# Patient Record
Sex: Female | Born: 1975 | Hispanic: Yes | Marital: Single | Smoking: Never smoker
Health system: Southern US, Community
[De-identification: ages and names within clinical notes are randomized; demographics above are authoritative.]

## PROBLEM LIST (undated history)

## (undated) DIAGNOSIS — F419 Anxiety disorder, unspecified: Secondary | ICD-10-CM

## (undated) DIAGNOSIS — M1990 Unspecified osteoarthritis, unspecified site: Secondary | ICD-10-CM

## (undated) DIAGNOSIS — I499 Cardiac arrhythmia, unspecified: Secondary | ICD-10-CM

## (undated) DIAGNOSIS — I4891 Unspecified atrial fibrillation: Secondary | ICD-10-CM

## (undated) DIAGNOSIS — F32A Depression, unspecified: Secondary | ICD-10-CM

## (undated) DIAGNOSIS — C801 Malignant (primary) neoplasm, unspecified: Secondary | ICD-10-CM

## (undated) HISTORY — PX: BREAST SURGERY: SHX581

## (undated) NOTE — *Deleted (*Deleted)
19 year old married female, resident of [place], [place], with PMH of A. fib/SVT, s/p ablations, not on anticoagulation, breast cancer on hormonal therapy with Aromasin and Lupron, rheumatoid arthritis on regular NSAIDs, presented to the [facility] emergency department on 02/18/2020 due to abrupt onset of syncope and confusion/altered mental status. On day of the event, she had not eaten a lot and was really hungry so she went to cook.  She reported a sense of nervousness which wass not unusual for her when she is hungry.  She endorsed that this sense of nervousness kind of grew more and the next thing she knows is waking up in the hospital.  Husband reported that he heard her singing and it is not unusual for her to sing.  However, her voice was really loud and it kept on getting louder and louder almost to the point of yelling.  Patient was heard rambling, talking that did not make any sense and then around 6 PM, he heard a loud thud and both he and the son found her on the ground passed out.  She was unconscious for about 2 minutes, they did not see any shaking or jerking concerning for seizures.  She gradually started coming back but she was very disoriented and lethargic afterwards.  He offered her some food to eat and when she continued to be lethargic, he called EMS.  She did not have any bowel or bladder incontinence during this event however, she did lose control of her bladder a couple times in the hospital when she was lethargic and tired.  Upon arrival to the ED, she reported to the [facility] physician regarding vertigo/room spinning around her, nausea and headache on the right side of her head which was throbbing, constant and rated at 8/10 in severity.  She had vomited a couple times prior to admission and had urinary incontinence.  Stroke code was initiated by EDP.  Neurology consulted.  CT of the head and CTA of the head and neck were without acute findings.  Her mental  status improved may be after 4 hours.  She was given IV Toradol and Reglan for headache, IV fluids and meclizine for vertigo.  Neurology recommended metabolic work-up and MRI of the brain.  Work-up in the ED showed stable vital signs, CMP remarkable for glucose 67 and CBC for white cell count of 3.9.  COVID-19 and flu panel PCR were negative.  Blood alcohol level was less than 10.  EKG showed sinus rhythm.  She was admitted with a diagnosis of syncope and collapse, metabolic encephalopathy, A. fib and headache, for further evaluation and management.  Following was the evaluation and management done during course of her hospitalization:  Syncope and collapse: Etiology unclear.  No evidence to suggest cardiogenic etiology.  Echocardiogram and EKG unremarkable.  Telemetry showed sinus rhythm.  Neurology followed up and felt that there were certain aspects of the semiology of this event that were more concerning for a seizure including possible prodrome of overwhelming nervousness, prolonged postictal confusion.  But she had also not eaten much that day and husband reported some improvement in cognition after he gave her some food after the episode and that she was very hungry.  MRI brain and routine EEG were negative for any structural abnormality or for any epileptogenic discharges.  Since there is suspicion for her having a seizure in the future was low, they recommended holding off on starting her on antiepileptic drugs at this time.  Given her risk factors  though including significant family history of epilepsy, episodes of staring off as a kid that had not been worked up, they felt that she would benefit from seeing a neurologist as outpatient.  They also recommended discussing with her cardiologist and oncologist about taking anticoagulation for stroke prevention from A. fib and advised no driving for 6 months. TSH and cortisol unremarkable.  Towards the later part of her hospitalization, she was treated for  possible orthostatic hypotension related brief syncope, with IV fluids and reducing dose of propranolol for her A. fib.  With better control of her frontal headaches, dizziness with multiple medication adjustments as noted below, she was able to walk better with therapies, clinically improved and eventually discharged home on 02/29/2020  Vertigo: Etiology unclear.  Suspected to be peripheral.  Imaging unremarkable.  PT evaluated.  Patient had difficulty participating in therapy evaluation due to vertigo.  Trial of meclizine initiated.  She had ongoing issues with severe vertigo, poor appetite and headache.  She had positional vertigo, when she was stationary she did not have dizziness but the moment she sat up, or turned side-to-side, she started having significant vertigo, ongoing frontal headache 7/10 in severity with significant phonophobia and photophobia (had her room dark).  Meclizine did not seem to help.  She had ongoing issues participating in therapies evaluation due to severe dizziness and headache.  Acute encephalopathy: Etiology unclear.  Metabolic and imaging work-up unremarkable.  Could not rule out postictal phenomena.  Resolved..  Paroxysmal atrial fibrillation: Telemetry showed normal sinus rhythm.  She was continued on home dose of propranolol.  Although blood pressures were soft at times, she did not have consistent orthostatic hypotension  Headache: Imaging negative.  No concerning features.  Treated with analgesics.  She required periodic doses of IV ketorolac and Reglan.  She was treated with a course of Augmentin empirically for sinusitis.  Her headache had some migrainous etiology.  She had hardly any response to a cocktail of Toradol/Reglan/Benadryl and no response to IV Decadron either. Due to refractory headache, after discussing with neurology, CT venogram of the brain obtained and ruled out dural sinus venous thrombosis.  As per neurology recommendations, she was then treated  with couple of days of IV Depacon and IV Solu-Medrol along with Topamax at night.  Neurology formally consulted again on 02/26/2020 and were concerned about new migraines and recommended Thorazine x1 day, sumatriptan subcutaneous, Compazine, continue Ambien and trazodone for insomnia, continue meclizine as needed, increased Topamax.  She had another brief episode of lightheadedness and passing out on 02/27/2020 without orthostatic hypotension but did have some orthostatic drop in heart rates.  Trazodone was discontinued.  Headache improved  2.7 cm left thyroid nodule: Recommended thyroid ultrasound and follow-up as outpatient.  TSH was normal.  Heterogenous appearance of the salivary gland: With numerous punctate parotid calcifications which may reflect an underlying inflammatory or autoimmune condition such as Sjogren's disease.  Outpatient follow-up.  Constipation: No concerning features.  Bowel regimen initiated with good effect.  History of breast cancer: S/p double mastectomy, maintained on Aromasin and Lupron.  Apparently had not been taking Lupron in the past several months.  Plan was to establish with outpatient oncology as patient had just moved into this area from [place].   Several days after patient was discharged from the hospital, her insurance has denied this hospital admission.  As per denial fax, "The service requested in the [place] is available in [place] therefore, it is denied as established in your health  coverage".  There was no option to do up peer to peer discussion with the Manufacturing engineer.  The only option available was to write a written appeal.  This is a written appeal requesting the insurance company to review patient's most recent hospitalization as detailed above and strongly recommend overturning the insurance denial and approving inpatient admission which was most appropriate for reasons outlined below:   1. Patient is currently a  resident of [place], [place] and not [place] as indicated in the denial fax. 2. Patient voluntarily presented to the local emergency department at the [facility] in [place], [place] seeking medical care for her severe medical conditions. 3. She was appropriately evaluated in a timely fashion in the ED and hospitalized for further evaluation and management of her several serious medical problems as detailed above.  She clearly met medical intensity for inpatient hospitalization. 4. Refusing to provide medical care to her obviously was not an option.  This would have clearly violated EMTALA.

---

## 2015-05-06 HISTORY — PX: BREAST SURGERY: SHX581

## 2017-05-05 HISTORY — PX: ABLATION: SHX5711

## 2019-01-21 ENCOUNTER — Telehealth (HOSPITAL_BASED_OUTPATIENT_CLINIC_OR_DEPARTMENT_OTHER): Payer: Self-pay

## 2019-01-21 NOTE — Telephone Encounter (Signed)
New pt is interested in scheduling a consult with BREAST MED ONC for her Dx of breast cancer. I informed her to have her Dr's office fax over medical records/ referral to [phone]. Please keep an eye out for documents and schedule when ready. Thank you.

## 2019-01-28 NOTE — Telephone Encounter (Signed)
No records have been received as of 01/28/19.  Also, per pt's registration, pt has a Medicare HMO ([employer] With billing address in [place]).  I have made several attempts to reach someone in [employer] office to verify if we accept pt's plan.  Verified on 01/28/19 that [facility] is not contracted with this Medicare HMO plan.    Call placed to pt to discuss the above information, but no answer.  Left vm requesting call back from pt to discuss further.  Ph number given to pt.

## 2019-04-18 ENCOUNTER — Encounter (HOSPITAL_BASED_OUTPATIENT_CLINIC_OR_DEPARTMENT_OTHER): Payer: Self-pay | Admitting: Surgical Oncology

## 2019-04-18 ENCOUNTER — Telehealth (HOSPITAL_BASED_OUTPATIENT_CLINIC_OR_DEPARTMENT_OTHER): Payer: Self-pay

## 2019-04-18 NOTE — Telephone Encounter (Signed)
Referral received for breast carcinoma, patient has had mastectomy.     Referral scanned into media.     Patient being directed to med onc

## 2019-04-19 NOTE — Telephone Encounter (Signed)
Patient has been referred by  PCP- [facility]  to breast medical oncology.    DX: Hx of Breast Cancer    Ref Date: 04/18/19    Authorization: *NO AUTH INCLUDED*      *2ND REFERRAL/NEW PATIENT ENCOUNTER:    Previous encounter started on 01/21/19.  Previous issue with scheduling was due to patient's insurance.  Patient stil has [employer] HMO (Medicare HMO with billing address in [place]).  Per [employer] Dept, we are not contracted with this insurance.    Pt also enrolled with [employer], but Medicare HMO plan is Primary.    Call placed to pt to discuss referral and current insurance, but no answer from pt.  Left vm request call back to discuss referral details.  Ph number provided.

## 2020-02-18 ENCOUNTER — Inpatient Hospital Stay (HOSPITAL_COMMUNITY)
Admission: EM | Admit: 2020-02-18 | Discharge: 2020-02-29 | DRG: 312 | Disposition: A | Discharge status: Home / Self Care | Payer: Medicare (Managed Care) | Attending: Internal Medicine | Admitting: Internal Medicine

## 2020-02-18 ENCOUNTER — Emergency Department (HOSPITAL_COMMUNITY): Payer: Medicare (Managed Care)

## 2020-02-18 ENCOUNTER — Encounter (HOSPITAL_COMMUNITY): Payer: Self-pay

## 2020-02-18 ENCOUNTER — Other Ambulatory Visit: Payer: Self-pay

## 2020-02-18 DIAGNOSIS — E041 Nontoxic single thyroid nodule: Secondary | ICD-10-CM | POA: Diagnosis present

## 2020-02-18 DIAGNOSIS — G9340 Encephalopathy, unspecified: Secondary | ICD-10-CM | POA: Diagnosis not present

## 2020-02-18 DIAGNOSIS — M1990 Unspecified osteoarthritis, unspecified site: Secondary | ICD-10-CM | POA: Diagnosis present

## 2020-02-18 DIAGNOSIS — R569 Unspecified convulsions: Secondary | ICD-10-CM | POA: Diagnosis present

## 2020-02-18 DIAGNOSIS — E860 Dehydration: Secondary | ICD-10-CM | POA: Diagnosis present

## 2020-02-18 DIAGNOSIS — R911 Solitary pulmonary nodule: Secondary | ICD-10-CM | POA: Diagnosis present

## 2020-02-18 DIAGNOSIS — M069 Rheumatoid arthritis, unspecified: Secondary | ICD-10-CM | POA: Diagnosis present

## 2020-02-18 DIAGNOSIS — C50919 Malignant neoplasm of unspecified site of unspecified female breast: Secondary | ICD-10-CM | POA: Diagnosis present

## 2020-02-18 DIAGNOSIS — K5900 Constipation, unspecified: Secondary | ICD-10-CM | POA: Diagnosis present

## 2020-02-18 DIAGNOSIS — R42 Dizziness and giddiness: Secondary | ICD-10-CM | POA: Diagnosis not present

## 2020-02-18 DIAGNOSIS — Z79811 Long term (current) use of aromatase inhibitors: Secondary | ICD-10-CM | POA: Diagnosis not present

## 2020-02-18 DIAGNOSIS — I4891 Unspecified atrial fibrillation: Secondary | ICD-10-CM

## 2020-02-18 DIAGNOSIS — R410 Disorientation, unspecified: Secondary | ICD-10-CM | POA: Diagnosis not present

## 2020-02-18 DIAGNOSIS — Z9181 History of falling: Secondary | ICD-10-CM

## 2020-02-18 DIAGNOSIS — F419 Anxiety disorder, unspecified: Secondary | ICD-10-CM | POA: Diagnosis not present

## 2020-02-18 DIAGNOSIS — Z20822 Contact with and (suspected) exposure to covid-19: Secondary | ICD-10-CM | POA: Diagnosis present

## 2020-02-18 DIAGNOSIS — R519 Headache, unspecified: Secondary | ICD-10-CM

## 2020-02-18 DIAGNOSIS — G4489 Other headache syndrome: Secondary | ICD-10-CM | POA: Diagnosis not present

## 2020-02-18 DIAGNOSIS — R55 Syncope and collapse: Secondary | ICD-10-CM | POA: Diagnosis not present

## 2020-02-18 DIAGNOSIS — G4700 Insomnia, unspecified: Secondary | ICD-10-CM | POA: Diagnosis present

## 2020-02-18 DIAGNOSIS — R4182 Altered mental status, unspecified: Secondary | ICD-10-CM

## 2020-02-18 DIAGNOSIS — Z9013 Acquired absence of bilateral breasts and nipples: Secondary | ICD-10-CM | POA: Diagnosis not present

## 2020-02-18 DIAGNOSIS — I951 Orthostatic hypotension: Principal | ICD-10-CM | POA: Diagnosis present

## 2020-02-18 DIAGNOSIS — G9341 Metabolic encephalopathy: Secondary | ICD-10-CM | POA: Diagnosis present

## 2020-02-18 DIAGNOSIS — I480 Paroxysmal atrial fibrillation: Secondary | ICD-10-CM | POA: Diagnosis present

## 2020-02-18 DIAGNOSIS — R32 Unspecified urinary incontinence: Secondary | ICD-10-CM | POA: Diagnosis present

## 2020-02-18 DIAGNOSIS — Z820 Family history of epilepsy and other diseases of the nervous system: Secondary | ICD-10-CM | POA: Diagnosis not present

## 2020-02-18 DIAGNOSIS — Z79899 Other long term (current) drug therapy: Secondary | ICD-10-CM | POA: Diagnosis not present

## 2020-02-18 HISTORY — DX: Malignant (primary) neoplasm, unspecified: C80.1

## 2020-02-18 HISTORY — DX: Unspecified osteoarthritis, unspecified site: M19.90

## 2020-02-18 HISTORY — DX: Unspecified atrial fibrillation: I48.91

## 2020-02-18 LAB — CBC
HCT: 41.4 % (ref 36.0–46.0)
Hemoglobin: 13.5 g/dL (ref 12.0–15.0)
MCH: 26.8 pg (ref 26.0–34.0)
MCHC: 32.6 g/dL (ref 30.0–36.0)
MCV: 82.3 fL (ref 80.0–100.0)
Platelets: 212 10*3/uL (ref 150–400)
RBC: 5.03 MIL/uL (ref 3.87–5.11)
RDW: 13.1 % (ref 11.5–15.5)
WBC: 3.9 10*3/uL — ABNORMAL LOW (ref 4.0–10.5)
nRBC: 0 % (ref 0.0–0.2)

## 2020-02-18 LAB — RESPIRATORY PANEL BY RT PCR (FLU A&B, COVID)
Influenza A by PCR: NEGATIVE
Influenza B by PCR: NEGATIVE
SARS Coronavirus 2 by RT PCR: NEGATIVE

## 2020-02-18 LAB — COMPREHENSIVE METABOLIC PANEL
ALT: 17 U/L (ref 0–44)
AST: 26 U/L (ref 15–41)
Albumin: 4.2 g/dL (ref 3.5–5.0)
Alkaline Phosphatase: 54 U/L (ref 38–126)
Anion gap: 6 (ref 5–15)
BUN: 12 mg/dL (ref 6–20)
CO2: 26 mmol/L (ref 22–32)
Calcium: 9.1 mg/dL (ref 8.9–10.3)
Chloride: 103 mmol/L (ref 98–111)
Creatinine, Ser: 1 mg/dL (ref 0.44–1.00)
GFR, Estimated: >60 mL/min (ref >60)
Glucose, Bld: 67 mg/dL — ABNORMAL LOW (ref 70–99)
Potassium: 3.5 mmol/L (ref 3.5–5.1)
Sodium: 135 mmol/L (ref 135–145)
Total Bilirubin: 0.6 mg/dL (ref 0.3–1.2)
Total Protein: 9.2 g/dL — ABNORMAL HIGH (ref 6.5–8.1)

## 2020-02-18 LAB — DIFFERENTIAL
Abs Immature Granulocytes: 0.01 10*3/uL (ref 0.00–0.07)
Basophils Absolute: 0 10*3/uL (ref 0.0–0.1)
Basophils Relative: 1 %
Eosinophils Absolute: 0 10*3/uL (ref 0.0–0.5)
Eosinophils Relative: 1 %
Immature Granulocytes: 0 %
Lymphocytes Relative: 39 %
Lymphs Abs: 1.5 10*3/uL (ref 0.7–4.0)
Monocytes Absolute: 0.3 10*3/uL (ref 0.1–1.0)
Monocytes Relative: 8 %
Neutro Abs: 2 10*3/uL (ref 1.7–7.7)
Neutrophils Relative %: 51 %

## 2020-02-18 LAB — ETHANOL: Alcohol, Ethyl (B): <10 mg/dL (ref <10)

## 2020-02-18 LAB — CBG MONITORING, ED: Glucose-Capillary: 93 mg/dL (ref 70–99)

## 2020-02-18 LAB — PROTIME-INR
INR: 1 (ref 0.8–1.2)
Prothrombin Time: 13 seconds (ref 11.4–15.2)

## 2020-02-18 LAB — APTT: aPTT: 26 seconds (ref 24–36)

## 2020-02-18 MED ORDER — METOCLOPRAMIDE HCL 5 MG/ML IJ SOLN
10.0000 mg | Freq: Once | INTRAMUSCULAR | Status: AC
  Administered 2020-02-18: 10 mg via INTRAVENOUS
  Filled 2020-02-18: qty 2

## 2020-02-18 MED ORDER — PROPRANOLOL HCL 40 MG PO TABS
40.0000 mg | ORAL_TABLET | Freq: Every day | ORAL | Status: DC
  Administered 2020-02-20 – 2020-02-24 (×5): 40 mg via ORAL
  Filled 2020-02-18 (×6): qty 1

## 2020-02-18 MED ORDER — KETOROLAC TROMETHAMINE 15 MG/ML IJ SOLN
15.0000 mg | Freq: Once | INTRAMUSCULAR | Status: AC
  Administered 2020-02-18: 15 mg via INTRAVENOUS
  Filled 2020-02-18: qty 1

## 2020-02-18 MED ORDER — ONDANSETRON HCL 4 MG/2ML IJ SOLN
4.0000 mg | Freq: Three times a day (TID) | INTRAMUSCULAR | Status: DC | PRN
  Administered 2020-02-19 – 2020-02-23 (×4): 4 mg via INTRAVENOUS
  Filled 2020-02-18 (×4): qty 2

## 2020-02-18 MED ORDER — BUSPIRONE HCL 15 MG PO TABS
7.5000 mg | ORAL_TABLET | Freq: Every day | ORAL | Status: DC
  Administered 2020-02-19 – 2020-02-29 (×11): 7.5 mg via ORAL
  Filled 2020-02-18 (×2): qty 1
  Filled 2020-02-18: qty 2
  Filled 2020-02-18 (×8): qty 1

## 2020-02-18 MED ORDER — ACETAMINOPHEN 650 MG RE SUPP: 650.0000 mg | RECTAL | Status: DC | PRN

## 2020-02-18 MED ORDER — EXEMESTANE 25 MG PO TABS
25.0000 mg | ORAL_TABLET | Freq: Every day | ORAL | Status: DC
  Administered 2020-02-19 – 2020-02-29 (×11): 25 mg via ORAL
  Filled 2020-02-18 (×14): qty 1

## 2020-02-18 MED ORDER — HEPARIN SODIUM (PORCINE) 5000 UNIT/ML IJ SOLN
5000.0000 [IU] | Freq: Three times a day (TID) | INTRAMUSCULAR | Status: DC
  Administered 2020-02-19 – 2020-02-29 (×30): 5000 [IU] via SUBCUTANEOUS
  Filled 2020-02-18 (×30): qty 1

## 2020-02-18 MED ORDER — SODIUM CHLORIDE 0.9 % IV BOLUS
500.0000 mL | Freq: Once | INTRAVENOUS | Status: AC
  Administered 2020-02-18: 500 mL via INTRAVENOUS

## 2020-02-18 MED ORDER — SODIUM CHLORIDE 0.9 % IV SOLN: INTRAVENOUS | Status: DC

## 2020-02-18 MED ORDER — ACETAMINOPHEN 160 MG/5ML PO SOLN: 650.0000 mg | ORAL | Status: DC | PRN

## 2020-02-18 MED ORDER — TRAZODONE HCL 100 MG PO TABS
100.0000 mg | ORAL_TABLET | Freq: Every day | ORAL | Status: DC
  Administered 2020-02-19 – 2020-02-26 (×6): 100 mg via ORAL
  Filled 2020-02-18 (×6): qty 1

## 2020-02-18 MED ORDER — ACETAMINOPHEN 325 MG PO TABS
650.0000 mg | ORAL_TABLET | ORAL | Status: DC | PRN
  Administered 2020-02-20 – 2020-02-28 (×6): 650 mg via ORAL
  Filled 2020-02-18 (×7): qty 2

## 2020-02-18 MED ORDER — IOHEXOL 350 MG/ML SOLN
75.0000 mL | Freq: Once | INTRAVENOUS | Status: AC | PRN
  Administered 2020-02-18: 75 mL via INTRAVENOUS

## 2020-02-18 MED ORDER — MORPHINE SULFATE (PF) 2 MG/ML IV SOLN
2.0000 mg | INTRAVENOUS | Status: DC | PRN
  Administered 2020-02-19 – 2020-02-20 (×3): 2 mg via INTRAVENOUS
  Filled 2020-02-18 (×3): qty 1

## 2020-02-18 MED ORDER — MECLIZINE HCL 12.5 MG PO TABS
25.0000 mg | ORAL_TABLET | Freq: Once | ORAL | Status: AC
  Administered 2020-02-18: 25 mg via ORAL
  Filled 2020-02-18: qty 2

## 2020-02-18 MED ORDER — STROKE: EARLY STAGES OF RECOVERY BOOK
Freq: Once | Status: AC
  Filled 2020-02-18: qty 1

## 2020-02-18 NOTE — ED Notes (Signed)
This RN rounded on patient, found patient to be resting in bed with no signs of distress. Pt appears alert, disoriented to place and situation. Bed is locked in the lowest position, side rails x2, call bell within reach and family at bedside. This RN educated that patient had x2 episode of loss of bladder prior to arrival to room for triage and x1 episode now. Pt rolled and changed into clean sheets, chuck pad, draw sheet at this time.

## 2020-02-18 NOTE — ED Provider Notes (Signed)
[facility] EMERGENCY DEPARTMENT Provider Note   CSN: [id number] Arrival date & time: 02/18/20  1819     History Chief Complaint  Patient presents with  . Loss of Consciousness    April Acevedo is a 44 y.o. female with history of A. fib, cancer, present emerge department abrupt onset vertigo and syncope.  The patient is disoriented and dazed upon arrival.  Her husband provides the history at the bedside.  He reports that she woke up in her usual state of health today and was doing well.  Around 6 PM while she was cooking for dinner, he heard her singing, and her singing "became really shrill," and he heard a thud. They ran downstairs and found her passed out on the floor.  His husband states their son found her first and she was "unconscious for a few minutes."  He said the following that she seemed very dazed and confused.  She woke up with vertigo.  Upon arrival the patient tells me she feels like the room is spinning around her and she feels nauseous.  She reports a headache on the right side of her head that is throbbing, constant, and different than her prior headaches.  It is 8/10 pain.  Her husband tells me that she does have a family history of brain aneurysm.  He says that she is currently on treatment for breast cancer.  He also reports that she has been on propanolol for A. fib.  He says she has a hx of "A Fib and SVT and has had heart ablations."  She was formerly on aspirin but taken off it in the past.  She is not on [facility] currently.  They moved recently from [place] and do not have doctors in the area yet.    HPI     Past Medical History:  Diagnosis Date  . Arthritis   . Atrial fibrillation ([place])   . Cancer [facility])     Patient Active Problem List   Diagnosis Date Noted  . Syncope 02/18/2020    Past Surgical History:  Procedure Laterality Date  . BREAST SURGERY       OB History   No obstetric history on file.     History reviewed. No pertinent  family history.  Social History   Tobacco Use  . Smoking status: Never Smoker  . Smokeless tobacco: Never Used  Vaping Use  . Vaping Use: Never used  Substance Use Topics  . Alcohol use: Yes  . Drug use: Never    Home Medications Prior to Admission medications   Medication Sig Start Date End Date Taking? Authorizing Provider  busPIRone (BUSPAR) 7.5 MG tablet Take 7.5 mg by mouth daily.   Yes [provider]  exemestane (AROMASIN) 25 MG tablet Take 25 mg by mouth daily after breakfast.   Yes [provider]  ibuprofen (ADVIL) 800 MG tablet Take 800 mg by mouth every 8 (eight) hours as needed.   Yes [provider]  leuprolide (LUPRON) 7.5 MG injection Inject 7.5 mg into the muscle every 28 (twenty-eight) days.    Yes [provider]  LORazepam (ATIVAN) 2 MG tablet Take 2 mg by mouth daily.   Yes [provider]  propranolol (INDERAL) 40 MG tablet Take 40 mg by mouth daily.    Yes [provider]  traZODone (DESYREL) 100 MG tablet Take 100 mg by mouth at bedtime.   Yes [provider]    Allergies    Patient has no  known allergies.  Review of Systems   Review of Systems  Constitutional: Negative for chills and fever.  Eyes: Positive for visual disturbance. Negative for pain.  Respiratory: Negative for cough and shortness of breath.   Cardiovascular: Negative for chest pain and palpitations.  Gastrointestinal: Positive for nausea. Negative for abdominal pain and vomiting.  Genitourinary: Negative for dysuria and hematuria.  Musculoskeletal: Negative for arthralgias and back pain.  Skin: Negative for pallor and rash.  Neurological: Positive for dizziness, syncope, light-headedness and headaches.  Psychiatric/Behavioral: Negative for agitation and confusion.  All other systems reviewed and are negative.   Physical Exam Updated Vital Signs BP 111/72   Pulse 60   Temp 98.2 F (36.8 C) (Oral)   Resp 15   Ht 5'  5" (1.651 m)   Wt 54.4 kg   SpO2 100%   BMI 19.97 kg/m   Physical Exam Vitals and nursing note reviewed.  Constitutional:      Appearance: She is well-developed.     Comments: Appears fatigued, dry-heaving, vertiginous with movement  HENT:     Head: Normocephalic and atraumatic.  Eyes:     Conjunctiva/sclera: Conjunctivae normal.  Cardiovascular:     Rate and Rhythm: Normal rate and regular rhythm.     Pulses: Normal pulses.  Pulmonary:     Effort: Pulmonary effort is normal. No respiratory distress.     Breath sounds: Normal breath sounds.  Abdominal:     Palpations: Abdomen is soft.     Tenderness: There is no abdominal tenderness.  Musculoskeletal:     Cervical back: Neck supple.  Skin:    General: Skin is warm and dry.  Neurological:     Mental Status: She is alert.     GCS: GCS eye subscore is 4. GCS verbal subscore is 5. GCS motor subscore is 6.     Cranial Nerves: No dysarthria or facial asymmetry.     Sensory: Sensation is intact.     Motor: Motor function is intact.     Coordination: Coordination abnormal. Finger-Nose-Finger Test abnormal.     Comments: Horizontal nystagmus     ED Results / Procedures / Treatments   Labs (all labs ordered are listed, but only abnormal results are displayed) Labs Reviewed  CBC - Abnormal; Notable for the following components:      Result Value   WBC 3.9 (*)    All other components within normal limits  COMPREHENSIVE METABOLIC PANEL - Abnormal; Notable for the following components:   Glucose, Bld 67 (*)    Total Protein 9.2 (*)    All other components within normal limits  RESPIRATORY PANEL BY RT PCR (FLU A&B, COVID)  ETHANOL  PROTIME-INR  APTT  DIFFERENTIAL  RAPID URINE DRUG SCREEN, HOSP PERFORMED  URINALYSIS, ROUTINE W REFLEX MICROSCOPIC  HIV ANTIBODY (ROUTINE TESTING W REFLEX)  HEMOGLOBIN A1C  LIPID PANEL  CBC  COMPREHENSIVE METABOLIC PANEL  CBG MONITORING, ED  I-STAT CHEM 8, ED  POC URINE PREG, ED     EKG EKG Interpretation  Date/Time:  Saturday February 18 2020 18:28:36 EDT Ventricular Rate:  85 PR Interval:    QRS Duration: 102 QT Interval:  377 QTC Calculation: 449 R Axis:   78 Text Interpretation: Sinus rhythm RSR' in V1 or V2, right VCD or RVH No STEMI Confirmed by Octaviano Glow [phone]) on 02/18/2020 7:06:17 PM   Radiology CT Angio Head W or Wo Contrast  Addendum Date: 02/18/2020   ADDENDUM REPORT: 02/18/2020 20:52 ADDENDUM: 3 mm left  upper lobe lung nodule. No follow-up needed if patient is low-risk. Non-contrast chest CT can be considered in 12 months if patient is high-risk. This recommendation follows the consensus statement: Guidelines for Management of Incidental Pulmonary Nodules Detected on CT Images: From the Fleischner Society 2017; Radiology 2017; 284:228-243. Electronically Signed   By: Logan Bores M.D.   On: 02/18/2020 20:52   Result Date: 02/18/2020 CLINICAL DATA:  Altered mental status. Nausea and vomiting. Acute onset headache and vertigo. Family history of subarachnoid hemorrhage. EXAM: CT ANGIOGRAPHY HEAD AND NECK TECHNIQUE: Multidetector CT imaging of the head and neck was performed using the standard protocol during bolus administration of intravenous contrast. Multiplanar CT image reconstructions and MIPs were obtained to evaluate the vascular anatomy. Carotid stenosis measurements (when applicable) are obtained utilizing NASCET criteria, using the distal internal carotid diameter as the denominator. CONTRAST:  45mL OMNIPAQUE IOHEXOL 350 MG/ML SOLN COMPARISON:  None. FINDINGS: CTA NECK FINDINGS Aortic arch: Standard 3 vessel aortic arch with widely patent arch vessel origins. Right carotid system: Patent and smooth without evidence of stenosis or dissection. Left carotid system: Patent and smooth without evidence of stenosis or dissection. Vertebral arteries: Patent and smooth without evidence of stenosis or dissection. Moderately dominant left vertebral  artery. Skeleton: No acute osseous abnormality or suspicious osseous lesion. Other neck: Asymmetric heterogeneity and enlargement of the left thyroid lobe with a 2.7 cm exophytic nodule extending off the left lower pole. Heterogeneous appearance of the parotid and submandibular glands bilaterally with numerous punctate parotid calcifications bilaterally. Increased number of prominent but subcentimeter lymph nodes in levels I and II bilaterally measuring up to 8 mm in short axis. Upper chest: Mild biapical pleuroparenchymal lung scarring. 3 mm left upper lobe lung nodule. Bilateral breast implants. Review of the MIP images confirms the above findings CTA HEAD FINDINGS Anterior circulation: The internal carotid arteries are widely patent from skull base to carotid termini. ACAs and MCAs are patent without evidence of a proximal branch occlusion or significant proximal stenosis. No aneurysm is identified. Posterior circulation: The intracranial vertebral arteries are widely patent to the basilar. Patent PICA, AICA, and SCA origins are seen bilaterally. There are small to moderate-sized right and diminutive or absent left posterior communicating arteries. PCAs are patent without evidence of a significant proximal stenosis. No aneurysm is identified. Venous sinuses: Patent. Anatomic variants: None. Review of the MIP images confirms the above findings IMPRESSION: 1. No large vessel occlusion, significant stenosis, or aneurysm in the head and neck. 2. 2.7 cm left thyroid nodule. Recommend thyroid US (ref: J Am Coll Radiol. 2015 Feb;12(2): 143-50). 3. Heterogeneous appearance of the salivary glands with numerous punctate parotid calcifications which may reflect an underlying inflammatory or autoimmune conditions such as Sjogren disease. 4. Borderline upper cervical lymphadenopathy, nonspecific though may be reactive or related to a systemic inflammatory condition. Electronically Signed: By: Logan Bores M.D. On: 02/18/2020  20:02   CT Angio Neck W and/or Wo Contrast  Addendum Date: 02/18/2020   ADDENDUM REPORT: 02/18/2020 20:52 ADDENDUM: 3 mm left upper lobe lung nodule. No follow-up needed if patient is low-risk. Non-contrast chest CT can be considered in 12 months if patient is high-risk. This recommendation follows the consensus statement: Guidelines for Management of Incidental Pulmonary Nodules Detected on CT Images: From the Fleischner Society 2017; Radiology 2017; 284:228-243. Electronically Signed   By: Logan Bores M.D.   On: 02/18/2020 20:52   Result Date: 02/18/2020 CLINICAL DATA:  Altered mental status. Nausea and vomiting. Acute onset headache  and vertigo. Family history of subarachnoid hemorrhage. EXAM: CT ANGIOGRAPHY HEAD AND NECK TECHNIQUE: Multidetector CT imaging of the head and neck was performed using the standard protocol during bolus administration of intravenous contrast. Multiplanar CT image reconstructions and MIPs were obtained to evaluate the vascular anatomy. Carotid stenosis measurements (when applicable) are obtained utilizing NASCET criteria, using the distal internal carotid diameter as the denominator. CONTRAST:  55mL OMNIPAQUE IOHEXOL 350 MG/ML SOLN COMPARISON:  None. FINDINGS: CTA NECK FINDINGS Aortic arch: Standard 3 vessel aortic arch with widely patent arch vessel origins. Right carotid system: Patent and smooth without evidence of stenosis or dissection. Left carotid system: Patent and smooth without evidence of stenosis or dissection. Vertebral arteries: Patent and smooth without evidence of stenosis or dissection. Moderately dominant left vertebral artery. Skeleton: No acute osseous abnormality or suspicious osseous lesion. Other neck: Asymmetric heterogeneity and enlargement of the left thyroid lobe with a 2.7 cm exophytic nodule extending off the left lower pole. Heterogeneous appearance of the parotid and submandibular glands bilaterally with numerous punctate parotid calcifications  bilaterally. Increased number of prominent but subcentimeter lymph nodes in levels I and II bilaterally measuring up to 8 mm in short axis. Upper chest: Mild biapical pleuroparenchymal lung scarring. 3 mm left upper lobe lung nodule. Bilateral breast implants. Review of the MIP images confirms the above findings CTA HEAD FINDINGS Anterior circulation: The internal carotid arteries are widely patent from skull base to carotid termini. ACAs and MCAs are patent without evidence of a proximal branch occlusion or significant proximal stenosis. No aneurysm is identified. Posterior circulation: The intracranial vertebral arteries are widely patent to the basilar. Patent PICA, AICA, and SCA origins are seen bilaterally. There are small to moderate-sized right and diminutive or absent left posterior communicating arteries. PCAs are patent without evidence of a significant proximal stenosis. No aneurysm is identified. Venous sinuses: Patent. Anatomic variants: None. Review of the MIP images confirms the above findings IMPRESSION: 1. No large vessel occlusion, significant stenosis, or aneurysm in the head and neck. 2. 2.7 cm left thyroid nodule. Recommend thyroid US (ref: J Am Coll Radiol. 2015 Feb;12(2): 143-50). 3. Heterogeneous appearance of the salivary glands with numerous punctate parotid calcifications which may reflect an underlying inflammatory or autoimmune conditions such as Sjogren disease. 4. Borderline upper cervical lymphadenopathy, nonspecific though may be reactive or related to a systemic inflammatory condition. Electronically Signed: By: Logan Bores M.D. On: 02/18/2020 20:02   CT HEAD CODE STROKE WO CONTRAST  Result Date: 02/18/2020 CLINICAL DATA:  Code stroke.  Passed out.  Vomiting today. EXAM: CT HEAD WITHOUT CONTRAST TECHNIQUE: Contiguous axial images were obtained from the base of the skull through the vertex without intravenous contrast. COMPARISON:  None. FINDINGS: Brain: No evidence of acute  infarction, hemorrhage, hydrocephalus, extra-axial collection or mass lesion/mass effect. Mild and incidental mineralization at the globus pallidus. Vascular: No hyperdense vessel or unexpected calcification. Skull: Normal. Negative for fracture or focal lesion. Sinuses/Orbits: Unspecific gaze to the right. Other: Attempted call report but physician is currently on the other line. Bilateral parotid calcifications. ASPECTS [facility] Stroke Program Early CT Score) Not scored with this history IMPRESSION: Normal appearance of the brain. Electronically Signed   By: Monte Fantasia M.D.   On: 02/18/2020 19:49    Procedures .Critical Care Performed by: Wyvonnia Dusky, MD Authorized by: Wyvonnia Dusky, MD   Critical care provider statement:    Critical care time (minutes):  40   Critical care was necessary to treat or prevent  imminent or life-threatening deterioration of the following conditions:  CNS failure or compromise   Critical care was time spent personally by me on the following activities:  Discussions with consultants, evaluation of patient's response to treatment, examination of patient, ordering and performing treatments and interventions, ordering and review of laboratory studies, ordering and review of radiographic studies, pulse oximetry, re-evaluation of patient's condition, obtaining history from patient or surrogate and review of old charts Comments:     Stroke alert - consideration of tPA - discussion with consultant   (including critical care time)  Medications Ordered in ED Medications  exemestane (AROMASIN) tablet 25 mg (has no administration in time range)  propranolol (INDERAL) tablet 40 mg (has no administration in time range)  busPIRone (BUSPAR) tablet 7.5 mg (has no administration in time range)  traZODone (DESYREL) tablet 100 mg (0 mg Oral Hold 02/19/20 0047)   stroke: mapping our early stages of recovery book (has no administration in time range)  0.9 %  sodium  chloride infusion ( Intravenous New Bag/Given 02/19/20 0047)  acetaminophen (TYLENOL) tablet 650 mg (has no administration in time range)    Or  acetaminophen (TYLENOL) 160 MG/5ML solution 650 mg (has no administration in time range)    Or  acetaminophen (TYLENOL) suppository 650 mg (has no administration in time range)  heparin injection 5,000 Units (0 Units Subcutaneous Hold 02/18/20 2344)  ondansetron (ZOFRAN) injection 4 mg (has no administration in time range)  morphine 2 MG/ML injection 2 mg (has no administration in time range)  iohexol (OMNIPAQUE) 350 MG/ML injection 75 mL (75 mLs Intravenous Contrast Given 02/18/20 1940)  ketorolac (TORADOL) 15 MG/ML injection 15 mg (15 mg Intravenous Given 02/18/20 2303)  metoCLOPramide (REGLAN) injection 10 mg (10 mg Intravenous Given 02/18/20 2303)  sodium chloride 0.9 % bolus 500 mL (0 mLs Intravenous Stopped 02/18/20 2342)  meclizine (ANTIVERT) tablet 25 mg (25 mg Oral Given 02/18/20 2305)    ED Course  I have reviewed the triage vital signs and the nursing notes.  Pertinent labs & imaging results that were available during my care of the patient were reviewed by me and considered in my medical decision making (see chart for details).  44 yo femalee w/ reported hx of "A Fib and SVT and ablations" on propanolol, but not AC, hx of breast cancer on hormonal therapy, presenting to the ED with acute onset of vertigo after an episode of syncope vs near-syncope.  She has a family hx of SAH and also presents with a right sided headache.    DDx includes arrhythmia vs stroke vs ICH vs anemia vs ACS vs BPPV vs other.  Based on timing of events and cerebellar ataxia, a stroke alert was called on my initial assessment.  Patient went for [facility] and CTA.  No evident lesion was found.  In discussion with neurologist, the decision was made not to give tPA at this time.  There was a recommendation for f/u MRI scan to evaluate for posterior stroke.  Upon return  from CT, patient had some improvement of overall mentation and symptoms.  Husband reported she was back to her baseline mental state.  She continued complaining to me of a headache, nausea, and vertigo with movement.  IV toradol and reglan was ordered for HA, IV fluids and meclizine for vertigo.    At this time, I believe this presentation may be peripheral vertigo, or a posterior infarct, or else an arrhythmia (A Fib with RVR), or transient hypoglycemia.  She would  benefit from an echo and MRI. This seems less likely a seizure with no tonic-clonic activity or history.  I personally reviewed her labs.  Accucheck normal on arrival.  CBC unremarkable.  Covid/flu negative.  No evidence of anemia.  CMP largely unremarkable - glucose 67, but I doubt this was symptomatic hypoglycemia, as she was more profoundly confused on arrival with normal POC here and by EMS.  ECG on arrival shows NSR, no evidence of arrhythmia per my interpretation  Supplemental history provided by her husband at bedside   Clinical Course as of Feb 19 103  Sat Feb 18, 2020  1919 Code stroke activated   [MT]  2250 Pt feeling better, still vertiginous, plan to admit to hospitalist for MRI and echocardiogram.   [MT]  2325 Admitted to hospitalist   [MT]    Clinical Course User Index [MT] Brihany Butch, Carola Rhine, MD    Final Clinical Impression(s) / ED Diagnoses Final diagnoses:  Syncope, unspecified syncope type  Altered mental status, unspecified altered mental status type    Rx / DC Orders ED Discharge Orders    None       Wyvonnia Dusky, MD 02/19/20 0105

## 2020-02-18 NOTE — ED Notes (Addendum)
Neurology on Telehealth at this time for Code Stroke evaluation. This RN informed by family at bedside that patient's LSN was roughly 53.  Dr. Langston Masker at bedside at this time.  Orders obtained by Neurology on Telehealth.  Pt being transported to CT at this time by this RN.   7:49 PM Pt arrived back to room from CT. Neurology on telehealth at this time. On arrival back to room, patient began to develop chest pain, pointed to mid-sternal and is grimacing, but unable to provide a 0-10 scale. New EKG obtained at this time. Vital signs remain within normal limits.

## 2020-02-18 NOTE — Consult Note (Addendum)
Reason for Consult:ams   CC: AMS   HPI: April Acevedo is an 44 y.o. female with hx of A-fib not on any anticoagulation but has had s/p ablation x2 . Last known well around 6 pm presents with somnolence, confusion and disorientation.   Past Medical History:  Diagnosis Date  . Arthritis   . Atrial fibrillation ([place])   . Cancer [facility])     Past Surgical History:  Procedure Laterality Date  . BREAST SURGERY      History reviewed. No pertinent family history.  Social History:  reports that she has never smoked. She has never used smokeless tobacco. She reports current alcohol use. She reports that she does not use drugs.  No Known Allergies    Physical Examination: Blood pressure 122/79, pulse 95, temperature 98.2 F (36.8 C), temperature source Oral, resp. rate 15, height 5\' 5"  (1.651 m), weight 54.4 kg, SpO2 100 %.  Pt is lethargic with generalized weakness, I don't see any clear focality on examination  She is able to tell me her name but not date or time Antigravity all extremities but slowly falling down.   Laboratory Studies:   Basic Metabolic Panel: Recent Labs  Lab 02/18/20 1910  NA 135  K 3.5  CL 103  CO2 26  GLUCOSE 67*  BUN 12  CREATININE 1.00  CALCIUM 9.1    Liver Function Tests: Recent Labs  Lab 02/18/20 1910  AST 26  ALT 17  ALKPHOS 54  BILITOT 0.6  PROT 9.2*  ALBUMIN 4.2   No results for input(s): LIPASE, AMYLASE in the last 168 hours. No results for input(s): AMMONIA in the last 168 hours.  CBC: Recent Labs  Lab 02/18/20 1910  WBC 3.9*  NEUTROABS 2.0  HGB 13.5  HCT 41.4  MCV 82.3  PLT 212    Cardiac Enzymes: No results for input(s): CKTOTAL, CKMB, CKMBINDEX, TROPONINI in the last 168 hours.  BNP: Invalid input(s): POCBNP  CBG: Recent Labs  Lab 02/18/20 1844  GLUCAP 93    Microbiology: No results found for this or any previous visit.  Coagulation Studies: Recent Labs    02/18/20 1910  LABPROT 13.0  INR 1.0     Urinalysis: No results for input(s): COLORURINE, LABSPEC, PHURINE, GLUCOSEU, HGBUR, BILIRUBINUR, KETONESUR, PROTEINUR, UROBILINOGEN, NITRITE, LEUKOCYTESUR in the last 168 hours.  Invalid input(s): APPERANCEUR  Lipid Panel:  No results found for: CHOL, TRIG, HDL, CHOLHDL, VLDL, LDLCALC  HgbA1C: No results found for: HGBA1C  Urine Drug Screen:  No results found for: LABOPIA, COCAINSCRNUR, LABBENZ, AMPHETMU, THCU, LABBARB  Alcohol Level:  Recent Labs  Lab 02/18/20 1910  ETH <10    Other results:   Imaging: No results found.   Assessment/Plan:  44 y/o F with LKW 6 PM progressive confusion/disorienation and worsening mentation. Husband denies any abnormalities today or her taking any extra medications - CTH I don't see any clear acute abnormalities - pt is lerthargic but has no clear focality will hold off TPA - CTA h/n done waiting for results - if negative CTA then admission and metabolic work up along with MRI in AM   Addendum CTA no LVO seen.   02/18/2020, 7:46 PM

## 2020-02-18 NOTE — ED Triage Notes (Signed)
Pt to er room number 1, husband with pt, per husband pt passed out earlier today, states that she isn't acting right, states that she has vomited twice and upon getting to the er voided on herself.  Pt states that she remembers having people feed her and then her husband telling her she needs to go to the er.  Pt states that she feels nauseated, pt oriented to place and person, doesn't know day of the week, re oriented pt.

## 2020-02-18 NOTE — ED Notes (Signed)
This RN at bedside during triage and assessment. Pt changed into a hospital gown and clean sheets, family at bedside. Bed is locked in the lowest position, side rails x2, call bell within reach. Continuous cardiac monitor in place and vital signs cycling q15 minutes. Dr. Langston Masker made aware of patient's arrival and condition at this time. Educated on hourly rounding and call light use, but husband verbalized understanding at this time. Will continue to monitor.

## 2020-02-18 NOTE — ED Triage Notes (Signed)
Husband states that pt was also rambling, talking that didn't make sense and singing really loud, states that she was talking about things that didn't make sense.  Stated that the holy spirit was making her sing loudly.

## 2020-02-19 ENCOUNTER — Inpatient Hospital Stay (HOSPITAL_COMMUNITY): Payer: Medicare (Managed Care)

## 2020-02-19 DIAGNOSIS — G9340 Encephalopathy, unspecified: Secondary | ICD-10-CM

## 2020-02-19 DIAGNOSIS — R55 Syncope and collapse: Secondary | ICD-10-CM | POA: Diagnosis not present

## 2020-02-19 DIAGNOSIS — R4182 Altered mental status, unspecified: Secondary | ICD-10-CM | POA: Insufficient documentation

## 2020-02-19 LAB — COMPREHENSIVE METABOLIC PANEL
ALT: 14 U/L (ref 0–44)
AST: 21 U/L (ref 15–41)
Albumin: 3.2 g/dL — ABNORMAL LOW (ref 3.5–5.0)
Alkaline Phosphatase: 45 U/L (ref 38–126)
Anion gap: 4 — ABNORMAL LOW (ref 5–15)
BUN: 12 mg/dL (ref 6–20)
CO2: 24 mmol/L (ref 22–32)
Calcium: 8.1 mg/dL — ABNORMAL LOW (ref 8.9–10.3)
Chloride: 110 mmol/L (ref 98–111)
Creatinine, Ser: 0.94 mg/dL (ref 0.44–1.00)
GFR, Estimated: >60 mL/min (ref >60)
Glucose, Bld: 121 mg/dL — ABNORMAL HIGH (ref 70–99)
Potassium: 3.7 mmol/L (ref 3.5–5.1)
Sodium: 138 mmol/L (ref 135–145)
Total Bilirubin: 0.3 mg/dL (ref 0.3–1.2)
Total Protein: 7.4 g/dL (ref 6.5–8.1)

## 2020-02-19 LAB — CBC
HCT: 34.7 % — ABNORMAL LOW (ref 36.0–46.0)
Hemoglobin: 11.5 g/dL — ABNORMAL LOW (ref 12.0–15.0)
MCH: 27.3 pg (ref 26.0–34.0)
MCHC: 33.1 g/dL (ref 30.0–36.0)
MCV: 82.4 fL (ref 80.0–100.0)
Platelets: 179 10*3/uL (ref 150–400)
RBC: 4.21 MIL/uL (ref 3.87–5.11)
RDW: 13.1 % (ref 11.5–15.5)
WBC: 3.1 10*3/uL — ABNORMAL LOW (ref 4.0–10.5)
nRBC: 0 % (ref 0.0–0.2)

## 2020-02-19 LAB — HIV ANTIBODY (ROUTINE TESTING W REFLEX): HIV Screen 4th Generation wRfx: NONREACTIVE

## 2020-02-19 LAB — ECHOCARDIOGRAM COMPLETE
Area-P 1/2: 2.74 cm2
Height: 65 in
S' Lateral: 2.37 cm
Weight: 1920 oz

## 2020-02-19 LAB — LIPID PANEL
Cholesterol: 129 mg/dL (ref 0–200)
HDL: 38 mg/dL — ABNORMAL LOW (ref >40)
LDL Cholesterol: 71 mg/dL (ref 0–99)
Total CHOL/HDL Ratio: 3.4 RATIO
Triglycerides: 101 mg/dL (ref <150)
VLDL: 20 mg/dL (ref 0–40)

## 2020-02-19 MED ORDER — GADOBUTROL 1 MMOL/ML IV SOLN
5.5000 mL | Freq: Once | INTRAVENOUS | Status: AC | PRN
  Administered 2020-02-19: 5.5 mL via INTRAVENOUS

## 2020-02-19 NOTE — Progress Notes (Signed)
ASSUMPTION OF CARE NOTE   02/19/2020 12:59 PM  April Acevedo was seen and examined.  The H&P by the admitting provider, orders, imaging was reviewed.  Please see new orders.  Pt awaiting transfer to [facility] for MRI, EEG, neuro.   Vitals:   02/19/20 1100 02/19/20 1200  BP: (!) 89/58 95/62  Pulse: 62 (!) 59  Resp: 19 14  Temp:    SpO2: 99% 98%    Results for orders placed or performed during the hospital encounter of 02/18/20  Respiratory Panel by RT PCR (Flu A&B, Covid) - Nasopharyngeal Swab   Specimen: Nasopharyngeal Swab  Result Value Ref Range   SARS Coronavirus 2 by RT PCR NEGATIVE NEGATIVE   Influenza A by PCR NEGATIVE NEGATIVE   Influenza B by PCR NEGATIVE NEGATIVE  Ethanol  Result Value Ref Range   Alcohol, Ethyl (B) <10 <10 mg/dL  Protime-INR  Result Value Ref Range   Prothrombin Time 13.0 11.4 - 15.2 seconds   INR 1.0 0.8 - 1.2  APTT  Result Value Ref Range   aPTT 26 24 - 36 seconds  CBC  Result Value Ref Range   WBC 3.9 (L) 4.0 - 10.5 K/uL   RBC 5.03 3.87 - 5.11 MIL/uL   Hemoglobin 13.5 12.0 - 15.0 g/dL   HCT 41.4 36 - 46 %   MCV 82.3 80.0 - 100.0 fL   MCH 26.8 26.0 - 34.0 pg   MCHC 32.6 30.0 - 36.0 g/dL   RDW 13.1 11.5 - 15.5 %   Platelets 212 150 - 400 K/uL   nRBC 0.0 0.0 - 0.2 %  Differential  Result Value Ref Range   Neutrophils Relative % 51 %   Neutro Abs 2.0 1.7 - 7.7 K/uL   Lymphocytes Relative 39 %   Lymphs Abs 1.5 0.7 - 4.0 K/uL   Monocytes Relative 8 %   Monocytes Absolute 0.3 0.1 - 1.0 K/uL   Eosinophils Relative 1 %   Eosinophils Absolute 0.0 0.0 - 0.5 K/uL   Basophils Relative 1 %   Basophils Absolute 0.0 0.0 - 0.1 K/uL   Immature Granulocytes 0 %   Abs Immature Granulocytes 0.01 0.00 - 0.07 K/uL  Comprehensive metabolic panel  Result Value Ref Range   Sodium 135 135 - 145 mmol/L   Potassium 3.5 3.5 - 5.1 mmol/L   Chloride 103 98 - 111 mmol/L   CO2 26 22 - 32 mmol/L   Glucose, Bld 67 (L) 70 - 99 mg/dL   BUN 12 6 - 20 mg/dL    Creatinine, Ser 1.00 0.44 - 1.00 mg/dL   Calcium 9.1 8.9 - 10.3 mg/dL   Total Protein 9.2 (H) 6.5 - 8.1 g/dL   Albumin 4.2 3.5 - 5.0 g/dL   AST 26 15 - 41 U/L   ALT 17 0 - 44 U/L   Alkaline Phosphatase 54 38 - 126 U/L   Total Bilirubin 0.6 0.3 - 1.2 mg/dL   GFR, Estimated >60 >60 mL/min   Anion gap 6 5 - 15  HIV Antibody (routine testing w rflx)  Result Value Ref Range   HIV Screen 4th Generation wRfx Non Reactive Non Reactive  Lipid panel  Result Value Ref Range   Cholesterol 129 0 - 200 mg/dL   Triglycerides 101 <150 mg/dL   HDL 38 (L) >40 mg/dL   Total CHOL/HDL Ratio 3.4 RATIO   VLDL 20 0 - 40 mg/dL   LDL Cholesterol 71 0 - 99 mg/dL  CBC  Result  Value Ref Range   WBC 3.1 (L) 4.0 - 10.5 K/uL   RBC 4.21 3.87 - 5.11 MIL/uL   Hemoglobin 11.5 (L) 12.0 - 15.0 g/dL   HCT 34.7 (L) 36 - 46 %   MCV 82.4 80.0 - 100.0 fL   MCH 27.3 26.0 - 34.0 pg   MCHC 33.1 30.0 - 36.0 g/dL   RDW 13.1 11.5 - 15.5 %   Platelets 179 150 - 400 K/uL   nRBC 0.0 0.0 - 0.2 %  Comprehensive metabolic panel  Result Value Ref Range   Sodium 138 135 - 145 mmol/L   Potassium 3.7 3.5 - 5.1 mmol/L   Chloride 110 98 - 111 mmol/L   CO2 24 22 - 32 mmol/L   Glucose, Bld 121 (H) 70 - 99 mg/dL   BUN 12 6 - 20 mg/dL   Creatinine, Ser 0.94 0.44 - 1.00 mg/dL   Calcium 8.1 (L) 8.9 - 10.3 mg/dL   Total Protein 7.4 6.5 - 8.1 g/dL   Albumin 3.2 (L) 3.5 - 5.0 g/dL   AST 21 15 - 41 U/L   ALT 14 0 - 44 U/L   Alkaline Phosphatase 45 38 - 126 U/L   Total Bilirubin 0.3 0.3 - 1.2 mg/dL   GFR, Estimated >60 >60 mL/min   Anion gap 4 (L) 5 - 15  CBG monitoring, ED  Result Value Ref Range   Glucose-Capillary 93 70 - 99 mg/dL  ECHOCARDIOGRAM COMPLETE  Result Value Ref Range   Weight 1,920 oz   Height 65 in   BP 91/69 mmHg   Murvin Natal, MD [facility]   02/18/2020  6:23 PM How to contact the [facility] Attending or Consulting provider [place] or covering provider during after hours 7P -7A, for this patient?   1. Check the care team in [facility] and look for a) attending/consulting [facility] provider listed and b) the [facility] team listed 2. Log into [email or web address] and use [place]'s universal password to access. If you do not have the password, please contact the hospital operator. 3. Locate the [facility] provider you are looking for under [facility] and page to a number that you can be directly reached. 4. If you still have difficulty reaching the provider, please page the [facility] (Director on Call) for the Hospitalists listed on amion for assistance.

## 2020-02-19 NOTE — H&P (Signed)
TRH H&P    Patient Demographics:    April Acevedo, is a 44 y.o. female  MRN: [id number]  DOB - 03-02-1976  Admit Date - 02/18/2020  Referring MD/NP/PA: Langston Masker  Outpatient Primary MD for the patient is Patient, No Pcp Per  Patient coming from: Home  Chief complaint- syncope   HPI:    April Acevedo  is a 44 y.o. female, with history of atrial fibrillation and breast cancer -currently still taking Aromasin and Lupron, presents to the ED with a chief complaint of syncope.  Patient does not remember the actual event.  She remembers cooking in the kitchen, and then being asked who the president is by one of the doctors or nurses.  Patient reports that while she was cooking she had no symptoms whatsoever including no dizziness, no chest pain, no shortness of breath, no palpitations.  Husband at bedside was in another room but reports that he heard her singing in the kitchen while she was cooking.  She started to sing louder, and then he heard a thud when she hit the floor.  He reports that she was fully out for possibly 2 minutes.  He reports no convulsion activity.  When patient came to she was disoriented and lethargic.  Husband reports that she had multiple episodes of urinary incontinence.  She remained in this state for 4 hours before she became alert and oriented again.  Patient reports that she has never had a seizure.  She does have family history with sister and daughter both having seizure disorders.  Patient does have atrial fibrillation for which she takes a beta-blocker.  She is not on anticoagulation she says this is because it interacts with her breast cancer medications.  Patient reports that up until this event she was in her normal state of health.  In the ED Temperature 98.2, heart rate 68, respiratory rate 16, blood pressure 97/63, satting at 97 percent on room air. Neuro consult reported no CT  head abnormalities.  Patient lethargic with no clear focal deficit.  Recommend CTA head and neck, MRI in the morning.  Recommend metabolic encephalopathy work-up. White blood cell count 3.9, hemoglobin 13.5 CHEM panel is unremarkable Negative Covid and flu Alcohol level less than 10 CTA head and neck and CT head without acute abnormalities EKG shows a heart rate of 85, sinus rhythm, QTC 449 Patient had headache and was given headache cocktail in the ER    Review of systems:    In addition to the HPI above,  No Fever-chills, Headache today no changes with Vision or hearing, positive for loss of consciousness No problems swallowing food or Liquids, No Chest pain, Cough or Shortness of Breath, No Abdominal pain, No Nausea or Vomiting, bowel movements are regular, No Blood in stool or Urine, No dysuria, No new skin rashes or bruises, No new joints pains-aches,  No new weakness, tingling, numbness in any extremity, No recent weight gain or loss, No polyuria, polydypsia or polyphagia, No significant Mental Stressors.  All other systems reviewed and are negative.  Past History of the following :    Past Medical History:  Diagnosis Date  . Arthritis   . Atrial fibrillation ([place])   . Cancer [facility])       Past Surgical History:  Procedure Laterality Date  . BREAST SURGERY        Social History:      Social History   Tobacco Use  . Smoking status: Never Smoker  . Smokeless tobacco: Never Used  Substance Use Topics  . Alcohol use: Yes       Family History :    History reviewed. No pertinent family history. Sister and daughter both have seizure disorders   Home Medications:   Prior to Admission medications   Medication Sig Start Date End Date Taking? Authorizing Provider  busPIRone (BUSPAR) 7.5 MG tablet Take 7.5 mg by mouth daily.   Yes [provider]  exemestane (AROMASIN) 25 MG tablet Take 25 mg by mouth daily after breakfast.   Yes [provider]  ibuprofen (ADVIL) 800 MG tablet Take 800 mg by mouth every 8 (eight) hours as needed.   Yes [provider]  leuprolide (LUPRON) 7.5 MG injection Inject 7.5 mg into the muscle every 28 (twenty-eight) days.    Yes [provider]  LORazepam (ATIVAN) 2 MG tablet Take 2 mg by mouth daily.   Yes [provider]  propranolol (INDERAL) 40 MG tablet Take 40 mg by mouth daily.    Yes [provider]  traZODone (DESYREL) 100 MG tablet Take 100 mg by mouth at bedtime.   Yes [provider]     Allergies:    No Known Allergies   Physical Exam:   Vitals  Blood pressure 97/63, pulse 68, temperature 98.2 F (36.8 C), temperature source Oral, resp. rate 16, height 5\' 5"  (1.651 m), weight 54.4 kg, SpO2 97 %.  1.  General: Lying supine in bed in no acute distress  2. Psychiatric: Mood and behavior normal for situation alert and oriented x3  3. Neurologic: Cranial nerves II through XII intact, finger-to-nose intact, moves all 4 extremities voluntarily, equal sensation in the upper and lower extremities bilaterally  4. HEENMT:  Head is atraumatic, normocephalic, pupils reactive to light, mucous membranes moist, trachea is midline, neck is supple  5. Respiratory : Lungs clear to auscultation bilaterally  6. Cardiovascular : Heart rate is normal, rhythm is regular, no murmurs rubs or gallops  7. Gastrointestinal:  Abdomen is soft, nondistended, nontender to palpation  8. Skin:  No acute lesions on limited skin exam  9.Musculoskeletal:  No acute deformities or peripheral edema    Data Review:    CBC Recent Labs  Lab 02/18/20 1910  WBC 3.9*  HGB 13.5  HCT 41.4  PLT 212  MCV 82.3  MCH 26.8  MCHC 32.6  RDW 13.1  LYMPHSABS 1.5  MONOABS 0.3  EOSABS 0.0  BASOSABS 0.0   ------------------------------------------------------------------------------------------------------------------  Results for orders placed or  performed during the hospital encounter of 02/18/20 (from the past 48 hour(s))  CBG monitoring, ED     Status: None   Collection Time: 02/18/20  6:44 PM  Result Value Ref Range   Glucose-Capillary 93 70 - 99 mg/dL    Comment: Glucose reference range applies only to samples taken after fasting for at least 8 hours.  Ethanol     Status: None   Collection Time: 02/18/20  7:10 PM  Result Value Ref Range   Alcohol, Ethyl (B) <10 <10 mg/dL  Comment: (NOTE) Lowest detectable limit for serum alcohol is 10 mg/dL.  For medical purposes only. Performed at [facility], [address], [place], [place] [address]   Protime-INR     Status: None   Collection Time: 02/18/20  7:10 PM  Result Value Ref Range   Prothrombin Time 13.0 11.4 - 15.2 seconds   INR 1.0 0.8 - 1.2    Comment: (NOTE) INR goal varies based on device and disease states. Performed at [facility], [address] [place], [place] [address]   APTT     Status: None   Collection Time: 02/18/20  7:10 PM  Result Value Ref Range   aPTT 26 24 - 36 seconds    Comment: Performed at [facility], [address], [place], [place] [address]  CBC     Status: Abnormal   Collection Time: 02/18/20  7:10 PM  Result Value Ref Range   WBC 3.9 (L) 4.0 - 10.5 K/uL   RBC 5.03 3.87 - 5.11 MIL/uL   Hemoglobin 13.5 12.0 - 15.0 g/dL   HCT 41.4 36 - 46 %   MCV 82.3 80.0 - 100.0 fL   MCH 26.8 26.0 - 34.0 pg   MCHC 32.6 30.0 - 36.0 g/dL   RDW 13.1 11.5 - 15.5 %   Platelets 212 150 - 400 K/uL   nRBC 0.0 0.0 - 0.2 %    Comment: Performed at [facility], [address], [place], [place] [address]  Differential     Status: None   Collection Time: 02/18/20  7:10 PM  Result Value Ref Range   Neutrophils Relative % 51 %   Neutro Abs 2.0 1.7 - 7.7 K/uL   Lymphocytes Relative 39 %   Lymphs Abs 1.5 0.7 - 4.0 K/uL   Monocytes Relative 8 %   Monocytes Absolute 0.3 0.1 - 1.0 K/uL   Eosinophils Relative 1 %   Eosinophils Absolute 0.0 0.0 - 0.5 K/uL    Basophils Relative 1 %   Basophils Absolute 0.0 0.0 - 0.1 K/uL   Immature Granulocytes 0 %   Abs Immature Granulocytes 0.01 0.00 - 0.07 K/uL    Comment: Performed at [facility], [address], [place], [place] [address]  Comprehensive metabolic panel     Status: Abnormal   Collection Time: 02/18/20  7:10 PM  Result Value Ref Range   Sodium 135 135 - 145 mmol/L   Potassium 3.5 3.5 - 5.1 mmol/L   Chloride 103 98 - 111 mmol/L   CO2 26 22 - 32 mmol/L   Glucose, Bld 67 (L) 70 - 99 mg/dL    Comment: Glucose reference range applies only to samples taken after fasting for at least 8 hours.   BUN 12 6 - 20 mg/dL   Creatinine, Ser 1.00 0.44 - 1.00 mg/dL   Calcium 9.1 8.9 - 10.3 mg/dL   Total Protein 9.2 (H) 6.5 - 8.1 g/dL   Albumin 4.2 3.5 - 5.0 g/dL   AST 26 15 - 41 U/L   ALT 17 0 - 44 U/L   Alkaline Phosphatase 54 38 - 126 U/L   Total Bilirubin 0.6 0.3 - 1.2 mg/dL   GFR, Estimated >60 >60 mL/min   Anion gap 6 5 - 15    Comment: Performed at [facility], [address], [place], [place] [address]  Respiratory Panel by RT PCR (Flu A&B, Covid) - Nasopharyngeal Swab     Status: None   Collection Time: 02/18/20  7:33 PM  Specimen: Nasopharyngeal Swab  Result Value Ref Range   SARS Coronavirus 2 by RT PCR NEGATIVE NEGATIVE    Comment: (NOTE) SARS-CoV-2 target nucleic acids are NOT DETECTED.  The SARS-CoV-2 RNA is generally detectable in upper respiratoy specimens during the acute phase of infection. The lowest concentration of SARS-CoV-2 viral copies this assay can detect is 131 copies/mL. A negative result does not preclude SARS-Cov-2 infection and should not be used as the sole basis for treatment or other patient management decisions. A negative result may occur with  improper specimen collection/handling, submission of specimen other than nasopharyngeal swab, presence of viral mutation(s) within the areas targeted by this assay, and inadequate number of viral copies (<131  copies/mL). A negative result must be combined with clinical observations, patient history, and epidemiological information. The expected result is Negative.  Fact Sheet for Patients:  PinkCheek.be  Fact Sheet for Healthcare Providers:  GravelBags.it  This test is no t yet approved or cleared by the [place] FDA and  has been authorized for detection and/or diagnosis of SARS-CoV-2 by FDA under an Emergency Use Authorization (EUA). This EUA will remain  in effect (meaning this test can be used) for the duration of the COVID-19 declaration under Section 564(b)(1) of the Act, 21 U.S.C. section 360bbb-3(b)(1), unless the authorization is terminated or revoked sooner.     Influenza A by PCR NEGATIVE NEGATIVE   Influenza B by PCR NEGATIVE NEGATIVE    Comment: (NOTE) The Xpert Xpress SARS-CoV-2/FLU/RSV assay is intended as an aid in  the diagnosis of influenza from Nasopharyngeal swab specimens and  should not be used as a sole basis for treatment. Nasal washings and  aspirates are unacceptable for Xpert Xpress SARS-CoV-2/FLU/RSV  testing.  Fact Sheet for Patients: PinkCheek.be  Fact Sheet for Healthcare Providers: GravelBags.it  This test is not yet approved or cleared by the [place] FDA and  has been authorized for detection and/or diagnosis of SARS-CoV-2 by  FDA under an Emergency Use Authorization (EUA). This EUA will remain  in effect (meaning this test can be used) for the duration of the  Covid-19 declaration under Section 564(b)(1) of the Act, 21  U.S.C. section 360bbb-3(b)(1), unless the authorization is  terminated or revoked. Performed at [facility], [address] [place], [place] [address]     Chemistries  Recent Labs  Lab 02/18/20 1910  NA 135  K 3.5  CL 103  CO2 26  GLUCOSE 67*  BUN 12  CREATININE 1.00  CALCIUM 9.1  AST 26    ALT 17  ALKPHOS 54  BILITOT 0.6   ------------------------------------------------------------------------------------------------------------------  ------------------------------------------------------------------------------------------------------------------ GFR: Estimated Creatinine Clearance: 61 mL/min (by C-G formula based on SCr of 1 mg/dL). Liver Function Tests: Recent Labs  Lab 02/18/20 1910  AST 26  ALT 17  ALKPHOS 54  BILITOT 0.6  PROT 9.2*  ALBUMIN 4.2   No results for input(s): LIPASE, AMYLASE in the last 168 hours. No results for input(s): AMMONIA in the last 168 hours. Coagulation Profile: Recent Labs  Lab 02/18/20 1910  INR 1.0   Cardiac Enzymes: No results for input(s): CKTOTAL, CKMB, CKMBINDEX, TROPONINI in the last 168 hours. BNP (last 3 results) No results for input(s): PROBNP in the last 8760 hours. HbA1C: No results for input(s): HGBA1C in the last 72 hours. CBG: Recent Labs  Lab 02/18/20 1844  GLUCAP 93   Lipid Profile: No results for input(s): CHOL, HDL, LDLCALC, TRIG, CHOLHDL, LDLDIRECT in the last 72 hours. Thyroid Function Tests: No  results for input(s): TSH, T4TOTAL, FREET4, T3FREE, THYROIDAB in the last 72 hours. Anemia Panel: No results for input(s): VITAMINB12, FOLATE, FERRITIN, TIBC, IRON, RETICCTPCT in the last 72 hours.  --------------------------------------------------------------------------------------------------------------- Urine analysis: No results found for: COLORURINE, APPEARANCEUR, LABSPEC, PHURINE, GLUCOSEU, HGBUR, BILIRUBINUR, KETONESUR, PROTEINUR, UROBILINOGEN, NITRITE, LEUKOCYTESUR    Imaging Results:    CT Angio Head W or Wo Contrast  Addendum Date: 02/18/2020   ADDENDUM REPORT: 02/18/2020 20:52 ADDENDUM: 3 mm left upper lobe lung nodule. No follow-up needed if patient is low-risk. Non-contrast chest CT can be considered in 12 months if patient is high-risk. This recommendation follows the consensus  statement: Guidelines for Management of Incidental Pulmonary Nodules Detected on CT Images: From the Fleischner Society 2017; Radiology 2017; 284:228-243. Electronically Signed   By: Logan Bores M.D.   On: 02/18/2020 20:52   Result Date: 02/18/2020 CLINICAL DATA:  Altered mental status. Nausea and vomiting. Acute onset headache and vertigo. Family history of subarachnoid hemorrhage. EXAM: CT ANGIOGRAPHY HEAD AND NECK TECHNIQUE: Multidetector CT imaging of the head and neck was performed using the standard protocol during bolus administration of intravenous contrast. Multiplanar CT image reconstructions and MIPs were obtained to evaluate the vascular anatomy. Carotid stenosis measurements (when applicable) are obtained utilizing NASCET criteria, using the distal internal carotid diameter as the denominator. CONTRAST:  72mL OMNIPAQUE IOHEXOL 350 MG/ML SOLN COMPARISON:  None. FINDINGS: CTA NECK FINDINGS Aortic arch: Standard 3 vessel aortic arch with widely patent arch vessel origins. Right carotid system: Patent and smooth without evidence of stenosis or dissection. Left carotid system: Patent and smooth without evidence of stenosis or dissection. Vertebral arteries: Patent and smooth without evidence of stenosis or dissection. Moderately dominant left vertebral artery. Skeleton: No acute osseous abnormality or suspicious osseous lesion. Other neck: Asymmetric heterogeneity and enlargement of the left thyroid lobe with a 2.7 cm exophytic nodule extending off the left lower pole. Heterogeneous appearance of the parotid and submandibular glands bilaterally with numerous punctate parotid calcifications bilaterally. Increased number of prominent but subcentimeter lymph nodes in levels I and II bilaterally measuring up to 8 mm in short axis. Upper chest: Mild biapical pleuroparenchymal lung scarring. 3 mm left upper lobe lung nodule. Bilateral breast implants. Review of the MIP images confirms the above findings CTA  HEAD FINDINGS Anterior circulation: The internal carotid arteries are widely patent from skull base to carotid termini. ACAs and MCAs are patent without evidence of a proximal branch occlusion or significant proximal stenosis. No aneurysm is identified. Posterior circulation: The intracranial vertebral arteries are widely patent to the basilar. Patent PICA, AICA, and SCA origins are seen bilaterally. There are small to moderate-sized right and diminutive or absent left posterior communicating arteries. PCAs are patent without evidence of a significant proximal stenosis. No aneurysm is identified. Venous sinuses: Patent. Anatomic variants: None. Review of the MIP images confirms the above findings IMPRESSION: 1. No large vessel occlusion, significant stenosis, or aneurysm in the head and neck. 2. 2.7 cm left thyroid nodule. Recommend thyroid US (ref: J Am Coll Radiol. 2015 Feb;12(2): 143-50). 3. Heterogeneous appearance of the salivary glands with numerous punctate parotid calcifications which may reflect an underlying inflammatory or autoimmune conditions such as Sjogren disease. 4. Borderline upper cervical lymphadenopathy, nonspecific though may be reactive or related to a systemic inflammatory condition. Electronically Signed: By: Logan Bores M.D. On: 02/18/2020 20:02   CT Angio Neck W and/or Wo Contrast  Addendum Date: 02/18/2020   ADDENDUM REPORT: 02/18/2020 20:52 ADDENDUM: 3 mm left upper  lobe lung nodule. No follow-up needed if patient is low-risk. Non-contrast chest CT can be considered in 12 months if patient is high-risk. This recommendation follows the consensus statement: Guidelines for Management of Incidental Pulmonary Nodules Detected on CT Images: From the Fleischner Society 2017; Radiology 2017; 284:228-243. Electronically Signed   By: Logan Bores M.D.   On: 02/18/2020 20:52   Result Date: 02/18/2020 CLINICAL DATA:  Altered mental status. Nausea and vomiting. Acute onset headache and  vertigo. Family history of subarachnoid hemorrhage. EXAM: CT ANGIOGRAPHY HEAD AND NECK TECHNIQUE: Multidetector CT imaging of the head and neck was performed using the standard protocol during bolus administration of intravenous contrast. Multiplanar CT image reconstructions and MIPs were obtained to evaluate the vascular anatomy. Carotid stenosis measurements (when applicable) are obtained utilizing NASCET criteria, using the distal internal carotid diameter as the denominator. CONTRAST:  1mL OMNIPAQUE IOHEXOL 350 MG/ML SOLN COMPARISON:  None. FINDINGS: CTA NECK FINDINGS Aortic arch: Standard 3 vessel aortic arch with widely patent arch vessel origins. Right carotid system: Patent and smooth without evidence of stenosis or dissection. Left carotid system: Patent and smooth without evidence of stenosis or dissection. Vertebral arteries: Patent and smooth without evidence of stenosis or dissection. Moderately dominant left vertebral artery. Skeleton: No acute osseous abnormality or suspicious osseous lesion. Other neck: Asymmetric heterogeneity and enlargement of the left thyroid lobe with a 2.7 cm exophytic nodule extending off the left lower pole. Heterogeneous appearance of the parotid and submandibular glands bilaterally with numerous punctate parotid calcifications bilaterally. Increased number of prominent but subcentimeter lymph nodes in levels I and II bilaterally measuring up to 8 mm in short axis. Upper chest: Mild biapical pleuroparenchymal lung scarring. 3 mm left upper lobe lung nodule. Bilateral breast implants. Review of the MIP images confirms the above findings CTA HEAD FINDINGS Anterior circulation: The internal carotid arteries are widely patent from skull base to carotid termini. ACAs and MCAs are patent without evidence of a proximal branch occlusion or significant proximal stenosis. No aneurysm is identified. Posterior circulation: The intracranial vertebral arteries are widely patent to the  basilar. Patent PICA, AICA, and SCA origins are seen bilaterally. There are small to moderate-sized right and diminutive or absent left posterior communicating arteries. PCAs are patent without evidence of a significant proximal stenosis. No aneurysm is identified. Venous sinuses: Patent. Anatomic variants: None. Review of the MIP images confirms the above findings IMPRESSION: 1. No large vessel occlusion, significant stenosis, or aneurysm in the head and neck. 2. 2.7 cm left thyroid nodule. Recommend thyroid US (ref: J Am Coll Radiol. 2015 Feb;12(2): 143-50). 3. Heterogeneous appearance of the salivary glands with numerous punctate parotid calcifications which may reflect an underlying inflammatory or autoimmune conditions such as Sjogren disease. 4. Borderline upper cervical lymphadenopathy, nonspecific though may be reactive or related to a systemic inflammatory condition. Electronically Signed: By: Logan Bores M.D. On: 02/18/2020 20:02   CT HEAD CODE STROKE WO CONTRAST  Result Date: 02/18/2020 CLINICAL DATA:  Code stroke.  Passed out.  Vomiting today. EXAM: CT HEAD WITHOUT CONTRAST TECHNIQUE: Contiguous axial images were obtained from the base of the skull through the vertex without intravenous contrast. COMPARISON:  None. FINDINGS: Brain: No evidence of acute infarction, hemorrhage, hydrocephalus, extra-axial collection or mass lesion/mass effect. Mild and incidental mineralization at the globus pallidus. Vascular: No hyperdense vessel or unexpected calcification. Skull: Normal. Negative for fracture or focal lesion. Sinuses/Orbits: Unspecific gaze to the right. Other: Attempted call report but physician is currently on the other  line. Bilateral parotid calcifications. ASPECTS [facility] Stroke Program Early CT Score) Not scored with this history IMPRESSION: Normal appearance of the brain. Electronically Signed   By: Monte Fantasia M.D.   On: 02/18/2020 19:49    My personal review of EKG: Rhythm NSR,  Rate 85 /min, QTc 449 ,no Acute ST changes   Assessment & Plan:    Active Problems:   Syncope   1. Syncope and collapse 1. No presyncopal symptoms 2. Patient was lethargic and with vertigo upon presentation so code stroke was called 3. CT head and CTA head and neck showed no acute abnormalities 4. Tele neuro advised MRI, and work-up for metabolic encephalopathy 5. Echo in the a.m. 6. Patient does have A. fib and is not on anticoagulation secondary to medication interaction with breast cancer treatment 7. Monitor on telemetry 2. Metabolic encephalopathy 1. Patient was lethargic at the time of tele neuro exam 2. Patient remained that way for 4 hours, but is now alert and oriented 3. Sister and daughter both have seizure disorders 4. Post ictal state is high on the differential 5. We will transfer to [facility] for EEG 6. Holding off loading with Keppra as there has not been witnessed seizure activity 3. Atrial fibrillation 1. Continue home medication 4. Headache 1. Headache resolved with headache cocktail    DVT Prophylaxis-   heparin - SCDs   AM Labs Ordered, also please review Full Orders  Family Communication: Discussed plan with husband and patient.  Code Status:  FULL  Admission status: Inpatient :The appropriate admission status for this patient is INPATIENT. Inpatient status is judged to be reasonable and necessary in order to provide the required intensity of service to ensure the patient's safety. The patient's presenting symptoms, physical exam findings, and initial radiographic and laboratory data in the context of their chronic comorbidities is felt to place them at high risk for further clinical deterioration. Furthermore, it is not anticipated that the patient will be medically stable for discharge from the hospital within 2 midnights of admission. The following factors support the admission status of inpatient.     The patient's presenting symptoms include  syncope and collapse The worrisome physical exam findings include lethargy at presentation. The chronic co-morbidities include Afib, hx of breast cancer       * I certify that at the point of admission it is my clinical judgment that the patient will require inpatient hospital care spanning beyond 2 midnights from the point of admission due to high intensity of service, high risk for further deterioration and high frequency of surveillance required.*  Time spent in minutes : [place]

## 2020-02-19 NOTE — Progress Notes (Signed)
1917 call time 1930 exam started 1933 exam finished 1933 images sent to soc 1934 exam completed in epic 1934 [facility] radiology called

## 2020-02-19 NOTE — Progress Notes (Signed)
*  PRELIMINARY RESULTS* Echocardiogram 2D Echocardiogram has been performed.  Leavy Cella 02/19/2020, 8:59 AM

## 2020-02-20 ENCOUNTER — Encounter (HOSPITAL_COMMUNITY): Payer: Self-pay | Admitting: Family Medicine

## 2020-02-20 ENCOUNTER — Inpatient Hospital Stay (HOSPITAL_COMMUNITY): Payer: Medicare (Managed Care)

## 2020-02-20 DIAGNOSIS — E041 Nontoxic single thyroid nodule: Secondary | ICD-10-CM

## 2020-02-20 DIAGNOSIS — R55 Syncope and collapse: Secondary | ICD-10-CM | POA: Diagnosis not present

## 2020-02-20 DIAGNOSIS — G9340 Encephalopathy, unspecified: Secondary | ICD-10-CM

## 2020-02-20 DIAGNOSIS — K5900 Constipation, unspecified: Secondary | ICD-10-CM

## 2020-02-20 DIAGNOSIS — R42 Dizziness and giddiness: Secondary | ICD-10-CM

## 2020-02-20 DIAGNOSIS — R519 Headache, unspecified: Secondary | ICD-10-CM

## 2020-02-20 DIAGNOSIS — I4891 Unspecified atrial fibrillation: Secondary | ICD-10-CM

## 2020-02-20 HISTORY — DX: Nontoxic single thyroid nodule: E04.1

## 2020-02-20 LAB — TSH: TSH: 1.062 u[IU]/mL (ref 0.350–4.500)

## 2020-02-20 LAB — HEMOGLOBIN A1C
Hgb A1c MFr Bld: 5.5 % (ref 4.8–5.6)
Mean Plasma Glucose: 111 mg/dL

## 2020-02-20 LAB — CORTISOL: Cortisol, Plasma: 9.7 ug/dL

## 2020-02-20 MED ORDER — HYDROCODONE-ACETAMINOPHEN 5-325 MG PO TABS
1.0000 | ORAL_TABLET | Freq: Four times a day (QID) | ORAL | Status: DC | PRN
  Administered 2020-02-20 (×2): 2 via ORAL
  Administered 2020-02-21: 1 via ORAL
  Filled 2020-02-20: qty 1
  Filled 2020-02-20 (×2): qty 2

## 2020-02-20 MED ORDER — POLYETHYLENE GLYCOL 3350 17 G PO PACK
17.0000 g | PACK | Freq: Two times a day (BID) | ORAL | Status: DC
  Administered 2020-02-20 – 2020-02-28 (×10): 17 g via ORAL
  Filled 2020-02-20 (×15): qty 1

## 2020-02-20 MED ORDER — KETOROLAC TROMETHAMINE 15 MG/ML IJ SOLN
15.0000 mg | Freq: Once | INTRAMUSCULAR | Status: AC
  Administered 2020-02-20: 15 mg via INTRAVENOUS
  Filled 2020-02-20: qty 1

## 2020-02-20 MED ORDER — SENNA 8.6 MG PO TABS
1.0000 | ORAL_TABLET | Freq: Every day | ORAL | Status: DC
  Administered 2020-02-20 – 2020-02-28 (×9): 8.6 mg via ORAL
  Filled 2020-02-20 (×9): qty 1

## 2020-02-20 NOTE — Consult Note (Signed)
NEUROLOGY CONSULTATION NOTE   Date of service: February 20, 2020 Patient Name: April Acevedo MRN:  [id number] DOB:  Jun 08, 1975 Reason for consult: "rule out seizures"  History of Present Illness  April Acevedo is a 44 y.o. female with PMH significant for afibb not on anticoagulation 2/2 interaction with her breast cancer meds who presents with  An episode of Syncope.   History provided by the patient and her husband who was at the bedside.  Patient reports that she had not eaten a lot and was really hungry so she went to cook.  She reports a sense of nervousness which is not unusual for her when she is hungry.  She endorses that this sense of nervousness kind of grew more and the next thing she knows is waking up in the hospital.  Husband reports that he heard her singing and it is not unusual for her to sing.  However, her voice was really loud and it kept on getting louder and louder almost to the point of yelling.  Next he heard a loud thud and both he and the son found her on the ground passed out.  She was unconscious for about 2 minutes they did not see any shaking or jerking concerning for seizures.  She gradually started coming back but she was very disoriented and lethargic afterwards.  He offered her some food to eat and when she continued to be lethargic, he called EMS.  She did not have any bowel or bladder incontinence during this event however, she did lose control of her bladder a couple times in the hospital when she was lethargic and tired.  Patient reports she had events of staring off into space when she was a kid but she kind of outgrew them. She was seen at [facility] by teleneurology and work-up with CT head, CTA head and neck and MRI brain was negative.  She does have family history of seizures in her sister and her daughter.  She had a routine EEG here with no seizures or epileptiform discharges.   ROS   Constitutional Denies weight loss, fever and chills.    HEENT Denies changes in vision and hearing.   Respiratory Denies SOB and cough.   CV Denies palpitations and CP   GI Denies abdominal pain, nausea, vomiting and diarrhea.   GU Denies dysuria and urinary frequency.   MSK Denies myalgia and joint pain.   Skin Denies rash and pruritus.   Neurological Denies headache and syncope.   Psychiatric Denies recent changes in mood. Denies anxiety and depression.    Past History   Past Medical History:  Diagnosis Date  . Arthritis   . Atrial fibrillation ([place])   . Cancer [facility])    Past Surgical History:  Procedure Laterality Date  . BREAST SURGERY     History reviewed. No pertinent family history. Social History   Socioeconomic History  . Marital status: Married    Spouse name: Not on file  . Number of children: Not on file  . Years of education: Not on file  . Highest education level: Not on file  Occupational History  . Not on file  Tobacco Use  . Smoking status: Never Smoker  . Smokeless tobacco: Never Used  Vaping Use  . Vaping Use: Never used  Substance and Sexual Activity  . Alcohol use: Yes  . Drug use: Never  . Sexual activity: Not on file  Other Topics Concern  . Not on file  Social History Narrative  .  Not on file   Social Determinants of Health   Financial Resource Strain:   . Difficulty of Paying Living Expenses: Not on file  Food Insecurity:   . Worried About Charity fundraiser in the Last Year: Not on file  . Ran Out of Food in the Last Year: Not on file  Transportation Needs:   . Lack of Transportation (Medical): Not on file  . Lack of Transportation (Non-Medical): Not on file  Physical Activity:   . Days of Exercise per Week: Not on file  . Minutes of Exercise per Session: Not on file  Stress:   . Feeling of Stress : Not on file  Social Connections:   . Frequency of Communication with Friends and Family: Not on file  . Frequency of Social Gatherings with Friends and Family: Not on file  . Attends  Religious Services: Not on file  . Active Member of Clubs or Organizations: Not on file  . Attends Archivist Meetings: Not on file  . Marital Status: Not on file   No Known Allergies  Medications   Medications Prior to Admission  Medication Sig Dispense Refill Last Dose  . busPIRone (BUSPAR) 7.5 MG tablet Take 7.5 mg by mouth daily.   02/18/2020 at Unknown time  . exemestane (AROMASIN) 25 MG tablet Take 25 mg by mouth daily after breakfast.   02/18/2020 at Unknown time  . ibuprofen (ADVIL) 800 MG tablet Take 800 mg by mouth every 8 (eight) hours as needed.   02/18/2020 at Unknown time  . leuprolide (LUPRON) 7.5 MG injection Inject 7.5 mg into the muscle every 28 (twenty-eight) days.    12/06/2019  . LORazepam (ATIVAN) 2 MG tablet Take 2 mg by mouth daily.   02/17/2020 at Unknown time  . propranolol (INDERAL) 40 MG tablet Take 40 mg by mouth daily.    02/18/2020 at 0900  . traZODone (DESYREL) 100 MG tablet Take 100 mg by mouth at bedtime.   02/17/2020 at Unknown time     Vitals   Vitals:   02/19/20 1948 02/20/20 0015 02/20/20 0409 02/20/20 0757  BP: 106/68 96/64 (!) 88/58 104/65  Pulse: 62 (!) 58 (!) 58 (!) 58  Resp: 16 16 18 16   Temp: 98.4 F (36.9 C) 97.7 F (36.5 C) (!) 97.4 F (36.3 C) 98.4 F (36.9 C)  TempSrc: Oral Oral Oral Oral  SpO2: 100% 98% 99% 94%  Weight:      Height:         Body mass index is 19.97 kg/m.  Physical Exam   General: Laying comfortably in bed; in no acute distress.  HENT: Normal oropharynx and mucosa. Normal external appearance of ears and nose.  Neck: Supple, no pain or tenderness  CV: No JVD. No peripheral edema.  Pulmonary: Symmetric Chest rise. Normal respiratory effort.  Abdomen: Soft to touch, non-tender.  Ext: No cyanosis, edema, or deformity  Skin: No rash. Normal palpation of skin.   Musculoskeletal: Normal digits and nails by inspection. No clubbing.   Neurologic Examination  Mental status/Cognition: Alert, oriented  to self, place, month and year, flat affect, depressed mood. Speech/language: Fluent, comprehension intact, object naming intact, repetition intact. Cranial nerves:   CN II Pupils equal and reactive to light, no VF deficits   CN III,IV,VI EOM intact, no gaze preference or deviation, no nystagmus   CN V normal sensation in V1, V2, and V3 segments bilaterally    CN VII no asymmetry, no nasolabial fold flattening  CN VIII normal hearing to speech    CN IX & X normal palatal elevation, no uvular deviation    CN XI 5/5 head turn and 5/5 shoulder shrug bilaterally    CN XII midline tongue protrusion    Motor:  Muscle bulk: normal, tone normal, pronator drift none tremor none Mvmt Root Nerve  Muscle Right Left Comments  SA C5/6 Ax Deltoid 4+ 4+   EF C5/6 Mc Biceps 4+ 4+   EE C6/7/8 Rad Triceps 4+ 4+   WF C6/7 Med FCR 4+ 4+   WE C7/8 PIN ECU 4+ 4+   F Ab C8/T1 U ADM/FDI 4+ 4+   HF L1/2/3 Fem Illopsoas 4+ 4+   KE L2/3/4 Fem Quad 4+ 4+   DF L4/5 D Peron Tib Ant 4+ 4+   PF S1/2 Tibial Grc/Sol 4+ 4+    Reflexes:  Right Left Comments  Pectoralis      Biceps (C5/6) 1 1   Brachioradialis (C5/6) 1 1    Triceps (C6/7) 1 1    Patellar (L3/4) 1 1    Achilles (S1) 1 1    Hoffman      Plantar     Jaw jerk    Sensation:  Light touch Intact throughout   Pin prick    Temperature    Vibration   Proprioception    Coordination/Complex Motor:  - Finger to Nose intact BL - Rapid alternating movement are normal  Labs   CBC:  Recent Labs  Lab 02/18/20 1910 02/19/20 0446  WBC 3.9* 3.1*  NEUTROABS 2.0  --   HGB 13.5 11.5*  HCT 41.4 34.7*  MCV 82.3 82.4  PLT 212 497    Basic Metabolic Panel:  Lab Results  Component Value Date   NA 138 02/19/2020   K 3.7 02/19/2020   CO2 24 02/19/2020   GLUCOSE 121 (H) 02/19/2020   BUN 12 02/19/2020   CREATININE 0.94 02/19/2020   CALCIUM 8.1 (L) 02/19/2020   GFRNONAA >60 02/19/2020   Lipid Panel:  Lab Results  Component Value Date    LDLCALC 71 02/19/2020   HgbA1c: No results found for: HGBA1C Urine Drug Screen: No results found for: LABOPIA, COCAINSCRNUR, LABBENZ, AMPHETMU, THCU, LABBARB  Alcohol Level     Component Value Date/Time   ETH <10 02/18/2020 1910    MRI Brain with and without contrast: IMPRESSION: 1. No metastatic disease or acute intracranial abnormality. Minimal to mild for age nonspecific cerebral white matter signal changes. 2. Mild left mastoid effusion, likely postinflammatory and significance doubtful. 3. Granular appearance of the bilateral salivary glands suggesting Sjogren's syndrome or similar chronic/recurrent inflammatory Process.  REEG: This study is within normal limits. No seizures or epileptiform discharges were seen throughout the recording.  Impression   April Acevedo is a 44 y.o. female with PMH significant for afibb not on anticoagulation 2/2 interaction with her breast cancer meds who presents with an episode of passing out for 2 mins with prolonged somnolence and confusion afterwards. I do feel that there are certain aspects of the semiology of this event that are more concerning for a seizure including ?prodrome of overwhelming nervousness, prolonged post ictal confusion. But she also had not eaten much that day and husband reports some improvement in cognition after he gave her some food after the episode and that she was very hungry. Her workup with MRI Brain and a routine EEG are negative for any structural abnormality or for any epileptogenic discharges. My suspicion for her having  a seizure in the future is low and I would hold off on starting her on an AED at this time. Given her risk factors thou including significant family hx of epilepsy, episodes of starring off as a kid that have never been worked up per patient, I do think that she will benefit from seeing a neurologist outpatient. Would have low threshold to start her on Keppra 500mg  BID if she has another identical  or very similar episode.  Recommendations  - Hold off on AEDs at this time. - Follow up with neurology outpatient - I discussed with her that she should talk to her cardiologist and her oncologist about taking an anticoagulation for stroke prevention for her Afibb. She and husband will discuss this with them. - No driving for 6 months. She should be free of any seizure like episodes before she can resume driving. - Full seizure precautions listed below. - Neurology inpatient team will signoff. Please feel free to contact us with any questions or concerns. ______________________________________________________________________   Thank you for the opportunity to take part in the care of this patient. If you have any further questions, please contact the neurology consultation attending.  Signed,  Donnetta Simpers [facility] Pager Number [phone]  Seizure precautions: Per [facility] statutes, patients with seizures are not allowed to drive until they have been seizure-free for six months and cleared by a physician    Use caution when using heavy equipment or power tools. Avoid working on ladders or at heights. Take showers instead of baths. Ensure the water temperature is not too high on the home water heater. Do not go swimming alone. Do not lock yourself in a room alone (i.e. bathroom). When caring for infants or small children, sit down when holding, feeding, or changing them to minimize risk of injury to the child in the event you have a seizure. Maintain good sleep hygiene. Avoid alcohol.    If patient has another seizure, call 911 and bring them back to the ED if: A.  The seizure lasts longer than 5 minutes.      B.  The patient doesn't wake shortly after the seizure or has new problems such as difficulty seeing, speaking or moving following the seizure C.  The patient was injured during the seizure D.  The patient has a temperature over 102 F (39C) E.  The  patient vomited during the seizure and now is having trouble breathing    During the Seizure   - First, ensure adequate ventilation and place patients on the floor on their left side  Loosen clothing around the neck and ensure the airway is patent. If the patient is clenching the teeth, do not force the mouth open with any object as this can cause severe damage - Remove all items from the surrounding that can be hazardous. The patient may be oblivious to what's happening and may not even know what he or she is doing. If the patient is confused and wandering, either gently guide him/her away and block access to outside areas - Reassure the individual and be comforting - Call 911. In most cases, the seizure ends before EMS arrives. However, there are cases when seizures may last over 3 to 5 minutes. Or the individual may have developed breathing difficulties or severe injuries. If a pregnant patient or a person with diabetes develops a seizure, it is prudent to call an ambulance. - Finally, if the patient does not regain full consciousness, then call EMS.  Most patients will remain confused for about 45 to 90 minutes after a seizure, so you must use judgment in calling for help. - Avoid restraints but make sure the patient is in a bed with padded side rails - Place the individual in a lateral position with the neck slightly flexed; this will help the saliva drain from the mouth and prevent the tongue from falling backward - Remove all nearby furniture and other hazards from the area - Provide verbal assurance as the individual is regaining consciousness - Provide the patient with privacy if possible - Call for help and start treatment as ordered by the caregiver    After the Seizure (Postictal Stage)   After a seizure, most patients experience confusion, fatigue, muscle pain and/or a headache. Thus, one should permit the individual to sleep. For the next few days, reassurance is essential. Being  calm and helping reorient the person is also of importance.   Most seizures are painless and end spontaneously. Seizures are not harmful to others but can lead to complications such as stress on the lungs, brain and the heart. Individuals with prior lung problems may develop labored breathing and respiratory distress.

## 2020-02-20 NOTE — Evaluation (Signed)
Occupational Therapy Evaluation Patient Details Name: April Acevedo MRN: [id number] DOB: 01-06-1976 Today's Date: 02/20/2020    History of Present Illness  April Acevedo  is a 44 y.o. female, with history of atrial fibrillation and breast cancer -currently still taking Aromasin and Lupron, presents to the ED with a chief complaint of syncope.   Clinical Impression   PT admitted with pending workup. Pt currently with functional limitiations due to the deficits listed below (see OT problem list). Pt currently with dizziness described as "I am spinning". Pt reports ear infection without medical care started 1/44 and certain of this date due to it started the day before her son's wedding. Reports drainage that was clear for 2 days and then week of blood green/ yellow pus drainage after from L ear. Pt with pain across forehead and unable to tolerate any pressure on forehead. Reports not pain with tabbing under eyes. Pt reports symmetrical hearing with bed level testing. Pt will benefit from skilled OT to increase their independence and safety with adls and balance to allow discharge outpatient pending progress.     Follow Up Recommendations  Outpatient OT (pending progress)    Equipment Recommendations  None recommended by OT    Recommendations for Other Services PT consult (vestibular)     Precautions / Restrictions Precautions Precautions: Fall      Mobility Bed Mobility Overal bed mobility: Needs Assistance Bed Mobility: Supine to Sit;Sit to Supine     Supine to sit: Mod assist Sit to supine: Mod assist   General bed mobility comments: requires (A) to bring bil LE off EOB and to elevate trunk from surface. pt pulling up with R UE. Pt requires (A) to sustain for extended period of time. When therapist reduced (A) pt fatigued and request returning to supine  Transfers                 General transfer comment: not attempted this session due to patient unable to  sustain static sitting    Balance Overall balance assessment: Needs assistance Sitting-balance support: Bilateral upper extremity supported;Feet supported Sitting balance-Leahy Scale: Poor                                     ADL either performed or assessed with clinical judgement   ADL Overall ADL's : Needs assistance/impaired Eating/Feeding: Moderate assistance Eating/Feeding Details (indicate cue type and reason): spouse taking cracker and breaking into small pieces and helping patient place in mouth this session at 60 degrees sitting                 Lower Body Dressing: Maximal assistance Lower Body Dressing Details (indicate cue type and reason): simulated supine    Toilet Transfer Details (indicate cue type and reason): unabel to tolerate at this time           General ADL Comments: educated on gaze stabilization to help increase HOB     Vision Baseline Vision/History: No visual deficits       Perception     Praxis      Pertinent Vitals/Pain Pain Assessment: 0-10 Pain Score: 10-Worst pain ever Pain Location: neck Pain Descriptors / Indicators: Headache (neck is worst pain) Pain Intervention(s): Premedicated before session;Repositioned     Hand Dominance Right   Extremity/Trunk Assessment Upper Extremity Assessment Upper Extremity Assessment: Generalized weakness   Lower Extremity Assessment Lower Extremity Assessment: Generalized weakness   Cervical /  Trunk Assessment Cervical / Trunk Assessment: Other exceptions (reports severe neck pain)   Communication Communication Communication: No difficulties   Cognition Arousal/Alertness: Awake/alert Behavior During Therapy: WFL for tasks assessed/performed                                       General Comments       Exercises     Shoulder Instructions      Home Living Family/patient expects to be discharged to:: Private residence Living Arrangements:  Spouse/significant other;Children Available Help at Discharge: Family Type of Home: House Home Access: Stairs to enter Technical brewer of Steps: 2 Entrance Stairs-Rails: None Home Layout: Two level;Able to live on main level with bedroom/bathroom     Bathroom Shower/Tub: Occupational psychologist: Standard     Home Equipment: None   Additional Comments: son daugther and grandson ( 22 months grandson)  Lives With: Spouse;Son    Prior Functioning/Environment Level of Independence: Independent        Comments: disabled does not work        OT Problem List: Decreased strength;Decreased activity tolerance;Impaired balance (sitting and/or standing);Decreased cognition;Decreased knowledge of precautions;Pain      OT Treatment/Interventions: Self-care/ADL training;Therapeutic exercise;Energy conservation;DME and/or AE instruction;Manual therapy;Therapeutic activities;Cognitive remediation/compensation;Patient/family education;Balance training    OT Goals(Current goals can be found in the care plan section) Acute Rehab OT Goals Patient Stated Goal: to stop feeling dizzy OT Goal Formulation: With patient/family Time For Goal Achievement: 03/05/20 Potential to Achieve Goals: Good  OT Frequency: Min 2X/week   Barriers to D/C:            Co-evaluation              AM-PAC OT "6 Clicks" Daily Activity     Outcome Measure Help from another person eating meals?: A Lot Help from another person taking care of personal grooming?: A Lot Help from another person toileting, which includes using toliet, bedpan, or urinal?: A Lot Help from another person bathing (including washing, rinsing, drying)?: A Lot Help from another person to put on and taking off regular upper body clothing?: A Lot Help from another person to put on and taking off regular lower body clothing?: A Lot 6 Click Score: 12   End of Session Nurse Communication: Mobility  status;Precautions  Activity Tolerance: Patient limited by pain Patient left: in bed;with call bell/phone within reach;with bed alarm set;with family/visitor present  OT Visit Diagnosis: Unsteadiness on feet (R26.81);Pain Pain - part of body:  (forehead area above eyes)                Time: 1694-5038 OT Time Calculation (min): 28 min Charges:  OT General Charges $OT Visit: 1 Visit OT Evaluation $OT Eval Moderate Complexity: 1 Mod OT Treatments $Self Care/Home Management : 8-22 mins   Brynn, OTR/L  Acute Rehabilitation Services Pager: [phone] Office: [phone] .   Jeri Modena 02/20/2020, 4:28 PM

## 2020-02-20 NOTE — Progress Notes (Signed)
PT Cancellation Note  Patient Details Name: April Acevedo MRN: [id number] DOB: April 16, 1976   Cancelled Treatment:    Reason Eval/Treat Not Completed: Medical issues which prohibited therapy. Pt just sat up with RN and became dizzy and began vomiting.Resting now. Will hold PT for now and check back as time allows.   Leighton Roach, PT  Acute Rehab Services  Pager [phone] Office [place] 02/20/2020, 12:50 PM

## 2020-02-20 NOTE — Progress Notes (Signed)
EEG complete - results pending 

## 2020-02-20 NOTE — Evaluation (Signed)
Speech Language Pathology Evaluation Patient Details Name: April Acevedo MRN: [id number] DOB: [birth year] Today's Date: 02/20/2020 Time: 5465-0354 SLP Time Calculation (min) (ACUTE ONLY): 16 min  Problem List:  Patient Active Problem List   Diagnosis Date Noted  . Altered mental status   . Syncope 02/18/2020   Past Medical History:  Past Medical History:  Diagnosis Date  . Arthritis   . Atrial fibrillation ([place])   . Cancer [facility])    Past Surgical History:  Past Surgical History:  Procedure Laterality Date  . BREAST SURGERY     HPI:  44 y.o. female with hx of afib and breast cancer (currently still taking Aromasin and Lupron), presented to ED after syncopal episode and collapse at home. CT head and MRI negative for acute infarct.  W/u underway, EEG pending; continues with metabolic encephalopathy.Pt and family moved here recently from [place], have not established PCP yet.   Assessment / Plan / Recommendation Clinical Impression  Pt participated in cognitive/communication assessment as ordered.  She is alert, oriented to place/person, inconsistently to elements of time and situation.  Her husband is at the bedside.  Pt presents with limited spontaneous verbal output, answering questions in single words or short phrases.  Auditory comprehension and expressive language are intact.  Speech is clear despite paucity of output.  Immediate recall WFL, short-term recall impaired,  poor performance on category generation tasks.  Pt acknowledges feeling confused, stating that she feels like she was in a car accident.  She understands that w/u, including EEG, is underway.  SLP will follow briefly given cognitive changes and to determine any other needs.     SLP Assessment  SLP Recommendation/Assessment: Patient needs continued Speech Lanaguage Pathology Services    Follow Up Recommendations  Other (comment) (tba)    Frequency and Duration min 1 x/week  1 week      SLP  Evaluation Cognition  Overall Cognitive Status: Impaired/Different from baseline Arousal/Alertness: Awake/alert Orientation Level: Oriented to person;Oriented to place;Disoriented to time Attention: Sustained Sustained Attention: Appears intact Memory: Impaired Memory Impairment: Storage deficit;Retrieval deficit Awareness: Impaired Awareness Impairment: Emergent impairment Behaviors:  (flat affect, limited spontaneity)       Comprehension  Auditory Comprehension Overall Auditory Comprehension: Appears within functional limits for tasks assessed Yes/No Questions: Within Functional Limits Commands: Within Functional Limits Reading Comprehension Reading Status: Not tested    Expression Expression Primary Mode of Expression: Verbal Verbal Expression Overall Verbal Expression: Appears within functional limits for tasks assessed Written Expression Written Expression: Not tested   Oral / Motor  Oral Motor/Sensory Function Overall Oral Motor/Sensory Function: Within functional limits Motor Speech Overall Motor Speech: Appears within functional limits for tasks assessed                       Juan Quam Laurice 02/20/2020, 10:29 AM  Estill Bamberg L. Tivis Ringer, [place] Office number [phone] Pager [phone]

## 2020-02-20 NOTE — Procedures (Signed)
Patient Name: Twana Wileman  MRN: [id number]  Epilepsy Attending: Lora Havens  Referring Physician/Provider: Dr Dannielle Huh Date: 02/20/2020 Duration: 24.42 minutes  Patient history: 76 old female who presented with an episode of syncope.  EEG to evaluate for seizures.  Level of alertness: Awake, asleep  AEDs during EEG study: None  Technical aspects: This EEG study was done with scalp electrodes positioned according to the 10-20 International system of electrode placement. Electrical activity was acquired at a sampling rate of 500Hz  and reviewed with a high frequency filter of 70Hz  and a low frequency filter of 1Hz . EEG data were recorded continuously and digitally stored.   Description: The posterior dominant rhythm consists of 9 Hz activity of moderate voltage (25-35 uV) seen predominantly in posterior head regions, symmetric and reactive to eye opening and eye closing. Sleep was characterized by vertex waves, sleep spindles (12 to 14 Hz), maximal frontocentral region.  Physiologic photic driving was seen during photic stimulation.  Hyperventilation was not performed.     IMPRESSION: This study is within normal limits. No seizures or epileptiform discharges were seen throughout the recording.   Danzell Birky Barbra Sarks

## 2020-02-20 NOTE — Hospital Course (Signed)
44 year old woman PMH atrial fibrillation not on anticoagulation, breast cancer taking Aromasin and Lupron presented after loss of consciousness at home.  Was standing cooking, singing and lost consciousness.  No observed seizure activity.  Multiple episodes of urinary incontinence afterwards. Admitted for further evaluation of syncope, loss of consciousness, and neurologic evaluation

## 2020-02-20 NOTE — Progress Notes (Signed)
PROGRESS NOTE  April Acevedo HER:[id number] DOB: [birth year] DOA: 02/18/2020 PCP: Patient, No Pcp Per  Brief History   44 year old woman PMH atrial fibrillation not on anticoagulation, breast cancer taking Aromasin and Lupron presented after loss of consciousness at home.  Was standing cooking, singing and lost consciousness.  No observed seizure activity.  Multiple episodes of urinary incontinence afterwards. Admitted for further evaluation of syncope, loss of consciousness, and neurologic evaluation   A & P  Syncope --Etiology unclear.  Family history of seizure disorder and daughter and sister.  No personal history of head injury or previous seizures.  MRI brain and CT head and neck unremarkable.  No evidence to suggest cardiogenic.  Echocardiogram, EKG unremarkable.  Telemetry sinus rhythm. --Was confused afterwards.  Husband described some seizure-like activity afterwards with some gentle shaking of the arms which is not convincing for seizure. --EEG normal --Continue telemetry, follow-up neurology evaluation.  TSH and cortisol unremarkable.  Vertigo --Etiology unclear.  Imaging unremarkable. --Supportive care.  Neurology evaluation.  Acute encephalopathy --Alert and oriented but mentation appears to be mildly slowed.  Chemistry panel and CBC unremarkable.  TSH within normal limits.  Cannot rule out post ictal phenomenon although no evidence of seizure at this time.  Atrial fibrillation --Telemetry shows sinus rhythm  Headache, frontal --Imaging negative.  No concerning features.  Treat with analgesia.  2.7 cm left thyroid nodule. Recommend thyroid US as outpatient  Heterogeneous appearance of the salivary glands with numerous punctate parotid calcifications which may reflect an underlying inflammatory or autoimmune conditions such as Sjogren disease. --Follow-up as an outpatient  Constipation x1 week --no concerning features --bowel regimen  Disposition Plan:    Discussion: Remains with headache and vertigo.  Unable to set up.  Evaluation thus far unremarkable.  Further recommendations per neurology.  Status is: Inpatient  Remains inpatient appropriate because:Inpatient level of care appropriate due to severity of illness   Dispo: The patient is from: Home              Anticipated d/c is to: Home              Anticipated d/c date is: 2 days              Patient currently is not medically stable to d/c.  DVT prophylaxis: heparin injection 5,000 Units Start: 02/18/20 2345 SCD's Start: 02/18/20 2344   Code Status: Full Code Family Communication: husband at bedside  Murray Hodgkins, MD  [facility] Direct contact: see [email or web address] (further directions at bottom of note if needed) 7PM-7AM contact night coverage as at bottom of note 02/20/2020, 2:47 PM  LOS: 2 days   Significant Hospital Events   .    Consults:  . Neurology    Procedures:  .   Significant Diagnostic Tests:  Marland Kitchen    Micro Data:  .    Antimicrobials:  .   Interval History/Subjective  CC: f/u syncope  Reports frontal headache.  Vertigo with attempts at movement.  Breathing okay.  No focal motor deficits.  Husband arrives and confirms history and chart.  Objective   Vitals:  Vitals:   02/20/20 1154 02/20/20 1200  BP: (!) 129/93 100/69  Pulse:    Resp:    Temp:    SpO2:      Exam:  Physical Exam Constitutional:      General: She is not in acute distress.    Appearance: She is ill-appearing. She is not toxic-appearing.  Cardiovascular:     Rate and Rhythm:  Normal rate and regular rhythm.     Heart sounds: Normal heart sounds. No murmur heard.  No friction rub. No gallop.      Comments: Telemetry SR Pulmonary:     Effort: Pulmonary effort is normal. No respiratory distress.     Breath sounds: Normal breath sounds. No wheezing, rhonchi or rales.  Abdominal:     General: There is no distension.     Palpations: Abdomen is soft.  Neurological:      General: No focal deficit present.     Mental Status: She is alert and oriented to person, place, and time.     Cranial Nerves: No cranial nerve deficit.     Motor: No weakness.     Coordination: Coordination normal.  Psychiatric:        Mood and Affect: Mood normal.      I have personally reviewed the following:   Today's Data  . MRI brain no acute abnormality . Cortisol WNL . TSH WNL  Scheduled Meds: . busPIRone  7.5 mg Oral Daily  . exemestane  25 mg Oral QPC breakfast  . heparin  5,000 Units Subcutaneous Q8H  . polyethylene glycol  17 g Oral BID  . propranolol  40 mg Oral Daily  . senna  1 tablet Oral QHS  . traZODone  100 mg Oral QHS   Continuous Infusions: . sodium chloride 50 mL/hr at 02/19/20 1359    Principal Problem:   Syncope Active Problems:   Vertigo   Acute encephalopathy   Unspecified atrial fibrillation (HCC)   Headache   Thyroid nodule greater than or equal to 1.5 cm in diameter incidentally noted on imaging study   Constipation   LOS: 2 days   How to contact the [facility] Attending or Consulting provider [place] or covering provider during after hours [place], for this patient?  1. Check the care team in [facility] and look for a) attending/consulting TR[facility] provider listed and b) the [facility] team listed 2. Log into [email or web address] and use [place]'s universal password to access. If you do not have the password, please contact the hospital operator. 3. Locate the [facility] provider you are looking for under [facility] and page to a number that you can be directly reached. 4. If you still have difficulty reaching the provider, please page the [facility] (Director on Call) for the Hospitalists listed on amion for assistance.

## 2020-02-21 ENCOUNTER — Inpatient Hospital Stay (HOSPITAL_COMMUNITY): Payer: Medicare (Managed Care)

## 2020-02-21 DIAGNOSIS — R42 Dizziness and giddiness: Secondary | ICD-10-CM | POA: Diagnosis not present

## 2020-02-21 DIAGNOSIS — K5900 Constipation, unspecified: Secondary | ICD-10-CM | POA: Diagnosis not present

## 2020-02-21 DIAGNOSIS — I4891 Unspecified atrial fibrillation: Secondary | ICD-10-CM | POA: Diagnosis not present

## 2020-02-21 LAB — PREGNANCY, URINE: Preg Test, Ur: NEGATIVE

## 2020-02-21 MED ORDER — BISACODYL 10 MG RE SUPP
10.0000 mg | Freq: Every day | RECTAL | Status: DC | PRN
  Administered 2020-02-21: 10 mg via RECTAL
  Filled 2020-02-21: qty 1

## 2020-02-21 MED ORDER — AMOXICILLIN-POT CLAVULANATE 875-125 MG PO TABS
1.0000 | ORAL_TABLET | Freq: Two times a day (BID) | ORAL | Status: AC
  Administered 2020-02-21 – 2020-02-25 (×10): 1 via ORAL
  Filled 2020-02-21 (×10): qty 1

## 2020-02-21 MED ORDER — KETOROLAC TROMETHAMINE 15 MG/ML IJ SOLN
15.0000 mg | Freq: Four times a day (QID) | INTRAMUSCULAR | Status: DC | PRN
  Administered 2020-02-21 – 2020-02-22 (×3): 15 mg via INTRAVENOUS
  Filled 2020-02-21 (×3): qty 1

## 2020-02-21 MED ORDER — METOCLOPRAMIDE HCL 5 MG/ML IJ SOLN
10.0000 mg | Freq: Once | INTRAMUSCULAR | Status: AC
  Administered 2020-02-21: 10 mg via INTRAVENOUS
  Filled 2020-02-21: qty 2

## 2020-02-21 MED ORDER — MECLIZINE HCL 12.5 MG PO TABS
25.0000 mg | ORAL_TABLET | Freq: Three times a day (TID) | ORAL | Status: AC
  Administered 2020-02-21 – 2020-02-22 (×3): 25 mg via ORAL
  Filled 2020-02-21 (×3): qty 2

## 2020-02-21 NOTE — Progress Notes (Signed)
PT Cancellation Note  Patient Details Name: April Acevedo MRN: [id number] DOB: 14-Oct-1975   Cancelled Treatment:    Reason Eval/Treat Not Completed: Pain limiting ability to participate; patient with 10/10 headache per RN and spouse and refusing intervention with student RN and NT earlier.  RN reports call in to MD, but no return call yet.  Will attempt later as time permits.    Reginia Naas 02/21/2020, 10:48 AM Magda Kiel, PT Acute Rehabilitation Services NGFRE:[phone] [phone] 02/21/2020

## 2020-02-21 NOTE — Evaluation (Signed)
Physical Therapy Evaluation Patient Details Name: April Acevedo MRN: [id number] DOB: 1976-01-17 Today's Date: 02/21/2020   History of Present Illness   April Acevedo  is a 44 y.o. female, with history of atrial fibrillation and breast cancer -currently still taking Aromasin and Lupron, presents to the ED with a chief complaint of syncope.  Clinical Impression  Patient presents with decreased mobility due to general weakness associated with dizziness/nausea/vomiting, bedrest, neck and back pain from fall and from motion sensitivity.  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  We did make it walking around the bed and worked on loosening up her neck at the EOB.  Feel she will need follow up initial HHPT then referral to outpatient for vestibular rehab.  Will follow up while in acute setting for progression of activity tolerance and further testing.    Vestibular Assessment - 02/21/20 0001      Vestibular Assessment   General Observation Patient curled up lying on L side in bed, very slow to move and increased time and assist needed.      Symptom Behavior   Subjective history of current problem Reports symptoms began since she has been in the hosptial.  Reports not sure if she hit her head when she fell/passed out at home (spouse states he feels she did not hit her head.)  Describes symptoms of imbalance more than spinning and some light headedness with standing.  States she did have ear drainage/infection on L side prior to admission maybe few days before.  Does not have migraines normally, but today had severe headache which is better due to medication including Meclizine.      Type of Dizziness  Imbalance;Lightheadedness    Frequency of Dizziness constant    Duration of Dizziness hours    Symptom Nature Constant;Variable;Motion provoked    Aggravating Factors Rolling  to right;Rolling to left;Moving eyes;Activity in general    Relieving Factors Lying supine;Closing eyes;Slow movements    Progression of Symptoms Better    History of similar episodes no      Oculomotor Exam   Oculomotor Alignment Normal    Ocular ROM very guarded with moving eyes to either side, but seems to move them relatively smoothly , but since it aggravates her symptoms could not detect nystagmus at end gaze since she closes her eyes    Spontaneous Absent    Gaze-induced  Absent    Smooth Pursuits Intact   but slow and guarded   Saccades Undershoots;Hypometric   guarded with moving her eyes quickly or over large distance   Comment Very symptomatic with eye movements, so limited testing today             Follow Up Recommendations Home health PT;Supervision/Assistance - 24 hour    Equipment Recommendations  Rolling walker with 5" wheels;3in1 (PT)    Recommendations for Other Services       Precautions / Restrictions Precautions Precautions: Fall      Mobility  Bed Mobility Overal bed mobility: Needs Assistance Bed Mobility: Sidelying to Sit;Sit to Sidelying   Sidelying to sit: Mod assist     Sit to sidelying: Min assist General bed mobility comments: already lying on L side assisted for legs off bed and to push up to lift trunk; to side on R pt kept eyes closed and reported symptoms, assist for positioning  Transfers Overall transfer level: Needs assistance Equipment used: 2 person  hand held assist Transfers: Sit to/from Stand Sit to Stand: Mod assist;+2 safety/equipment         General transfer comment: assist for lifting from EOB and for balance  Ambulation/Gait Ambulation/Gait assistance: +2 safety/equipment Gait Distance (Feet): 12 Feet Assistive device: IV Pole;1 person hand held assist Gait Pattern/deviations: Step-through pattern;Decreased stride length;Trunk flexed     General Gait Details: flexed over IV pole looking down but did make it  around the bed to sit on opposite side of the bed with assist to guide IV pole and for balance/security  Stairs            Wheelchair Mobility    Modified Rankin (Stroke Patients Only)       Balance Overall balance assessment: Needs assistance Sitting-balance support: Bilateral upper extremity supported;Feet unsupported Sitting balance-Leahy Scale: Poor Sitting balance - Comments: able to sit and lean back on arms, but fatigues so gave min to minguard A for balance Postural control: Posterior lean Standing balance support: Bilateral upper extremity supported Standing balance-Leahy Scale: Poor Standing balance comment: attempted to let go of her hands in standing and she reached to hold onto IV pole                             Pertinent Vitals/Pain Faces Pain Scale: Hurts whole lot Pain Location: neck Pain Descriptors / Indicators: Aching;Grimacing;Guarding;Tightness Pain Intervention(s): Monitored during session;Repositioned;Heat applied;Relaxation    Home Living Family/patient expects to be discharged to:: Private residence Living Arrangements: Spouse/significant other;Children Available Help at Discharge: Family Type of Home: House Home Access: Stairs to enter Entrance Stairs-Rails: None Entrance Stairs-Number of Steps: 2 Home Layout: Two level;Able to live on main level with bedroom/bathroom Home Equipment: None Additional Comments: son daugther and grandson ( 8 months grandson)    Prior Function Level of Independence: Independent         Comments: 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, newspaper   Dominant Hand: Right    Extremity/Trunk Assessment   Upper Extremity Assessment Upper Extremity Assessment: RUE deficits/detail;LUE deficits/detail RUE Deficits / Details: limited elevation to about 80 degrees at shoulder with grimacing due to pain, likely PROM WFL, but NT due to neck, head pain; FNF slow, not focusing on target so inaccurate due to  lack of focus distracted by symptoms RUE Coordination: decreased gross motor LUE Deficits / Details: limited elevation to about 80 degrees at shoulder with grimacing due to pain, likely PROM WFL, but NT due to neck, head pain, FNF slow, not focusing on target so inaccurate due to lack of focus distracted by symptoms LUE Coordination: decreased gross motor    Lower Extremity Assessment Lower Extremity Assessment: Generalized weakness    Cervical / Trunk Assessment Cervical / Trunk Assessment: Other exceptions Cervical / Trunk Exceptions: stiff neck and elevated shoulders guarding due to pain, dizziness  Communication   Communication: No difficulties  Cognition Arousal/Alertness: Awake/alert Behavior During Therapy: Flat affect Overall Cognitive Status: Impaired/Different from baseline Area of Impairment: Following commands;Problem solving;Attention                   Current Attention Level: Sustained   Following Commands: Follows one step commands consistently;Follows multi-step commands with increased time;Follows one step commands with increased time;Follows one step commands inconsistently     Problem Solving: Slow processing;Decreased initiation;Requires verbal cues;Requires tactile cues        General Comments General comments (skin integrity, edema, etc.): Seated EOB  spouse provided STM to upper traps, PT to L cervical paraspinal for release trigger points due to stiff neck, pain, limited to about 20 degrees L rotation and maybe 45 R rotation    Exercises Other Exercises Other Exercises: Cervical rotation x 5 each way AROM Other Exercises: Educated spouse and pt to try and use elevated HOB especially for meals, but also for improved upright tolerance; also educated about calling to use [facility] with staff assistance for increased OOB activity   Assessment/Plan    PT Assessment Patient needs continued PT services  PT Problem List Decreased strength;Decreased  mobility;Decreased coordination;Decreased balance;Decreased knowledge of use of DME;Pain;Decreased activity tolerance       PT Treatment Interventions DME instruction;Therapeutic activities;Patient/family education;Therapeutic exercise;Gait training;Stair training;Balance training;Functional mobility training    PT Goals (Current goals can be found in the Care Plan section)  Acute Rehab PT Goals Patient Stated Goal: to stop feeling dizzy PT Goal Formulation: With patient/family Time For Goal Achievement: 03/06/20 Potential to Achieve Goals: Good    Frequency Min 3X/week   Barriers to discharge        Co-evaluation PT/OT/SLP Co-Evaluation/Treatment: Yes Reason for Co-Treatment: For patient/therapist safety;To address functional/ADL transfers           AM-PAC PT "6 Clicks" Mobility  Outcome Measure Help needed turning from your back to your side while in a flat bed without using bedrails?: A Little Help needed moving from lying on your back to sitting on the side of a flat bed without using bedrails?: A Lot Help needed moving to and from a bed to a chair (including a wheelchair)?: A Lot Help needed standing up from a chair using your arms (e.g., wheelchair or bedside chair)?: A Lot Help needed to walk in hospital room?: A Lot Help needed climbing 3-5 steps with a railing? : Total 6 Click Score: 12    End of Session   Activity Tolerance: Patient limited by fatigue Patient left: in bed;with call bell/phone within reach;with family/visitor present;with bed alarm set   PT Visit Diagnosis: Other abnormalities of gait and mobility (R26.89);Other symptoms and signs involving the nervous system (R29.898);Pain;Dizziness and giddiness (R42) Pain - Right/Left: Left Pain - part of body: Shoulder (neck)    Time: 1421-1500 PT Time Calculation (min) (ACUTE ONLY): 39 min   Charges:   PT Evaluation $PT Eval Moderate Complexity: 1 Mod PT Treatments $Therapeutic Activity: 8-22 mins         Magda Kiel, PT Acute Rehabilitation Services KDTOI:[phone] [phone] 02/21/2020   Reginia Naas 02/21/2020, 3:53 PM

## 2020-02-21 NOTE — Progress Notes (Signed)
Occupational Therapy Treatment Patient Details Name: April Acevedo MRN: [id number] DOB: [birth year] Today's Date: 02/21/2020    History of present illness  April Acevedo  is a 43 y.o. female, with history of atrial fibrillation and breast cancer -currently still taking Aromasin and Lupron, presents to the ED with a chief complaint of syncope.   OT comments  Pt with new medications provided prior to session and able to progress from supine to movement ambulation around bed surface. Pt remains very guarded and neck pain preventing cervical rotation. Heat applied this session and massage provided to area. Pt using gaze stabilization this session.    Follow Up Recommendations  Outpatient OT    Equipment Recommendations  None recommended by OT    Recommendations for Other Services PT consult    Precautions / Restrictions Precautions Precautions: Fall       Mobility Bed Mobility Overal bed mobility: Needs Assistance Bed Mobility: Sidelying to Sit;Sit to Sidelying   Sidelying to sit: Mod assist   Sit to supine: Min assist Sit to sidelying: Min assist General bed mobility comments: (A) descend to bed surface and BIL LE  Transfers Overall transfer level: Needs assistance Equipment used: 2 person hand held assist Transfers: Sit to/from Stand Sit to Stand: Mod assist;+2 safety/equipment         General transfer comment: assist for lifting from EOB and for balance    Balance Overall balance assessment: Needs assistance Sitting-balance support: Bilateral upper extremity supported;Feet supported Sitting balance-Leahy Scale: Poor Sitting balance - Comments: (A) to sustain static sitting and decreased tension on neck to allow for neck rotation movement Postural control: Posterior lean Standing balance support: Bilateral upper extremity supported;During functional activity Standing balance-Leahy Scale: Poor Standing balance comment: reliance on external support                            ADL either performed or assessed with clinical judgement   ADL Overall ADL's : Needs assistance/impaired                                       General ADL Comments: pt progressed from supine to movement around the bed surface this session two person (A). pt with increased activity tolerance noted this session compared to previous session     Vision       Perception     Praxis      Cognition Arousal/Alertness: Awake/alert Behavior During Therapy: Flat affect Overall Cognitive Status: Impaired/Different from baseline Area of Impairment: Memory                   Current Attention Level: Sustained Memory: Decreased short-term memory Following Commands: Follows one step commands consistently;Follows multi-step commands with increased time;Follows one step commands with increased time;Follows one step commands inconsistently     Problem Solving: Slow processing;Decreased initiation;Requires verbal cues;Requires tactile cues General Comments: pt uncertain of details of times during session.         Exercises Other Exercises Other Exercises: Cervical rotation x 5 each way AROM Other Exercises: Educated spouse and pt to try and use elevated HOB especially for meals, but also for improved upright tolerance; also educated about calling to use [facility] with staff assistance for increased OOB activity   Shoulder Instructions       General Comments Seated EOB spouse provided STM to upper traps, PT to  L cervical paraspinal for release trigger points due to stiff neck, pain, limited to about 20 degrees L rotation and maybe 45 R rotation    Pertinent Vitals/ Pain       Pain Assessment: Faces Faces Pain Scale: Hurts whole lot Pain Location: neck Pain Descriptors / Indicators: Aching;Grimacing;Guarding;Tightness Pain Intervention(s): Repositioned;Heat applied  Home Living Family/patient expects to be discharged to:: Private  residence Living Arrangements: Spouse/significant other;Children Available Help at Discharge: Family Type of Home: House Home Access: Stairs to enter Technical brewer of Steps: 2 Entrance Stairs-Rails: None Home Layout: Two level;Able to live on main level with bedroom/bathroom     Bathroom Shower/Tub: Occupational psychologist: Standard     Home Equipment: None   Additional Comments: son daugther and grandson ( 20 months grandson)  Lives With: Spouse;Son    Prior Functioning/Environment Level of Independence: Independent        Comments: 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, engineering 2X/week        Progress Toward Goals  OT Goals(current goals can now be found in the care plan section)  Progress towards OT goals: Progressing toward goals  Acute Rehab OT Goals Patient Stated Goal: to stop feeling dizzy OT Goal Formulation: With patient/family Time For Goal Achievement: 03/05/20 Potential to Achieve Goals: Good ADL Goals Pt Will Perform Grooming: with supervision;sitting Pt Will Transfer to Toilet: with min assist;bedside commode;stand pivot transfer Additional ADL Goal #1: pt will complete sink level grooming task Min guard (A)  Plan Discharge plan remains appropriate    Co-evaluation    PT/OT/SLP Co-Evaluation/Treatment: Yes Reason for Co-Treatment: For patient/therapist safety   OT goals addressed during session: ADL's and self-care      AM-PAC OT "6 Clicks" Daily Activity     Outcome Measure   Help from another person eating meals?: A Little Help from another person taking care of personal grooming?: A Little Help from another person toileting, which includes using toliet, bedpan, or urinal?: A Lot Help from another person bathing (including washing, rinsing, drying)?: A Lot Help from another person to put on and taking off regular upper body clothing?: A Little Help from another person to put on and taking off regular lower body clothing?:  A Lot 6 Click Score: 15    End of Session    OT Visit Diagnosis: Unsteadiness on feet (R26.81);Pain   Activity Tolerance Patient tolerated treatment well   Patient Left in bed;with call bell/phone within reach;with bed alarm set;with family/visitor present   Nurse Communication Mobility status;Precautions        Time: 1430-1456 OT Time Calculation (min): 26 min  Charges: OT General Charges $OT Visit: 1 Visit OT Treatments $Self Care/Home Management : 8-22 mins   Brynn, OTR/L  Acute Rehabilitation Services Pager: [phone] Office: [phone] .    Jeri Modena 02/21/2020, 4:00 PM

## 2020-02-21 NOTE — Progress Notes (Signed)
PROGRESS NOTE  Masako Overall XFG:[id number] DOB: [birth year] DOA: 02/18/2020 PCP: Patient, No Pcp Per  Brief History   44 year old woman PMH atrial fibrillation not on anticoagulation, breast cancer taking Aromasin and Lupron presented after loss of consciousness at home.  Was standing cooking, singing and lost consciousness.  No observed seizure activity.  Multiple episodes of urinary incontinence afterwards. Admitted for further evaluation of syncope, loss of consciousness, and neurologic evaluation   A & P  Syncope --Etiology unclear.  Husband describes several hours of confusion afterwards.  Does have a family history of seizure disorder and daughter and sister.  No personal history of head injury in the past or seizures.  MRI of the brain and CT head and neck unremarkable.  EEG normal.  TSH and cortisol unremarkable.  Telemetry unrevealing.  Echocardiogram and EKG unremarkable. --Husband describes several hours of confusion afterwards. --Neurology evaluated, suspicion for future seizure was low and recommendation was against starting AED.  Did recommend outpatient neurology follow-up.  If has another spell, start Keppra. --Recommend she discuss anticoagulation for atrial fibrillation with her cardiologist and oncologist. --No driving for 6 months with full seizure precautions, see neurology note 10/18 to provide patient with instructions on discharge.  Vertigo --Suspected to be peripheral, PT evaluation appreciated.  Patient has difficulty participating secondary to vertigo. --Trial of meclizine  Acute encephalopathy --Resolved.  Etiology unclear.  Metabolic and imaging work-up unremarkable.  Cannot rule out post ictal phenomenon although no evidence of seizure at this time.  PMH atrial fibrillation --Remains in sinus rhythm on telemetry.  Frontal headache --Imaging negative.  No concerning features.  Ketorolac, add Reglan x1. --We will treat for sinusitis empirically with  Augmentin.  2.7 cm left thyroid nodule. Recommend thyroid US as outpatient --TSH within normal limits  Heterogeneous appearance of the salivary glands with numerous punctate parotid calcifications which may reflect an underlying inflammatory or autoimmune conditions such as Sjogren disease. --Patient denies complaints  Constipation x1 week --no concerning features, abdominal x-ray nonacute.  Successful bowel movement today after suppository. --Continue bowel regimen  Disposition Plan:  Discussion: No opportunity for discharge secondary to severe vertigo, poor appetite, ongoing headache  Status is: Inpatient  Remains inpatient appropriate because:Inpatient level of care appropriate due to severity of illness  Dispo: The patient is from: Home              Anticipated d/c is to: Home              Anticipated d/c date is: 2 days              Patient currently is not medically stable to d/c.  DVT prophylaxis: heparin injection 5,000 Units Start: 02/18/20 2345 SCD's Start: 02/18/20 2344   Code Status: Full Code Family Communication: husband at bedside again 10/19  Murray Hodgkins, MD  [facility] Direct contact: see [email or web address] (further directions at bottom of note if needed) 7PM-7AM contact night coverage as at bottom of note 02/21/2020, 5:33 PM  LOS: 3 days   Significant Hospital Events   .    Consults:  . Neurology    Procedures:  .   Significant Diagnostic Tests:  Marland Kitchen    Micro Data:  .    Antimicrobials:  .   Interval History/Subjective  CC: f/u syncope  Severe frontal headache above the eyes. Bilateral neck anterior swelling and pain. Had ear infection 2 weeks ago.  Spontaneously resolved.  Long history of multiple ear infections and ear surgeries.  No previous history of  significant headaches.  Objective   Vitals:  Vitals:   02/21/20 1146 02/21/20 1539  BP: 107/71 94/62  Pulse: (!) 54 (!) 53  Resp: 18 18  Temp: 98.4 F (36.9 C) 98.4 F (36.9 C)   SpO2: 100% 99%    Exam: Constitutional:   . Appears uncomfortable, ill, nontoxic Eyes:  . pupils and irises appear normal ENMT:  . grossly normal hearing  . Lips appear normal . external ears, nose appear normal . Bilateral tympanic membranes are scarred but otherwise appear unremarkable Neck:  . She has neck fullness bilaterally right greater than left, lymphadenopathy anteriorly, tender to touch Respiratory:  . CTA bilaterally, no w/r/r.  . Respiratory effort normal.  Cardiovascular:  . RRR, no m/r/g . No LE extremity edema   Abdomen:  . Soft Musculoskeletal:  . RUE, LUE, RLE, LLE   o strength and tone grossly normal, no atrophy, no abnormal movements Skin:  . No rashes, lesions, ulcers . palpation of skin: no induration or nodules Neurologic:  . Grossly nonfocal Psychiatric:  . Mental status o Mood, affect appropriate  I have personally reviewed the following:   Today's Data  . Urine pregnancy negative  Scheduled Meds: . amoxicillin-clavulanate  1 tablet Oral Q12H  . busPIRone  7.5 mg Oral Daily  . exemestane  25 mg Oral QPC breakfast  . heparin  5,000 Units Subcutaneous Q8H  . meclizine  25 mg Oral TID  . polyethylene glycol  17 g Oral BID  . propranolol  40 mg Oral Daily  . senna  1 tablet Oral QHS  . traZODone  100 mg Oral QHS   Continuous Infusions:   Principal Problem:   Syncope Active Problems:   Vertigo   Acute encephalopathy   Unspecified atrial fibrillation (HCC)   Headache   Thyroid nodule greater than or equal to 1.5 cm in diameter incidentally noted on imaging study   Constipation   LOS: 3 days   How to contact the [facility] Attending or Consulting provider [place] or covering provider during after hours [place], for this patient?  1. Check the care team in [facility] and look for a) attending/consulting TR[facility] provider listed and b) the [facility] team listed 2. Log into [email or web address] and use [place]'s universal password to access. If you do not have  the password, please contact the hospital operator. 3. Locate the [facility] provider you are looking for under [facility] and page to a number that you can be directly reached. 4. If you still have difficulty reaching the provider, please page the [facility] (Director on Call) for the Hospitalists listed on amion for assistance.

## 2020-02-21 NOTE — Progress Notes (Signed)
OT Cancellation Note  Patient Details Name: April Acevedo MRN: [id number] DOB: 04-14-1976   Cancelled Treatment:    Reason Eval/Treat Not Completed: Patient not medically ready (10/10 pain) Pain limiting ability to participate; patient with 10/10 headache per RN and spouse and refusing intervention with student RN and NT earlier.  RN reports call in to MD, but no return call yet.  Will attempt later as time permits.   Billey Chang, OTR/L  Acute Rehabilitation Services Pager: [phone] Office: [phone] .  02/21/2020, 12:14 PM

## 2020-02-22 DIAGNOSIS — I4891 Unspecified atrial fibrillation: Secondary | ICD-10-CM | POA: Diagnosis not present

## 2020-02-22 DIAGNOSIS — G4489 Other headache syndrome: Secondary | ICD-10-CM

## 2020-02-22 DIAGNOSIS — R42 Dizziness and giddiness: Secondary | ICD-10-CM | POA: Diagnosis not present

## 2020-02-22 LAB — GLUCOSE, CAPILLARY: Glucose-Capillary: 120 mg/dL — ABNORMAL HIGH (ref 70–99)

## 2020-02-22 MED ORDER — DIPHENHYDRAMINE HCL 50 MG/ML IJ SOLN
12.5000 mg | Freq: Once | INTRAMUSCULAR | Status: AC
  Administered 2020-02-22: 12.5 mg via INTRAVENOUS
  Filled 2020-02-22: qty 1

## 2020-02-22 MED ORDER — KETOROLAC TROMETHAMINE 30 MG/ML IJ SOLN
30.0000 mg | Freq: Once | INTRAMUSCULAR | Status: AC
  Administered 2020-02-22: 30 mg via INTRAVENOUS
  Filled 2020-02-22: qty 1

## 2020-02-22 MED ORDER — METOCLOPRAMIDE HCL 5 MG/ML IJ SOLN
10.0000 mg | Freq: Once | INTRAMUSCULAR | Status: AC
  Administered 2020-02-22: 10 mg via INTRAVENOUS
  Filled 2020-02-22: qty 2

## 2020-02-22 NOTE — Progress Notes (Addendum)
PROGRESS NOTE        PATIENT DETAILS Name: April Acevedo Age: 44 y.o. Sex: female Date of Birth: [birth year] Admit Date: 02/18/2020 Admitting Physician Rolla Plate, DO XLK:[id number], No Pcp Per  Brief Narrative: Patient is a 45 y.o. female PAF-not on anticoagulation, breast cancer-presented to the hospital following a syncopal episode-further hospital course complicated by peripheral vertigo and frontal headaches.  See below for further details.  Significant events: 10/16>> admit to [facility] for evaluation of syncope  Significant studies: 10/16>> CT head: No acute abnormalities 10/16>> CTA head and neck: No large vessel occlusion or stenosis or aneurysm in the head and neck. 10/17>> MRI brain: No metastatic disease acute intracranial abnormality 10/17>> Echo: EF 55-60% 10/19>> X-ray abdomen: No acute abnormality  Antimicrobial therapy: None  Microbiology data: None  Procedures : 10/18>> EEG: No seizure activity.  Consults: Neurology  DVT Prophylaxis : heparin injection 5,000 Units Start: 02/18/20 2345 SCD's Start: 02/18/20 2344  Subjective: Continues to have positional vertigo-when she is stationary-she does not have dizziness, but the moment she sits up, or turns side-to-side-she starts having significant vertigo.  Continues to have frontal headaches-7/10-with significant phonophobia and photophobia (room dark)  Assessment/Plan: Syncope: Etiology unclear-some concern for seizures-evaluated by neurology-advised to hold off on AEDs unless patient has recurrent symptoms.  Telemetry negative for arrhythmia-echo with preserved EF.  Per neurology-no driving for 6 months with full seizure precautions.  Vertigo: Appears to be very positional-likely BPPV-MRI brain without acute abnormalities.  No significant response to meclizine so far-continue supportive care and attempts at vestibular PT.  Headache: Has some features consistent with migrainous  etiology-trial of Toradol/Reglan/Benadryl today-if no response-we will try Depacon.  Remains on empiric Augmentin given sinusitis features seen on imaging studies.  PAF: Remains in sinus rhythm-Per prior documentation-not on anticoagulation due to interaction with her cancer medications.  History of breast cancer: Continue aromasin and outpatient follow-up with primary oncology  3 mm left upper lung nodule: Stable for outpatient follow-up  2.7 cm left thyroid nodule: Stable for outpatient follow-up-TSH within normal limits.  Abnormal appearance of salivary gland and imaging studies: No clinical features consistent with Sjogren's symptoms-doubt further work-up required while inpatient   Diet: Diet Order            Diet regular Room service appropriate? Yes; Fluid consistency: Thin  Diet effective now                  Code Status: Full code   Family Communication: None at bedside  Disposition Plan: Status is: Inpatient  Remains inpatient appropriate because:Inpatient level of care appropriate due to severity of illness   Dispo: The patient is from: Home              Anticipated d/c is to: Home              Anticipated d/c date is: 2 days              Patient currently is not medically stable to d/c.  Barriers to Discharge: Ongoing severe vertigo-headache-needing inpatient therapies-see above.  Antimicrobial agents: Anti-infectives (From admission, onward)   Start     Dose/Rate Route Frequency Ordered Stop   02/21/20 1345  amoxicillin-clavulanate (AUGMENTIN) 875-125 MG per tablet 1 tablet        1 tablet Oral Every 12 hours 02/21/20 1258  Time spent: 25- minutes-Greater than 50% of this time was spent in counseling, explanation of diagnosis, planning of further management, and coordination of care.  MEDICATIONS: Scheduled Meds: . amoxicillin-clavulanate  1 tablet Oral Q12H  . busPIRone  7.5 mg Oral Daily  . ketorolac  30 mg Intravenous Once   Followed by   . metoCLOPramide (REGLAN) injection  10 mg Intravenous Once   Followed by  . diphenhydrAMINE  12.5 mg Intravenous Once  . exemestane  25 mg Oral QPC breakfast  . heparin  5,000 Units Subcutaneous Q8H  . polyethylene glycol  17 g Oral BID  . propranolol  40 mg Oral Daily  . senna  1 tablet Oral QHS  . traZODone  100 mg Oral QHS   Continuous Infusions: PRN Meds:.acetaminophen **OR** acetaminophen (TYLENOL) oral liquid 160 mg/5 mL **OR** acetaminophen, bisacodyl, ketorolac, ondansetron (ZOFRAN) IV   PHYSICAL EXAM: Vital signs: Vitals:   02/21/20 2346 02/22/20 0403 02/22/20 0739 02/22/20 1120  BP: 99/64 96/63 91/61  105/67  Pulse: 61 (!) 59 (!) 54 (!) 56  Resp: 18 19 16 18   Temp: 98.4 F (36.9 C) 98.2 F (36.8 C) 98.4 F (36.9 C) 98.3 F (36.8 C)  TempSrc: Oral Axillary Oral Oral  SpO2: 97% 97% 98% 98%  Weight:      Height:       Filed Weights   02/18/20 1826  Weight: 54.4 kg   Body mass index is 19.97 kg/m.   Gen Exam:Alert awake-not in any distress HEENT:atraumatic, normocephalic Chest: B/L clear to auscultation anteriorly CVS:S1S2 regular Abdomen:soft non tender, non distended Extremities:no edema Neurology: Non focal Skin: no rash  I have personally reviewed following labs and imaging studies  LABORATORY DATA: CBC: Recent Labs  Lab 02/18/20 1910 02/19/20 0446  WBC 3.9* 3.1*  NEUTROABS 2.0  --   HGB 13.5 11.5*  HCT 41.4 34.7*  MCV 82.3 82.4  PLT 212 097    Basic Metabolic Panel: Recent Labs  Lab 02/18/20 1910 02/19/20 0446  NA 135 138  K 3.5 3.7  CL 103 110  CO2 26 24  GLUCOSE 67* 121*  BUN 12 12  CREATININE 1.00 0.94  CALCIUM 9.1 8.1*    GFR: Estimated Creatinine Clearance: 64.9 mL/min (by C-G formula based on SCr of 0.94 mg/dL).  Liver Function Tests: Recent Labs  Lab 02/18/20 1910 02/19/20 0446  AST 26 21  ALT 17 14  ALKPHOS 54 45  BILITOT 0.6 0.3  PROT 9.2* 7.4  ALBUMIN 4.2 3.2*   No results for input(s): LIPASE, AMYLASE  in the last 168 hours. No results for input(s): AMMONIA in the last 168 hours.  Coagulation Profile: Recent Labs  Lab 02/18/20 1910  INR 1.0    Cardiac Enzymes: No results for input(s): CKTOTAL, CKMB, CKMBINDEX, TROPONINI in the last 168 hours.  BNP (last 3 results) No results for input(s): PROBNP in the last 8760 hours.  Lipid Profile: No results for input(s): CHOL, HDL, LDLCALC, TRIG, CHOLHDL, LDLDIRECT in the last 72 hours.  Thyroid Function Tests: Recent Labs    02/20/20 1032  TSH 1.062    Anemia Panel: No results for input(s): VITAMINB12, FOLATE, FERRITIN, TIBC, IRON, RETICCTPCT in the last 72 hours.  Urine analysis: No results found for: COLORURINE, APPEARANCEUR, LABSPEC, PHURINE, GLUCOSEU, HGBUR, BILIRUBINUR, KETONESUR, PROTEINUR, UROBILINOGEN, NITRITE, LEUKOCYTESUR  Sepsis Labs: Lactic Acid, Venous No results found for: LATICACIDVEN  MICROBIOLOGY: Recent Results (from the past 240 hour(s))  Respiratory Panel by RT PCR (Flu A&B, Covid) - Nasopharyngeal Swab  Status: None   Collection Time: 02/18/20  7:33 PM   Specimen: Nasopharyngeal Swab  Result Value Ref Range Status   SARS Coronavirus 2 by RT PCR NEGATIVE NEGATIVE Final    Comment: (NOTE) SARS-CoV-2 target nucleic acids are NOT DETECTED.  The SARS-CoV-2 RNA is generally detectable in upper respiratoy specimens during the acute phase of infection. The lowest concentration of SARS-CoV-2 viral copies this assay can detect is 131 copies/mL. A negative result does not preclude SARS-Cov-2 infection and should not be used as the sole basis for treatment or other patient management decisions. A negative result may occur with  improper specimen collection/handling, submission of specimen other than nasopharyngeal swab, presence of viral mutation(s) within the areas targeted by this assay, and inadequate number of viral copies (<131 copies/mL). A negative result must be combined with clinical observations,  patient history, and epidemiological information. The expected result is Negative.  Fact Sheet for Patients:  PinkCheek.be  Fact Sheet for Healthcare Providers:  GravelBags.it  This test is no t yet approved or cleared by the [place] FDA and  has been authorized for detection and/or diagnosis of SARS-CoV-2 by FDA under an Emergency Use Authorization (EUA). This EUA will remain  in effect (meaning this test can be used) for the duration of the COVID-19 declaration under Section 564(b)(1) of the Act, 21 U.S.C. section 360bbb-3(b)(1), unless the authorization is terminated or revoked sooner.     Influenza A by PCR NEGATIVE NEGATIVE Final   Influenza B by PCR NEGATIVE NEGATIVE Final    Comment: (NOTE) The Xpert Xpress SARS-CoV-2/FLU/RSV assay is intended as an aid in  the diagnosis of influenza from Nasopharyngeal swab specimens and  should not be used as a sole basis for treatment. Nasal washings and  aspirates are unacceptable for Xpert Xpress SARS-CoV-2/FLU/RSV  testing.  Fact Sheet for Patients: PinkCheek.be  Fact Sheet for Healthcare Providers: GravelBags.it  This test is not yet approved or cleared by the [place] FDA and  has been authorized for detection and/or diagnosis of SARS-CoV-2 by  FDA under an Emergency Use Authorization (EUA). This EUA will remain  in effect (meaning this test can be used) for the duration of the  Covid-19 declaration under Section 564(b)(1) of the Act, 21  U.S.C. section 360bbb-3(b)(1), unless the authorization is  terminated or revoked. Performed at [facility], [address], [place], [place] [address]     RADIOLOGY STUDIES/RESULTS: DG Abd Portable 1V  Result Date: 02/21/2020 CLINICAL DATA:  Constipation.  Distention.  Prior stroke. EXAM: PORTABLE ABDOMEN - 1 VIEW COMPARISON:  No prior. FINDINGS: Soft tissue  structures are unremarkable. No bowel distention. Stool noted throughout the colon. No acute bony abnormality. Pelvic calcifications consistent phleboliths. IMPRESSION: No acute abnormality. No bowel distention. Stool noted throughout the colon. Electronically Signed   By: Marcello Moores  Register   On: 02/21/2020 11:57     LOS: 4 days   Oren Binet, MD  [facility]    To contact the attending provider between 7A-7P or the covering provider during after hours 7P-7A, please log into the web site [email or web address] and access using universal [place] password for that web site. If you do not have the password, please call the hospital operator.  02/22/2020, 2:15 PM

## 2020-02-22 NOTE — Progress Notes (Signed)
PT Cancellation Note  Patient Details Name: April Acevedo MRN: [id number] DOB: 05-Jun-1975   Cancelled Treatment:    Reason Eval/Treat Not Completed: Pain limiting ability to participate.  Pt reports HA pain is too severe to participate in PT at this time.  She reports her new medications the MD ordered have not come up from the pharmacy yet.  PT to check back tomorrow.  Please try to premedicate with whatever she has for HA.    Thanks,  Verdene Lennert, PT, DPT  Acute Rehabilitation [phone] pager #[phone] office       April Acevedo 02/22/2020, 5:49 PM

## 2020-02-23 ENCOUNTER — Inpatient Hospital Stay (HOSPITAL_COMMUNITY): Payer: Medicare (Managed Care)

## 2020-02-23 DIAGNOSIS — I4891 Unspecified atrial fibrillation: Secondary | ICD-10-CM | POA: Diagnosis not present

## 2020-02-23 DIAGNOSIS — G4489 Other headache syndrome: Secondary | ICD-10-CM | POA: Diagnosis not present

## 2020-02-23 DIAGNOSIS — R42 Dizziness and giddiness: Secondary | ICD-10-CM | POA: Diagnosis not present

## 2020-02-23 LAB — COMPREHENSIVE METABOLIC PANEL
ALT: 19 U/L (ref 0–44)
AST: 25 U/L (ref 15–41)
Albumin: 3 g/dL — ABNORMAL LOW (ref 3.5–5.0)
Alkaline Phosphatase: 40 U/L (ref 38–126)
Anion gap: 6 (ref 5–15)
BUN: 15 mg/dL (ref 6–20)
CO2: 23 mmol/L (ref 22–32)
Calcium: 8.7 mg/dL — ABNORMAL LOW (ref 8.9–10.3)
Chloride: 112 mmol/L — ABNORMAL HIGH (ref 98–111)
Creatinine, Ser: 0.9 mg/dL (ref 0.44–1.00)
GFR, Estimated: >60 mL/min (ref >60)
Glucose, Bld: 93 mg/dL (ref 70–99)
Potassium: 3.8 mmol/L (ref 3.5–5.1)
Sodium: 141 mmol/L (ref 135–145)
Total Bilirubin: 0.3 mg/dL (ref 0.3–1.2)
Total Protein: 6.9 g/dL (ref 6.5–8.1)

## 2020-02-23 LAB — CBC
HCT: 35.9 % — ABNORMAL LOW (ref 36.0–46.0)
Hemoglobin: 11.4 g/dL — ABNORMAL LOW (ref 12.0–15.0)
MCH: 26.6 pg (ref 26.0–34.0)
MCHC: 31.8 g/dL (ref 30.0–36.0)
MCV: 83.9 fL (ref 80.0–100.0)
Platelets: 151 10*3/uL (ref 150–400)
RBC: 4.28 MIL/uL (ref 3.87–5.11)
RDW: 13 % (ref 11.5–15.5)
WBC: 3.2 10*3/uL — ABNORMAL LOW (ref 4.0–10.5)
nRBC: 0 % (ref 0.0–0.2)

## 2020-02-23 MED ORDER — DEXAMETHASONE SODIUM PHOSPHATE 10 MG/ML IJ SOLN
10.0000 mg | Freq: Once | INTRAMUSCULAR | Status: AC
  Administered 2020-02-23: 10 mg via INTRAVENOUS
  Filled 2020-02-23: qty 1

## 2020-02-23 MED ORDER — IOHEXOL 350 MG/ML SOLN
75.0000 mL | Freq: Once | INTRAVENOUS | Status: AC | PRN
  Administered 2020-02-23: 75 mL via INTRAVENOUS

## 2020-02-23 MED ORDER — HYDROCODONE-ACETAMINOPHEN 5-325 MG PO TABS
1.0000 | ORAL_TABLET | ORAL | Status: AC | PRN
  Administered 2020-02-23 – 2020-02-24 (×2): 2 via ORAL
  Filled 2020-02-23 (×2): qty 2

## 2020-02-23 MED ORDER — MAGIC MOUTHWASH
5.0000 mL | Freq: Three times a day (TID) | ORAL | Status: DC | PRN
  Administered 2020-02-23 – 2020-02-28 (×13): 5 mL via ORAL
  Filled 2020-02-23 (×16): qty 5

## 2020-02-23 MED ORDER — MORPHINE SULFATE (PF) 4 MG/ML IV SOLN: 4.0000 mg | INTRAVENOUS | Status: DC | PRN

## 2020-02-23 NOTE — Progress Notes (Signed)
Physical Therapy Treatment Patient Details Name: April Acevedo MRN: [id number] DOB: [birth year] Today's Date: 02/23/2020    History of Present Illness  April Acevedo  is a 44 y.o. female, with history of atrial fibrillation and breast cancer -currently still taking Aromasin and Lupron, presents to the ED with a chief complaint of syncope.    PT Comments    Pt admitted with above diagnosis. PT was able to use compensatory strategies to come to EOB and to get into chair. Pt and   Husband educated in how to use target "A's" in room to help pt move to 3N1 and up to chair.  Pt dizziness 3/10 initially, up to 8/10 with transfer and 5/10 sitting in chair. Will continue to progress pt as pt tolerates.  Pt currently with functional limitations due to balance and endurance deficits. Pt will benefit from skilled PT to increase their independence and safety with mobility to allow discharge to the venue listed below.     Follow Up Recommendations  Home health PT;Supervision/Assistance - 24 hour     Equipment Recommendations  Rolling walker with 5" wheels;3in1 (PT)    Recommendations for Other Services       Precautions / Restrictions Precautions Precautions: Fall Restrictions Weight Bearing Restrictions: No    Mobility  Bed Mobility Overal bed mobility: Needs Assistance Bed Mobility: Sidelying to Sit;Rolling Rolling: Min guard Sidelying to sit: Min assist       General bed mobility comments: Pt was able to come to EOB while looking at "A" target for compensation.  Pt took incr time and a little assist with pad but was able to come to EOB.    Transfers Overall transfer level: Needs assistance Equipment used: 2 person hand held assist Transfers: Sit to/from [facility] Sit to Stand: Min assist;Min guard Stand pivot transfers: Min guard;Min assist       General transfer comment: Pt was able to stand to her feet with HHA and pivot to chair. Instructed pt to  keep her eyes on "A" target to compensate as she is dizzy if she doesnt.   Ambulation/Gait                 Stairs             Wheelchair Mobility    Modified Rankin (Stroke Patients Only)       Balance Overall balance assessment: Needs assistance Sitting-balance support: Feet supported;No upper extremity supported Sitting balance-Leahy Scale: Fair Sitting balance - Comments: able to sit EOB as long as she was looking at target "A"   Standing balance support: Bilateral upper extremity supported;During functional activity Standing balance-Leahy Scale: Poor Standing balance comment: reliance on external support                            Cognition Arousal/Alertness: Awake/alert Behavior During Therapy: Flat affect Overall Cognitive Status: Impaired/Different from baseline Area of Impairment: Memory                   Current Attention Level: Sustained Memory: Decreased short-term memory Following Commands: Follows one step commands consistently;Follows multi-step commands with increased time     Problem Solving: Decreased initiation;Requires verbal cues;Requires tactile cues        Exercises      General Comments General comments (skin integrity, edema, etc.): VSS, Attempted x 1 exercises however pt unable to perform without severe dizziness. Decided it best to just allow pt to  get into chair and use compensation to incr tolerance of sitting up.  Placed target "A's" in room for pt to use compensation to decr dizziness.  Pt dizziness initially 3/10 in supine, 8/10 with transfer and 5/10 on departure with pt sitting in chair.        Pertinent Vitals/Pain Pain Assessment: Faces Faces Pain Scale: Hurts little more Pain Location: headache Pain Descriptors / Indicators: Aching;Grimacing;Guarding;Tightness Pain Intervention(s): Limited activity within patient's tolerance;Monitored during session;Repositioned    Home Living                       Prior Function            PT Goals (current goals can now be found in the care plan section) Acute Rehab PT Goals Patient Stated Goal: to stop feeling dizzy Progress towards PT goals: Progressing toward goals    Frequency    Min 3X/week      PT Plan Current plan remains appropriate    Co-evaluation              AM-PAC PT "6 Clicks" Mobility   Outcome Measure  Help needed turning from your back to your side while in a flat bed without using bedrails?: A Little Help needed moving from lying on your back to sitting on the side of a flat bed without using bedrails?: A Lot Help needed moving to and from a bed to a chair (including a wheelchair)?: A Lot Help needed standing up from a chair using your arms (e.g., wheelchair or bedside chair)?: A Lot Help needed to walk in hospital room?: A Lot Help needed climbing 3-5 steps with a railing? : Total 6 Click Score: 12    End of Session Equipment Utilized During Treatment: Gait belt Activity Tolerance: Patient limited by fatigue Patient left: with call bell/phone within reach;with family/visitor present;in chair Nurse Communication: Mobility status PT Visit Diagnosis: Other abnormalities of gait and mobility (R26.89);Other symptoms and signs involving the nervous system (R29.898);Pain;Dizziness and giddiness (R42)     Time: 1130-1156 PT Time Calculation (min) (ACUTE ONLY): 26 min  Charges:  $Therapeutic Exercise: 8-22 mins $Therapeutic Activity: 8-22 mins                     Ilia Dimaano W,PT Acute Rehabilitation Services Pager:  [phone]  Office:  [place] 02/23/2020, 1:37 PM

## 2020-02-23 NOTE — Progress Notes (Signed)
PROGRESS NOTE        PATIENT DETAILS Name: April Acevedo Age: 43 y.o. Sex: female Date of Birth: 23-May-1975 Admit Date: 02/18/2020 Admitting Physician Rolla Plate, DO ELF:[id number], No Pcp Per  Brief Narrative: Patient is a 44 y.o. female PAF-not on anticoagulation, breast cancer-presented to the hospital following a syncopal episode-further hospital course complicated by peripheral vertigo and frontal headaches.  See below for further details.  Significant events: 10/16>> admit to [facility] for evaluation of syncope  Significant studies: 10/16>> CT head: No acute abnormalities 10/16>> CTA head and neck: No large vessel occlusion or stenosis or aneurysm in the head and neck. 10/17>> MRI brain: No metastatic disease acute intracranial abnormality 10/17>> Echo: EF 55-60% 10/19>> X-ray abdomen: No acute abnormality  Antimicrobial therapy: None  Microbiology data: None  Procedures : 10/18>> EEG: No seizure activity.  Consults: Neurology  DVT Prophylaxis : heparin injection 5,000 Units Start: 02/18/20 2345 SCD's Start: 02/18/20 2344  Subjective: Continues to have persistent headaches-mostly in the frontal area-vertigo continues.  Otherwise appears comfortable-until she starts moving around in bed when the vertigo and headache worsens.  Assessment/Plan: Syncope: Etiology unclear-some concern for seizures-evaluated by neurology-advised to hold off on AEDs unless patient has recurrent symptoms.  Telemetry negative for arrhythmia-echo with preserved EF.  Per neurology-no driving for 6 months with full seizure precautions.  Vertigo: Vertigo appears positional-MRI brain without any acute abnormalities.  No response to meclizine-given history of breast cancer-on Aromasin-persistent headaches-discussed with neurology-CT venogram brain ordered to rule out dural sinus venous thrombosis.  Headache: Continues to have frontal headaches-7/10-has no prior  history of migraine headaches-although current headache has some migrainous features (likes room to be quiet/dark-endorses photophobia)-responded minimally to cocktail of Toradol/Reglan/Benadryl for only 2 hours.  We will go and give 1 dose of Decadron 10 mg today.  As noted above-given persistent headache/vertigo-on Aromasin-history of breast cancer-CT venogram brain ordered to rule out dural sinus venous thrombosis.  PAF: Remains in sinus rhythm-Per prior documentation-not on anticoagulation due to interaction with her cancer medications.  History of breast cancer-s/p double mastectomy 4 years back in [place]: Maintained on Aromasin and Lupron-we will need to establish with outpatient oncology as patient just moved into this area.  Apparently has not taken Lupron in the past several months.  3 mm left upper lung nodule: Stable for outpatient follow-up  2.7 cm left thyroid nodule: Stable for outpatient follow-up-TSH within normal limits.  Abnormal appearance of salivary gland and imaging studies: No clinical features consistent with Sjogren's symptoms-doubt further work-up required while inpatient   Diet: Diet Order            Diet regular Room service appropriate? Yes; Fluid consistency: Thin  Diet effective now                  Code Status: Full code   Family Communication: Spouse at bedside  Disposition Plan: Status is: Inpatient  Remains inpatient appropriate because:Inpatient level of care appropriate due to severity of illness   Dispo: The patient is from: Home              Anticipated d/c is to: Home              Anticipated d/c date is: 2 days              Patient currently is not medically stable to  d/c.  Barriers to Discharge: Ongoing severe vertigo-headache-needing inpatient therapies-see above.  Antimicrobial agents: Anti-infectives (From admission, onward)   Start     Dose/Rate Route Frequency Ordered Stop   02/21/20 1345  amoxicillin-clavulanate (AUGMENTIN)  875-125 MG per tablet 1 tablet        1 tablet Oral Every 12 hours 02/21/20 1258         Time spent: 25- minutes-Greater than 50% of this time was spent in counseling, explanation of diagnosis, planning of further management, and coordination of care.  MEDICATIONS: Scheduled Meds: . amoxicillin-clavulanate  1 tablet Oral Q12H  . busPIRone  7.5 mg Oral Daily  . exemestane  25 mg Oral QPC breakfast  . heparin  5,000 Units Subcutaneous Q8H  . polyethylene glycol  17 g Oral BID  . propranolol  40 mg Oral Daily  . senna  1 tablet Oral QHS  . traZODone  100 mg Oral QHS   Continuous Infusions: PRN Meds:.acetaminophen **OR** acetaminophen (TYLENOL) oral liquid 160 mg/5 mL **OR** acetaminophen, bisacodyl, ondansetron (ZOFRAN) IV   PHYSICAL EXAM: Vital signs: Vitals:   02/23/20 0407 02/23/20 0602 02/23/20 0750 02/23/20 1128  BP: 95/63 98/72 100/60 103/66  Pulse: (!) 59 (!) 59 67 63  Resp: 18 18 18 16   Temp: 97.7 F (36.5 C) 97.7 F (36.5 C) 98.1 F (36.7 C) 98.8 F (37.1 C)  TempSrc: Oral Oral Oral Oral  SpO2: 97% 99% 98% 97%  Weight:      Height:       Filed Weights   02/18/20 1826  Weight: 54.4 kg   Body mass index is 19.97 kg/m.   Gen Exam:Alert awake-not in any distress HEENT:atraumatic, normocephalic Chest: B/L clear to auscultation anteriorly CVS:S1S2 regular Abdomen:soft non tender, non distended Extremities:no edema Neurology: Non focal Skin: no rash  I have personally reviewed following labs and imaging studies  LABORATORY DATA: CBC: Recent Labs  Lab 02/18/20 1910 02/19/20 0446 02/23/20 0217  WBC 3.9* 3.1* 3.2*  NEUTROABS 2.0  --   --   HGB 13.5 11.5* 11.4*  HCT 41.4 34.7* 35.9*  MCV 82.3 82.4 83.9  PLT 212 179 382    Basic Metabolic Panel: Recent Labs  Lab 02/18/20 1910 02/19/20 0446 02/23/20 0217  NA 135 138 141  K 3.5 3.7 3.8  CL 103 110 112*  CO2 26 24 23   GLUCOSE 67* 121* 93  BUN 12 12 15   CREATININE 1.00 0.94 0.90  CALCIUM  9.1 8.1* 8.7*    GFR: Estimated Creatinine Clearance: 67.8 mL/min (by C-G formula based on SCr of 0.9 mg/dL).  Liver Function Tests: Recent Labs  Lab 02/18/20 1910 02/19/20 0446 02/23/20 0217  AST 26 21 25   ALT 17 14 19   ALKPHOS 54 45 40  BILITOT 0.6 0.3 0.3  PROT 9.2* 7.4 6.9  ALBUMIN 4.2 3.2* 3.0*   No results for input(s): LIPASE, AMYLASE in the last 168 hours. No results for input(s): AMMONIA in the last 168 hours.  Coagulation Profile: Recent Labs  Lab 02/18/20 1910  INR 1.0    Cardiac Enzymes: No results for input(s): CKTOTAL, CKMB, CKMBINDEX, TROPONINI in the last 168 hours.  BNP (last 3 results) No results for input(s): PROBNP in the last 8760 hours.  Lipid Profile: No results for input(s): CHOL, HDL, LDLCALC, TRIG, CHOLHDL, LDLDIRECT in the last 72 hours.  Thyroid Function Tests: No results for input(s): TSH, T4TOTAL, FREET4, T3FREE, THYROIDAB in the last 72 hours.  Anemia Panel: No results for input(s): VITAMINB12, FOLATE,  FERRITIN, TIBC, IRON, RETICCTPCT in the last 72 hours.  Urine analysis: No results found for: COLORURINE, APPEARANCEUR, LABSPEC, PHURINE, GLUCOSEU, HGBUR, BILIRUBINUR, KETONESUR, PROTEINUR, UROBILINOGEN, NITRITE, LEUKOCYTESUR  Sepsis Labs: Lactic Acid, Venous No results found for: LATICACIDVEN  MICROBIOLOGY: Recent Results (from the past 240 hour(s))  Respiratory Panel by RT PCR (Flu A&B, Covid) - Nasopharyngeal Swab     Status: None   Collection Time: 02/18/20  7:33 PM   Specimen: Nasopharyngeal Swab  Result Value Ref Range Status   SARS Coronavirus 2 by RT PCR NEGATIVE NEGATIVE Final    Comment: (NOTE) SARS-CoV-2 target nucleic acids are NOT DETECTED.  The SARS-CoV-2 RNA is generally detectable in upper respiratoy specimens during the acute phase of infection. The lowest concentration of SARS-CoV-2 viral copies this assay can detect is 131 copies/mL. A negative result does not preclude SARS-Cov-2 infection and should not  be used as the sole basis for treatment or other patient management decisions. A negative result may occur with  improper specimen collection/handling, submission of specimen other than nasopharyngeal swab, presence of viral mutation(s) within the areas targeted by this assay, and inadequate number of viral copies (<131 copies/mL). A negative result must be combined with clinical observations, patient history, and epidemiological information. The expected result is Negative.  Fact Sheet for Patients:  PinkCheek.be  Fact Sheet for Healthcare Providers:  GravelBags.it  This test is no t yet approved or cleared by the [place] FDA and  has been authorized for detection and/or diagnosis of SARS-CoV-2 by FDA under an Emergency Use Authorization (EUA). This EUA will remain  in effect (meaning this test can be used) for the duration of the COVID-19 declaration under Section 564(b)(1) of the Act, 21 U.S.C. section 360bbb-3(b)(1), unless the authorization is terminated or revoked sooner.     Influenza A by PCR NEGATIVE NEGATIVE Final   Influenza B by PCR NEGATIVE NEGATIVE Final    Comment: (NOTE) The Xpert Xpress SARS-CoV-2/FLU/RSV assay is intended as an aid in  the diagnosis of influenza from Nasopharyngeal swab specimens and  should not be used as a sole basis for treatment. Nasal washings and  aspirates are unacceptable for Xpert Xpress SARS-CoV-2/FLU/RSV  testing.  Fact Sheet for Patients: PinkCheek.be  Fact Sheet for Healthcare Providers: GravelBags.it  This test is not yet approved or cleared by the [place] FDA and  has been authorized for detection and/or diagnosis of SARS-CoV-2 by  FDA under an Emergency Use Authorization (EUA). This EUA will remain  in effect (meaning this test can be used) for the duration of the  Covid-19 declaration under Section  564(b)(1) of the Act, 21  U.S.C. section 360bbb-3(b)(1), unless the authorization is  terminated or revoked. Performed at [facility], [address], [place], [place] [address]     RADIOLOGY STUDIES/RESULTS: No results found.   LOS: 5 days   Oren Binet, MD  [facility]    To contact the attending provider between 7A-7P or the covering provider during after hours 7P-7A, please log into the web site [email or web address] and access using universal [place] password for that web site. If you do not have the password, please call the hospital operator.  02/23/2020, 2:02 PM

## 2020-02-23 NOTE — Progress Notes (Signed)
PT Cancellation Note  Patient Details Name: April Acevedo MRN: [id number] DOB: [birth year]   Cancelled Treatment:    Reason Eval/Treat Not Completed: Patient at procedure or test/unavailable (Pt in radiology. Will return as able. )   [name] 02/23/2020, 9:26 AM  [name] W,PT Acute Rehabilitation Services Pager:  [phone]  Office:  [phone]

## 2020-02-23 NOTE — Care Management Important Message (Signed)
Important Message  Patient Details  Name: April Acevedo MRN: [id number] Date of Birth: 1975-06-12   Medicare Important Message Given:  Yes     Charlene Detter Montine Circle 02/23/2020, 2:51 PM

## 2020-02-23 NOTE — Progress Notes (Signed)
Pt has returned from CT.  

## 2020-02-23 NOTE — Progress Notes (Signed)
Pt went down for CT.

## 2020-02-23 NOTE — Progress Notes (Addendum)
Speech Language Pathology Treatment: Cognitive-Linquistic  Patient Details Name: Tineshia Becraft MRN: [id number] DOB: 07-14-1975 Today's Date: 02/23/2020 Time: 1400-1430 SLP Time Calculation (min) (ACUTE ONLY): 30 min  Assessment / Plan / Recommendation Clinical Impression  F/u after initial assessment. Pt's cognition is much better - she is alert and oriented.  Remarks that she stays very still to avoid triggering vertigo. Excellent historian.  Attention and short-term memory are WNL.  Language WNL. Smiling and interactive.  Her husband reports that she is much more like herself this afternoon, but when she has had a lot of meds she is drowsier.  W/u is still underway. No SLP f/u is warranted in this situation.  Pt agrees - our service will sign off.    HPI HPI: 44 y.o. female with hx of afib and breast cancer (currently still taking Aromasin and Lupron), presented to ED after syncopal episode and collapse at home. CT head and MRI negative for acute infarct.  W/u underway, EEG pending; continues with metabolic encephalopathy.  Pt and family moved here recently from [place], have not established PCP yet.        SLP Plan  All goals met       Recommendations   no SLP f/u                Follow up Recommendations: None SLP Visit Diagnosis: Cognitive communication deficit (W65.681) Plan: All goals met       GO              Mardel Grudzien L. Tivis Ringer, [place] CCC/SLP Acute Rehabilitation Services Office number [phone] Pager [phone]   Juan Quam Laurice 02/23/2020, 2:40 PM

## 2020-02-24 DIAGNOSIS — I4891 Unspecified atrial fibrillation: Secondary | ICD-10-CM | POA: Diagnosis not present

## 2020-02-24 DIAGNOSIS — R42 Dizziness and giddiness: Secondary | ICD-10-CM | POA: Diagnosis not present

## 2020-02-24 DIAGNOSIS — G4489 Other headache syndrome: Secondary | ICD-10-CM | POA: Diagnosis not present

## 2020-02-24 LAB — CBC
HCT: 39.5 % (ref 36.0–46.0)
Hemoglobin: 12.8 g/dL (ref 12.0–15.0)
MCH: 26.8 pg (ref 26.0–34.0)
MCHC: 32.4 g/dL (ref 30.0–36.0)
MCV: 82.8 fL (ref 80.0–100.0)
Platelets: 182 10*3/uL (ref 150–400)
RBC: 4.77 MIL/uL (ref 3.87–5.11)
RDW: 12.9 % (ref 11.5–15.5)
WBC: 4.9 10*3/uL (ref 4.0–10.5)
nRBC: 0 % (ref 0.0–0.2)

## 2020-02-24 LAB — BASIC METABOLIC PANEL
Anion gap: 10 (ref 5–15)
BUN: 13 mg/dL (ref 6–20)
CO2: 23 mmol/L (ref 22–32)
Calcium: 9.5 mg/dL (ref 8.9–10.3)
Chloride: 105 mmol/L (ref 98–111)
Creatinine, Ser: 0.91 mg/dL (ref 0.44–1.00)
GFR, Estimated: >60 mL/min (ref >60)
Glucose, Bld: 128 mg/dL — ABNORMAL HIGH (ref 70–99)
Potassium: 3.9 mmol/L (ref 3.5–5.1)
Sodium: 138 mmol/L (ref 135–145)

## 2020-02-24 MED ORDER — MECLIZINE HCL 12.5 MG PO TABS
25.0000 mg | ORAL_TABLET | Freq: Three times a day (TID) | ORAL | Status: DC | PRN
  Administered 2020-02-24: 25 mg via ORAL
  Filled 2020-02-24: qty 2

## 2020-02-24 MED ORDER — KETOROLAC TROMETHAMINE 30 MG/ML IJ SOLN
30.0000 mg | Freq: Three times a day (TID) | INTRAMUSCULAR | Status: AC | PRN
  Administered 2020-02-25 – 2020-02-26 (×3): 30 mg via INTRAVENOUS
  Filled 2020-02-24 (×3): qty 1

## 2020-02-24 MED ORDER — VALPROATE SODIUM 500 MG/5ML IV SOLN
500.0000 mg | Freq: Once | INTRAVENOUS | Status: AC
  Administered 2020-02-24: 500 mg via INTRAVENOUS
  Filled 2020-02-24: qty 5

## 2020-02-24 MED ORDER — ZOLPIDEM TARTRATE 5 MG PO TABS
5.0000 mg | ORAL_TABLET | Freq: Every evening | ORAL | Status: DC | PRN
  Administered 2020-02-24 – 2020-02-25 (×2): 5 mg via ORAL
  Filled 2020-02-24 (×2): qty 1

## 2020-02-24 NOTE — Progress Notes (Signed)
Physical Therapy Treatment Patient Details Name: April Acevedo MRN: [id number] DOB: May 14, 1975 Today's Date: 02/24/2020    History of Present Illness  April Acevedo  is a 44 y.o. female, with history of atrial fibrillation and breast cancer -currently still taking Aromasin and Lupron, presents to the ED with a chief complaint of syncope.Etiology of syncope is uncelar.  Some concern for seizure, HA.  Multiple head scans clear including MRI CT venogram, CT and CTA.      PT Comments    We are continuing to build her tool box of compensatory strategies and exercises to help suppress the vertigo sensation.  She was better able to implement targeting and segmental turning into her mobility OOB to the chair.  I anticipate she will be able to continue progress tomorrow.  X1 seated vertical and horizontal exercises demonstrated and handout give. We will need to see her demonstrate them correctly tomorrow.  PT will continue to follow acutely for safe mobility progression   Follow Up Recommendations  Home health PT (please request a vestibular therapist)     Equipment Recommendations  Rolling walker with 5" wheels;3in1 (PT)    Recommendations for Other Services       Precautions / Restrictions Precautions Precautions: Fall    Mobility  Bed Mobility Overal bed mobility: Needs Assistance   Rolling: Modified independent (Device/Increase time) Sidelying to sit: Modified independent (Device/Increase time)       General bed mobility comments: Relies on railing and extra time.   Transfers Overall transfer level: Needs assistance Equipment used: Rolling walker (2 wheeled) Transfers: Sit to/from [facility] Sit to Stand: Min assist Stand pivot transfers: Min guard       General transfer comment: Min assist to come to standing and stabilize, min guard assist to turn and step to the recliner chair.  Compensatory techniques of targeting and segmental turning  reinforced and pt reported feeling better with movement while practicing them.   Ambulation/Gait                 Stairs             Wheelchair Mobility    Modified Rankin (Stroke Patients Only)       Balance Overall balance assessment: Needs assistance Sitting-balance support: Feet supported;Bilateral upper extremity supported Sitting balance-Leahy Scale: Fair     Standing balance support: Bilateral upper extremity supported Standing balance-Leahy Scale: Poor                              Cognition Arousal/Alertness: Awake/alert Behavior During Therapy: WFL for tasks assessed/performed (better affect today) Overall Cognitive Status: Within Functional Limits for tasks assessed                                 General Comments: I would say appropriate given the intensity of her HAs      Exercises Other Exercises Other Exercises: x1 seated vertical and horizontal exercises as well as scanning to target exercises encouraged and demonstrated to pt and her husband.     General Comments General comments (skin integrity, edema, etc.): I spoke at length with the patient re the stressors in her life, her taking on too much and not taking care of herself, over stressing, etc.  I encouraged her to ask for a break and to start to be ok with saying no to people. She reports  she has had therapy for her anxiety in the past and did not find it permanently helpful.       Pertinent Vitals/Pain Pain Assessment: 0-10 Pain Score: 7  Pain Location: headache Pain Descriptors / Indicators: Aching;Guarding;Grimacing Pain Intervention(s): Limited activity within patient's tolerance;Monitored during session;Repositioned    Home Living                      Prior Function            PT Goals (current goals can now be found in the care plan section) Acute Rehab PT Goals Patient Stated Goal: to stop feeling dizzy Progress towards PT goals:  Progressing toward goals    Frequency    Min 4X/week      PT Plan Current plan remains appropriate    Co-evaluation              AM-PAC PT "6 Clicks" Mobility   Outcome Measure  Help needed turning from your back to your side while in a flat bed without using bedrails?: A Little Help needed moving from lying on your back to sitting on the side of a flat bed without using bedrails?: A Little Help needed moving to and from a bed to a chair (including a wheelchair)?: A Little Help needed standing up from a chair using your arms (e.g., wheelchair or bedside chair)?: A Little Help needed to walk in hospital room?: A Little Help needed climbing 3-5 steps with a railing? : A Lot 6 Click Score: 17    End of Session Equipment Utilized During Treatment: Gait belt Activity Tolerance: Patient limited by fatigue;Patient limited by pain Patient left: in chair;with call bell/phone within reach;with family/visitor present   PT Visit Diagnosis: Other abnormalities of gait and mobility (R26.89);Other symptoms and signs involving the nervous system (R29.898);Pain;Dizziness and giddiness (R42) Pain - part of body:  (head)     Time: 1715-1820 (did not charge for extra time spent speaking with her) PT Time Calculation (min) (ACUTE ONLY): 65 min  Charges:  $Therapeutic Exercise: 8-22 mins $Therapeutic Activity: 8-22 mins                     Verdene Lennert, PT, DPT  Acute Rehabilitation [phone] pager [phone]) [phone] office

## 2020-02-24 NOTE — Progress Notes (Signed)
Occupational Therapy Treatment Patient Details Name: April Acevedo MRN: [id number] DOB: 1975-12-17 Today's Date: 02/24/2020    History of present illness  April Acevedo  is a 44 y.o. female, with history of atrial fibrillation and breast cancer -currently still taking Aromasin and Lupron, presents to the ED with a chief complaint of syncope.Etiology of syncope is uncelar.  Some concern for seizure, HA.  Multiple head scans clear including MRI CT venogram, CT and CTA.     OT comments  Pt progressed from bed level to sink level grooming task with increased time. Pt able to complete bed mobility and static eob sitting without physical (A) this session showing progression from previous sessions. Pt encouraged by session and hopeful for return to home over weekend. Pt continues to have poor activity tolerance. Recommend chaplain visit acutely due to stressors discussed this session.   Follow Up Recommendations  Outpatient OT    Equipment Recommendations  Tub/shower seat    Recommendations for Other Services      Precautions / Restrictions Precautions Precautions: Fall       Mobility Bed Mobility Overal bed mobility: Modified Independent             General bed mobility comments: increased time and sitting eob prior to next transition to standing   Transfers Overall transfer level: Needs assistance Equipment used: Rolling walker (2 wheeled) Transfers: Sit to/from Stand Sit to Stand: Min guard              Balance Overall balance assessment: Needs assistance   Sitting balance-Leahy Scale: Fair       Standing balance-Leahy Scale: Fair                             ADL either performed or assessed with clinical judgement   ADL Overall ADL's : Needs assistance/impaired     Grooming: Min guard Grooming Details (indicate cue type and reason): pt was able to progress to sink level task with use of gaze stabilization                  Toilet Transfer: Min Psychiatric nurse Details (indicate cue type and reason): simulated oob transfer to sink and back         Functional mobility during ADLs: Min guard General ADL Comments: pt able to progress from bed to sink level with distraction talking to patient about grandchildren. pt talking and distracted helping her gaze stabilize reaching sink level      Vision       Perception     Praxis      Cognition Arousal/Alertness: Awake/alert Behavior During Therapy: WFL for tasks assessed/performed Overall Cognitive Status: Within Functional Limits for tasks assessed                                          Exercises Other Exercises Other Exercises: pt noted to have increased eye movements this session and tracking therapist that has not occurred in previous session.  Other Exercises: pt with good carry over of gaze stabilization.  Other Exercises: pt with increased neck rotation this session   Shoulder Instructions       General Comments spoke with patient and husband about stress in her life concerning her niece whom she has raised as her own daughter being custodial kiddnapped by the mother and not  seeing her in one year. pt speaks of the child with concern this session. pt encouraged to focus on getting better and OT showing empathy for such a stressful event in her life. pt coudl benefit from pastor visiting her acutely    Pertinent Vitals/ Pain       Pain Assessment: 0-10 Pain Score: 5  Pain Location: headache Pain Descriptors / Indicators: Discomfort Pain Intervention(s): Monitored during session;Premedicated before session;Repositioned  Home Living                                          Prior Functioning/Environment              Frequency  Min 2X/week        Progress Toward Goals  OT Goals(current goals can now be found in the care plan section)  Progress towards OT goals: Progressing toward  goals  Acute Rehab OT Goals Patient Stated Goal: to get some corn bread OT Goal Formulation: With patient/family Time For Goal Achievement: 03/05/20 Potential to Achieve Goals: Good ADL Goals Pt Will Perform Grooming: with supervision;sitting Pt Will Transfer to Toilet: with min assist;bedside commode;stand pivot transfer Additional ADL Goal #1: pt will complete sink level grooming task Min guard (A)  Plan      Co-evaluation                 AM-PAC OT "6 Clicks" Daily Activity     Outcome Measure   Help from another person eating meals?: A Little Help from another person taking care of personal grooming?: A Little Help from another person toileting, which includes using toliet, bedpan, or urinal?: A Lot Help from another person bathing (including washing, rinsing, drying)?: A Lot Help from another person to put on and taking off regular upper body clothing?: A Little Help from another person to put on and taking off regular lower body clothing?: A Lot 6 Click Score: 15    End of Session    OT Visit Diagnosis: Unsteadiness on feet (R26.81);Muscle weakness (generalized) (M62.81)   Activity Tolerance Patient tolerated treatment well   Patient Left in bed;with call bell/phone within reach;with family/visitor present   Nurse Communication Mobility status;Precautions        Time: 4010-2725 OT Time Calculation (min): 28 min  Charges: OT General Charges $OT Visit: 1 Visit OT Treatments $Self Care/Home Management : 8-22 mins   Brynn, OTR/L  Acute Rehabilitation Services Pager: [phone] Office: [phone] .    Jeri Modena 02/24/2020, 11:15 PM

## 2020-02-24 NOTE — Plan of Care (Signed)
Pt alert and oriented. Pt is on RA. Tele in place. CT done. Family at bedside for support.   Problem: Education: Goal: Knowledge of General Education information will improve Description: Including pain rating scale, medication(s)/side effects and non-pharmacologic comfort measures Outcome: Progressing   Problem: Health Behavior/Discharge Planning: Goal: Ability to manage health-related needs will improve Outcome: Progressing   Problem: Clinical Measurements: Goal: Ability to maintain clinical measurements within normal limits will improve Outcome: Progressing Goal: Will remain free from infection Outcome: Progressing Goal: Diagnostic test results will improve Outcome: Progressing Goal: Respiratory complications will improve Outcome: Progressing Goal: Cardiovascular complication will be avoided Outcome: Progressing   Problem: Activity: Goal: Risk for activity intolerance will decrease Outcome: Progressing   Problem: Nutrition: Goal: Adequate nutrition will be maintained Outcome: Progressing   Problem: Coping: Goal: Level of anxiety will decrease Outcome: Progressing   Problem: Elimination: Goal: Will not experience complications related to bowel motility Outcome: Progressing Goal: Will not experience complications related to urinary retention Outcome: Progressing   Problem: Pain Managment: Goal: General experience of comfort will improve Outcome: Progressing   Problem: Safety: Goal: Ability to remain free from injury will improve Outcome: Progressing   Problem: Skin Integrity: Goal: Risk for impaired skin integrity will decrease Outcome: Progressing

## 2020-02-24 NOTE — Progress Notes (Signed)
PROGRESS NOTE        PATIENT DETAILS Name: April Acevedo Age: 44 y.o. Sex: female Date of Birth: 1975-08-01 Admit Date: 02/18/2020 Admitting Physician Rolla Plate, DO DPO:EUMPNTI, No Pcp Per  Brief Narrative: Patient is a 44 y.o. female PAF-not on anticoagulation, breast cancer-presented to the hospital following a syncopal episode-further hospital course complicated by peripheral vertigo and frontal headaches.  See below for further details.  Significant events: 10/16>> admit to [facility] for evaluation of syncope  Significant studies: 10/16>> CT head: No acute abnormalities 10/16>> CTA head and neck: No large vessel occlusion or stenosis or aneurysm in the head and neck. 10/17>> MRI brain: No metastatic disease acute intracranial abnormality 10/17>> Echo: EF 55-60% 10/19>> X-ray abdomen: No acute abnormality 10/21>> CT venogram head: Negative for dural venous sinus thrombosis  Antimicrobial therapy: Augmentin: 10/19>>  Microbiology data: None  Procedures : 10/18>> EEG: No seizure activity.  Consults: Neurology  DVT Prophylaxis : heparin injection 5,000 Units Start: 02/18/20 2345 SCD's Start: 02/18/20 2344  Subjective: She looks better-more comfortable compared to the past few days-slightly less headache and dizziness compared to the past few days.  Assessment/Plan: Syncope: Etiology unclear-some concern for seizures-evaluated by neurology-advised to hold off on AEDs unless patient has recurrent symptoms.  Telemetry negative for arrhythmia-echo with preserved EF.  Per neurology-no driving for 6 months with full seizure precautions.  Vertigo: Vertigo appears positional-MRI brain without any acute abnormalities.  CT venogram brain with no evidence of dural venous sinus thrombosis.  Continue as needed meclizine-continued attempts at physical therapy-seems to be slowly improving-continue supportive care.  Reassess tomorrow.   Headache:  Continues to have frontal headaches-7/10-has no prior history of migraine headaches- headache has some migrainous features (likes room to be quiet/dark-endorses photophobia)-responded minimally to cocktail of Toradol/Reglan/Benadryl for only 2 hours.  No response to Decadron-we will try Depacon today.  Today does acknowledge taking 800 mg of ibuprofen daily for rheumatoid arthritis.  May need steroid taper at some point.  CT venogram head negative for dural venous sinus thrombosis-if headache persists may need neurology eval-reassess tomorrow.  Empirically on Augmentin-given mastoid effusion seen on imaging studies.  PAF: Remains in sinus rhythm-Per prior documentation-not on anticoagulation due to interaction with her cancer medications.  History of breast cancer-s/p double mastectomy 4 years back in [place]: Maintained on Aromasin and Lupron-we will need to establish with outpatient oncology as patient just moved into this area.  Apparently has not taken Lupron in the past several months.  3 mm left upper lung nodule: Stable for outpatient follow-up  2.7 cm left thyroid nodule: Stable for outpatient follow-up-TSH within normal limits.  Abnormal appearance of salivary gland and imaging studies: No clinical features consistent with Sjogren's symptoms-doubt further work-up required while inpatient   Diet: Diet Order            Diet regular Room service appropriate? Yes; Fluid consistency: Thin  Diet effective now                  Code Status: Full code   Family Communication: Spouse at bedside  Disposition Plan: Status is: Inpatient  Remains inpatient appropriate because:Inpatient level of care appropriate due to severity of illness   Dispo: The patient is from: Home              Anticipated d/c is to: Home  Anticipated d/c date is: 2 days              Patient currently is not medically stable to d/c.  Barriers to Discharge: Ongoing severe vertigo-headache-needing  inpatient therapies-see above.  Antimicrobial agents: Anti-infectives (From admission, onward)   Start     Dose/Rate Route Frequency Ordered Stop   02/21/20 1345  amoxicillin-clavulanate (AUGMENTIN) 875-125 MG per tablet 1 tablet        1 tablet Oral Every 12 hours 02/21/20 1258         Time spent: 25- minutes-Greater than 50% of this time was spent in counseling, explanation of diagnosis, planning of further management, and coordination of care.  MEDICATIONS: Scheduled Meds: . amoxicillin-clavulanate  1 tablet Oral Q12H  . busPIRone  7.5 mg Oral Daily  . exemestane  25 mg Oral QPC breakfast  . heparin  5,000 Units Subcutaneous Q8H  . polyethylene glycol  17 g Oral BID  . propranolol  40 mg Oral Daily  . senna  1 tablet Oral QHS  . traZODone  100 mg Oral QHS   Continuous Infusions: PRN Meds:.acetaminophen **OR** acetaminophen (TYLENOL) oral liquid 160 mg/5 mL **OR** acetaminophen, bisacodyl, ketorolac, magic mouthwash, meclizine, morphine injection, ondansetron (ZOFRAN) IV, zolpidem   PHYSICAL EXAM: Vital signs: Vitals:   02/23/20 2314 02/24/20 0345 02/24/20 0812 02/24/20 1140  BP: 105/66 96/66 98/62  (!) 90/50  Pulse: 66 (!) 54 (!) 53 (!) 55  Resp: 16 16 16 16   Temp: 98 F (36.7 C) 97.7 F (36.5 C) 97.6 F (36.4 C) 98 F (36.7 C)  TempSrc: Oral Oral Oral Oral  SpO2: 98% 97% 97% 98%  Weight:      Height:       Filed Weights   02/18/20 1826  Weight: 54.4 kg   Body mass index is 19.97 kg/m.   Gen Exam:Alert awake-not in any distress HEENT:atraumatic, normocephalic Chest: B/L clear to auscultation anteriorly CVS:S1S2 regular Abdomen:soft non tender, non distended Extremities:no edema Neurology: Non focal Skin: no rash  I have personally reviewed following labs and imaging studies  LABORATORY DATA: CBC: Recent Labs  Lab 02/18/20 1910 02/19/20 0446 02/23/20 0217 02/24/20 0138  WBC 3.9* 3.1* 3.2* 4.9  NEUTROABS 2.0  --   --   --   HGB 13.5 11.5*  11.4* 12.8  HCT 41.4 34.7* 35.9* 39.5  MCV 82.3 82.4 83.9 82.8  PLT 212 179 151 409    Basic Metabolic Panel: Recent Labs  Lab 02/18/20 1910 02/19/20 0446 02/23/20 0217 02/24/20 0138  NA 135 138 141 138  K 3.5 3.7 3.8 3.9  CL 103 110 112* 105  CO2 26 24 23 23   GLUCOSE 67* 121* 93 128*  BUN 12 12 15 13   CREATININE 1.00 0.94 0.90 0.91  CALCIUM 9.1 8.1* 8.7* 9.5    GFR: Estimated Creatinine Clearance: 67 mL/min (by C-G formula based on SCr of 0.91 mg/dL).  Liver Function Tests: Recent Labs  Lab 02/18/20 1910 02/19/20 0446 02/23/20 0217  AST 26 21 25   ALT 17 14 19   ALKPHOS 54 45 40  BILITOT 0.6 0.3 0.3  PROT 9.2* 7.4 6.9  ALBUMIN 4.2 3.2* 3.0*   No results for input(s): LIPASE, AMYLASE in the last 168 hours. No results for input(s): AMMONIA in the last 168 hours.  Coagulation Profile: Recent Labs  Lab 02/18/20 1910  INR 1.0    Cardiac Enzymes: No results for input(s): CKTOTAL, CKMB, CKMBINDEX, TROPONINI in the last 168 hours.  BNP (last 3 results)  No results for input(s): PROBNP in the last 8760 hours.  Lipid Profile: No results for input(s): CHOL, HDL, LDLCALC, TRIG, CHOLHDL, LDLDIRECT in the last 72 hours.  Thyroid Function Tests: No results for input(s): TSH, T4TOTAL, FREET4, T3FREE, THYROIDAB in the last 72 hours.  Anemia Panel: No results for input(s): VITAMINB12, FOLATE, FERRITIN, TIBC, IRON, RETICCTPCT in the last 72 hours.  Urine analysis: No results found for: COLORURINE, APPEARANCEUR, LABSPEC, PHURINE, GLUCOSEU, HGBUR, BILIRUBINUR, KETONESUR, PROTEINUR, UROBILINOGEN, NITRITE, LEUKOCYTESUR  Sepsis Labs: Lactic Acid, Venous No results found for: LATICACIDVEN  MICROBIOLOGY: Recent Results (from the past 240 hour(s))  Respiratory Panel by RT PCR (Flu A&B, Covid) - Nasopharyngeal Swab     Status: None   Collection Time: 02/18/20  7:33 PM   Specimen: Nasopharyngeal Swab  Result Value Ref Range Status   SARS Coronavirus 2 by RT PCR NEGATIVE  NEGATIVE Final    Comment: (NOTE) SARS-CoV-2 target nucleic acids are NOT DETECTED.  The SARS-CoV-2 RNA is generally detectable in upper respiratoy specimens during the acute phase of infection. The lowest concentration of SARS-CoV-2 viral copies this assay can detect is 131 copies/mL. A negative result does not preclude SARS-Cov-2 infection and should not be used as the sole basis for treatment or other patient management decisions. A negative result may occur with  improper specimen collection/handling, submission of specimen other than nasopharyngeal swab, presence of viral mutation(s) within the areas targeted by this assay, and inadequate number of viral copies (<131 copies/mL). A negative result must be combined with clinical observations, patient history, and epidemiological information. The expected result is Negative.  Fact Sheet for Patients:  PinkCheek.be  Fact Sheet for Healthcare Providers:  GravelBags.it  This test is no t yet approved or cleared by the [place] FDA and  has been authorized for detection and/or diagnosis of SARS-CoV-2 by FDA under an Emergency Use Authorization (EUA). This EUA will remain  in effect (meaning this test can be used) for the duration of the COVID-19 declaration under Section 564(b)(1) of the Act, 21 U.S.C. section 360bbb-3(b)(1), unless the authorization is terminated or revoked sooner.     Influenza A by PCR NEGATIVE NEGATIVE Final   Influenza B by PCR NEGATIVE NEGATIVE Final    Comment: (NOTE) The Xpert Xpress SARS-CoV-2/FLU/RSV assay is intended as an aid in  the diagnosis of influenza from Nasopharyngeal swab specimens and  should not be used as a sole basis for treatment. Nasal washings and  aspirates are unacceptable for Xpert Xpress SARS-CoV-2/FLU/RSV  testing.  Fact Sheet for Patients: PinkCheek.be  Fact Sheet for Healthcare  Providers: GravelBags.it  This test is not yet approved or cleared by the [place] FDA and  has been authorized for detection and/or diagnosis of SARS-CoV-2 by  FDA under an Emergency Use Authorization (EUA). This EUA will remain  in effect (meaning this test can be used) for the duration of the  Covid-19 declaration under Section 564(b)(1) of the Act, 21  U.S.C. section 360bbb-3(b)(1), unless the authorization is  terminated or revoked. Performed at [facility], [address] [place], [place] [address]     RADIOLOGY STUDIES/RESULTS: CT VENOGRAM HEAD  Result Date: 02/23/2020 CLINICAL DATA:  44 year old female code stroke presentation on 02/18/2020. Persistent vertigo.  Query dural venous sinus thrombosis. EXAM: CT VENOGRAM HEAD TECHNIQUE: Multi detector CTA of the head for angiographic evaluation of the venous sinuses with multiplanar reformatted images CONTRAST:  55mL OMNIPAQUE IOHEXOL 350 MG/ML SOLN COMPARISON:  Brain MRI without and with contrast 02/19/2020,  head CT CTA head and neck 02/18/2020. FINDINGS: Brain: Stable non contrast CT appearance of the brain. Faint dystrophic calcifications at the bilateral basal ganglia. Pearline Cables and white matter signal remains within normal limits. No intracranial mass, acute intracranial hemorrhage or evidence of cortically based infarct. Calvarium and skull base: No acute osseous abnormality identified. Paranasal sinuses: Tympanic cavities appear to remain clear. There is a left mastoid effusion again noted. Right mastoids are clear. Visible paranasal sinuses remain clear. Orbits: Visualized orbits and scalp soft tissues are within normal limits. In addition to the granular appearance of the parotids on MRI there are numerous punctate bilateral sialoliths noted on CT (series 4, image 1). Venous sinuses: Preserved enhancement of the superior sagittal sinus, with multiple normal arachnoid granulations suspected at the vertex  (such as on series 10, image 94 and see recent MRI series 17, image 10). Preserved enhancement of the torcula, bilateral transverse sinuses, bilateral sigmoid sinuses, bilateral IJ bulbs. Preserved enhancement of the straight sinus, vein of Galen, internal cerebral veins and basal veins of Rosenthal. The cavernous sinus appears patent and enhancing. The major intracranial arterial structures also appear to be enhancing as expected. The major cortical draining veins also appear to be enhancing. Anatomic variants: Slightly dominant right transverse and sigmoid sinus. Review of the MIP images confirms the above findings IMPRESSION: 1. Negative for dural venous sinus thrombosis. The major dural venous structures also appear normal on recent CTA, MRI. 2. Stable and negative CT appearance of the brain. 3. Left mastoid effusion again noted. Most commonly these are postinflammatory and not significant, but follow-up with ENT may be valuable considering persistent vertigo. 4. 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 Electronically Signed   By: Genevie Ann M.D.   On: 02/23/2020 20:48     LOS: 6 days   Oren Binet, MD  [facility]    To contact the attending provider between 7A-7P or the covering provider during after hours 7P-7A, please log into the web site [email or web address] and access using universal [place] password for that web site. If you do not have the password, please call the hospital operator.  02/24/2020, 12:04 PM

## 2020-02-25 DIAGNOSIS — G4489 Other headache syndrome: Secondary | ICD-10-CM | POA: Diagnosis not present

## 2020-02-25 DIAGNOSIS — I4891 Unspecified atrial fibrillation: Secondary | ICD-10-CM | POA: Diagnosis not present

## 2020-02-25 DIAGNOSIS — R42 Dizziness and giddiness: Secondary | ICD-10-CM | POA: Diagnosis not present

## 2020-02-25 LAB — CBC
HCT: 38.9 % (ref 36.0–46.0)
Hemoglobin: 12.6 g/dL (ref 12.0–15.0)
MCH: 26.6 pg (ref 26.0–34.0)
MCHC: 32.4 g/dL (ref 30.0–36.0)
MCV: 82.1 fL (ref 80.0–100.0)
Platelets: 173 10*3/uL (ref 150–400)
RBC: 4.74 MIL/uL (ref 3.87–5.11)
RDW: 13.2 % (ref 11.5–15.5)
WBC: 5 10*3/uL (ref 4.0–10.5)
nRBC: 0 % (ref 0.0–0.2)

## 2020-02-25 LAB — COMPREHENSIVE METABOLIC PANEL
ALT: 35 U/L (ref 0–44)
AST: 40 U/L (ref 15–41)
Albumin: 3.3 g/dL — ABNORMAL LOW (ref 3.5–5.0)
Alkaline Phosphatase: 41 U/L (ref 38–126)
Anion gap: 8 (ref 5–15)
BUN: 17 mg/dL (ref 6–20)
CO2: 25 mmol/L (ref 22–32)
Calcium: 9 mg/dL (ref 8.9–10.3)
Chloride: 106 mmol/L (ref 98–111)
Creatinine, Ser: 0.93 mg/dL (ref 0.44–1.00)
GFR, Estimated: >60 mL/min (ref >60)
Glucose, Bld: 105 mg/dL — ABNORMAL HIGH (ref 70–99)
Potassium: 3.7 mmol/L (ref 3.5–5.1)
Sodium: 139 mmol/L (ref 135–145)
Total Bilirubin: 0.4 mg/dL (ref 0.3–1.2)
Total Protein: 7.7 g/dL (ref 6.5–8.1)

## 2020-02-25 MED ORDER — SODIUM CHLORIDE 0.9 % IV BOLUS
250.0000 mL | Freq: Once | INTRAVENOUS | Status: AC
  Administered 2020-02-25: 250 mL via INTRAVENOUS

## 2020-02-25 MED ORDER — PROPRANOLOL HCL 20 MG PO TABS: 20.0000 mg | ORAL_TABLET | Freq: Every day | ORAL | Status: DC

## 2020-02-25 MED ORDER — TOPIRAMATE 25 MG PO TABS
25.0000 mg | ORAL_TABLET | Freq: Every day | ORAL | Status: DC
  Administered 2020-02-25: 25 mg via ORAL
  Filled 2020-02-25 (×2): qty 1

## 2020-02-25 MED ORDER — SODIUM CHLORIDE 0.9 % IV SOLN
500.0000 mg | Freq: Once | INTRAVENOUS | Status: AC
  Administered 2020-02-25: 500 mg via INTRAVENOUS
  Filled 2020-02-25: qty 4

## 2020-02-25 MED ORDER — VALPROATE SODIUM 500 MG/5ML IV SOLN
500.0000 mg | Freq: Once | INTRAVENOUS | Status: AC
  Administered 2020-02-25: 500 mg via INTRAVENOUS
  Filled 2020-02-25: qty 5

## 2020-02-25 MED ORDER — PROPRANOLOL HCL 20 MG PO TABS
20.0000 mg | ORAL_TABLET | Freq: Every day | ORAL | Status: DC
  Administered 2020-02-26: 20 mg via ORAL
  Filled 2020-02-25 (×2): qty 1

## 2020-02-25 NOTE — Progress Notes (Signed)
PROGRESS NOTE        PATIENT DETAILS Name: April Acevedo Age: 44 y.o. Sex: female Date of Birth: Apr 19, 1976 Admit Date: 02/18/2020 Admitting Physician Rolla Plate, DO NGE:[id number], No Pcp Per  Brief Narrative: Patient is a 44 y.o. female PAF-not on anticoagulation, breast cancer-presented to the hospital following a syncopal episode-further hospital course complicated by peripheral vertigo and frontal headaches.  See below for further details.  Significant events: 10/16>> admit to [facility] for evaluation of syncope  Significant studies: 10/16>> CT head: No acute abnormalities 10/16>> CTA head and neck: No large vessel occlusion or stenosis or aneurysm in the head and neck. 10/17>> MRI brain: No metastatic disease acute intracranial abnormality 10/17>> Echo: EF 55-60% 10/19>> X-ray abdomen: No acute abnormality 10/21>> CT venogram head: Negative for dural venous sinus thrombosis  Antimicrobial therapy: Augmentin: 10/19>>  Microbiology data: None  Procedures : 10/18>> EEG: No seizure activity.  Consults: Neurology  DVT Prophylaxis : heparin injection 5,000 Units Start: 02/18/20 2345 SCD's Start: 02/18/20 2344  Subjective: Slowly improving-vertigo and headaches getting better.  She appears more comfortable today compared to the past few days.  Assessment/Plan: Syncope: Etiology unclear-some concern for seizures-evaluated by neurology-advised to hold off on AEDs unless patient has recurrent symptoms.  Telemetry negative for arrhythmia-echo with preserved EF.  Per neurology-no driving for 6 months with full seizure precautions.  Vertigo: Vertigo appears positional-MRI brain without any acute abnormalities.  CT venogram brain with no evidence of dural venous sinus thrombosis.  Continue as needed meclizine-continued attempts at physical therapy-seems to be slowly improving-continue supportive care.  Will need outpatient vestibular  therapy.  Headache: Continues to have frontal headaches-however some improvement after IV Depacon on 10/22-headaches down to 3-5/10.  Patient has no prior history of migraine headaches-but headache has some migrainous features (likes room to be quiet/dark-endorses photophobia)-on almost daily dosing of ibuprofen for arthritis for the past several months.  Suspect this may be headache related to medication overuse.  Hardly any response to a cocktail of Toradol/Reglan/Benadryl-no major response to IV Decadron 10 mg.  Spoke with neurology-Dr. Arora-recommends repeat Depacon 500 mg IV today, and Solu-Medrol 500 mg IV today-and to place on Topamax 25 mg nightly for maintenance.  Empirically on Augmentin-given mastoid effusion seen on imaging studies-last day on 10/24.  CT venogram brain negative for dural venous sinus thrombosis-MRI brain negative for structural abnormalities.    PAF: Remains in sinus rhythm-Per prior documentation-not on anticoagulation due to interaction with her cancer medications.  Continue propranolol-BP soft (orthostatics negative) today-hence dose decreased to 20 mg.  History of breast cancer-s/p double mastectomy 4 years back in [place]: Maintained on Aromasin and Lupron-we will need to establish with outpatient oncology as patient just moved into this area.  Apparently has not taken Lupron in the past several months.  [facility]  Insomnia: Continue trazodone-added as needed Ambien-claims she finally slept well last night.  3 mm left upper lung nodule: Stable for outpatient follow-up  2.7 cm left thyroid nodule: Stable for outpatient follow-up-TSH within normal limits.  Abnormal appearance of salivary gland and imaging studies: No clinical features consistent with Sjogren's symptoms-doubt further work-up required while inpatient   Diet: Diet Order            Diet regular Room service appropriate? Yes; Fluid consistency: Thin  Diet effective now  Code Status: Full code   Family Communication: Spouse at bedside  Disposition Plan: Status is: Inpatient  Remains inpatient appropriate because:Inpatient level of care appropriate due to severity of illness   Dispo: The patient is from: Home              Anticipated d/c is to: Home              Anticipated d/c date is: 2 days              Patient currently is not medically stable to d/c.  Barriers to Discharge: Ongoing severe vertigo-headache-needing inpatient therapies-see above.  Antimicrobial agents: Anti-infectives (From admission, onward)   Start     Dose/Rate Route Frequency Ordered Stop   02/21/20 1345  amoxicillin-clavulanate (AUGMENTIN) 875-125 MG per tablet 1 tablet        1 tablet Oral Every 12 hours 02/21/20 1258 02/26/20 0959       Time spent: 25- minutes-Greater than 50% of this time was spent in counseling, explanation of diagnosis, planning of further management, and coordination of care.  MEDICATIONS: Scheduled Meds: . amoxicillin-clavulanate  1 tablet Oral Q12H  . busPIRone  7.5 mg Oral Daily  . exemestane  25 mg Oral QPC breakfast  . heparin  5,000 Units Subcutaneous Q8H  . polyethylene glycol  17 g Oral BID  . [START ON 02/26/2020] propranolol  20 mg Oral Daily  . senna  1 tablet Oral QHS  . topiramate  25 mg Oral QHS  . traZODone  100 mg Oral QHS   Continuous Infusions: PRN Meds:.acetaminophen **OR** acetaminophen (TYLENOL) oral liquid 160 mg/5 mL **OR** acetaminophen, bisacodyl, ketorolac, magic mouthwash, meclizine, ondansetron (ZOFRAN) IV, zolpidem   PHYSICAL EXAM: Vital signs: Vitals:   02/25/20 0016 02/25/20 0442 02/25/20 0813 02/25/20 1138  BP: (!) 98/59 (!) 98/59 (!) 89/52 93/63  Pulse: (!) 59 (!) 57 (!) 50 (!) 52  Resp: 18 18 17 17   Temp: 98.1 F (36.7 C) 97.7 F (36.5 C) 97.7 F (36.5 C) (!) 97.5 F (36.4 C)  TempSrc: Oral Oral Oral Oral  SpO2: 98% 99% 98% 99%  Weight:      Height:       Filed Weights    02/18/20 1826  Weight: 54.4 kg   Body mass index is 19.97 kg/m.   Gen Exam:Alert awake-not in any distress HEENT:atraumatic, normocephalic Chest: B/L clear to auscultation anteriorly CVS:S1S2 regular Abdomen:soft non tender, non distended Extremities:no edema Neurology: Non focal Skin: no rash  I have personally reviewed following labs and imaging studies  LABORATORY DATA: CBC: Recent Labs  Lab 02/18/20 1910 02/19/20 0446 02/23/20 0217 02/24/20 0138 02/25/20 0201  WBC 3.9* 3.1* 3.2* 4.9 5.0  NEUTROABS 2.0  --   --   --   --   HGB 13.5 11.5* 11.4* 12.8 12.6  HCT 41.4 34.7* 35.9* 39.5 38.9  MCV 82.3 82.4 83.9 82.8 82.1  PLT 212 179 151 182 854    Basic Metabolic Panel: Recent Labs  Lab 02/18/20 1910 02/19/20 0446 02/23/20 0217 02/24/20 0138 02/25/20 0201  NA 135 138 141 138 139  K 3.5 3.7 3.8 3.9 3.7  CL 103 110 112* 105 106  CO2 26 24 23 23 25   GLUCOSE 67* 121* 93 128* 105*  BUN 12 12 15 13 17   CREATININE 1.00 0.94 0.90 0.91 0.93  CALCIUM 9.1 8.1* 8.7* 9.5 9.0    GFR: Estimated Creatinine Clearance: 65.6 mL/min (by C-G formula based on SCr of 0.93 mg/dL).  Liver Function Tests: Recent Labs  Lab 02/18/20 1910 02/19/20 0446 02/23/20 0217 02/25/20 0201  AST 26 21 25  40  ALT 17 14 19  35  ALKPHOS 54 45 40 41  BILITOT 0.6 0.3 0.3 0.4  PROT 9.2* 7.4 6.9 7.7  ALBUMIN 4.2 3.2* 3.0* 3.3*   No results for input(s): LIPASE, AMYLASE in the last 168 hours. No results for input(s): AMMONIA in the last 168 hours.  Coagulation Profile: Recent Labs  Lab 02/18/20 1910  INR 1.0    Cardiac Enzymes: No results for input(s): CKTOTAL, CKMB, CKMBINDEX, TROPONINI in the last 168 hours.  BNP (last 3 results) No results for input(s): PROBNP in the last 8760 hours.  Lipid Profile: No results for input(s): CHOL, HDL, LDLCALC, TRIG, CHOLHDL, LDLDIRECT in the last 72 hours.  Thyroid Function Tests: No results for input(s): TSH, T4TOTAL, FREET4, T3FREE,  THYROIDAB in the last 72 hours.  Anemia Panel: No results for input(s): VITAMINB12, FOLATE, FERRITIN, TIBC, IRON, RETICCTPCT in the last 72 hours.  Urine analysis: No results found for: COLORURINE, APPEARANCEUR, LABSPEC, PHURINE, GLUCOSEU, HGBUR, BILIRUBINUR, KETONESUR, PROTEINUR, UROBILINOGEN, NITRITE, LEUKOCYTESUR  Sepsis Labs: Lactic Acid, Venous No results found for: LATICACIDVEN  MICROBIOLOGY: Recent Results (from the past 240 hour(s))  Respiratory Panel by RT PCR (Flu A&B, Covid) - Nasopharyngeal Swab     Status: None   Collection Time: 02/18/20  7:33 PM   Specimen: Nasopharyngeal Swab  Result Value Ref Range Status   SARS Coronavirus 2 by RT PCR NEGATIVE NEGATIVE Final    Comment: (NOTE) SARS-CoV-2 target nucleic acids are NOT DETECTED.  The SARS-CoV-2 RNA is generally detectable in upper respiratoy specimens during the acute phase of infection. The lowest concentration of SARS-CoV-2 viral copies this assay can detect is 131 copies/mL. A negative result does not preclude SARS-Cov-2 infection and should not be used as the sole basis for treatment or other patient management decisions. A negative result may occur with  improper specimen collection/handling, submission of specimen other than nasopharyngeal swab, presence of viral mutation(s) within the areas targeted by this assay, and inadequate number of viral copies (<131 copies/mL). A negative result must be combined with clinical observations, patient history, and epidemiological information. The expected result is Negative.  Fact Sheet for Patients:  PinkCheek.be  Fact Sheet for Healthcare Providers:  GravelBags.it  This test is no t yet approved or cleared by the [place] FDA and  has been authorized for detection and/or diagnosis of SARS-CoV-2 by FDA under an Emergency Use Authorization (EUA). This EUA will remain  in effect (meaning this test can be  used) for the duration of the COVID-19 declaration under Section 564(b)(1) of the Act, 21 U.S.C. section 360bbb-3(b)(1), unless the authorization is terminated or revoked sooner.     Influenza A by PCR NEGATIVE NEGATIVE Final   Influenza B by PCR NEGATIVE NEGATIVE Final    Comment: (NOTE) The Xpert Xpress SARS-CoV-2/FLU/RSV assay is intended as an aid in  the diagnosis of influenza from Nasopharyngeal swab specimens and  should not be used as a sole basis for treatment. Nasal washings and  aspirates are unacceptable for Xpert Xpress SARS-CoV-2/FLU/RSV  testing.  Fact Sheet for Patients: PinkCheek.be  Fact Sheet for Healthcare Providers: GravelBags.it  This test is not yet approved or cleared by the [place] FDA and  has been authorized for detection and/or diagnosis of SARS-CoV-2 by  FDA under an Emergency Use Authorization (EUA). This EUA will remain  in effect (meaning this test can be  used) for the duration of the  Covid-19 declaration under Section 564(b)(1) of the Act, 21  U.S.C. section 360bbb-3(b)(1), unless the authorization is  terminated or revoked. Performed at [facility], [address] [place], [place] [address]     RADIOLOGY STUDIES/RESULTS: CT VENOGRAM HEAD  Result Date: 02/23/2020 CLINICAL DATA:  44 year old female code stroke presentation on 02/18/2020. Persistent vertigo.  Query dural venous sinus thrombosis. EXAM: CT VENOGRAM HEAD TECHNIQUE: Multi detector CTA of the head for angiographic evaluation of the venous sinuses with multiplanar reformatted images CONTRAST:  52mL OMNIPAQUE IOHEXOL 350 MG/ML SOLN COMPARISON:  Brain MRI without and with contrast 02/19/2020, head CT CTA head and neck 02/18/2020. FINDINGS: Brain: Stable non contrast CT appearance of the brain. Faint dystrophic calcifications at the bilateral basal ganglia. Pearline Cables and white matter signal remains within normal limits. No  intracranial mass, acute intracranial hemorrhage or evidence of cortically based infarct. Calvarium and skull base: No acute osseous abnormality identified. Paranasal sinuses: Tympanic cavities appear to remain clear. There is a left mastoid effusion again noted. Right mastoids are clear. Visible paranasal sinuses remain clear. Orbits: Visualized orbits and scalp soft tissues are within normal limits. In addition to the granular appearance of the parotids on MRI there are numerous punctate bilateral sialoliths noted on CT (series 4, image 1). Venous sinuses: Preserved enhancement of the superior sagittal sinus, with multiple normal arachnoid granulations suspected at the vertex (such as on series 10, image 94 and see recent MRI series 17, image 10). Preserved enhancement of the torcula, bilateral transverse sinuses, bilateral sigmoid sinuses, bilateral IJ bulbs. Preserved enhancement of the straight sinus, vein of Galen, internal cerebral veins and basal veins of Rosenthal. The cavernous sinus appears patent and enhancing. The major intracranial arterial structures also appear to be enhancing as expected. The major cortical draining veins also appear to be enhancing. Anatomic variants: Slightly dominant right transverse and sigmoid sinus. Review of the MIP images confirms the above findings IMPRESSION: 1. Negative for dural venous sinus thrombosis. The major dural venous structures also appear normal on recent CTA, MRI. 2. Stable and negative CT appearance of the brain. 3. Left mastoid effusion again noted. Most commonly these are postinflammatory and not significant, but follow-up with ENT may be valuable considering persistent vertigo. 4. 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 Electronically Signed   By: Genevie Ann M.D.   On: 02/23/2020 20:48     LOS: 7 days   Oren Binet, MD  [facility]    To  contact the attending provider between 7A-7P or the covering provider during after hours 7P-7A, please log into the web site [email or web address] and access using universal [place] password for that web site. If you do not have the password, please call the hospital operator.  02/25/2020, 2:02 PM

## 2020-02-25 NOTE — Progress Notes (Signed)
Physical Therapy Treatment Patient Details Name: April Acevedo MRN: [id number] DOB: 03/24/1976 Today's Date: 02/25/2020    History of Present Illness  April Acevedo  is a 44 y.o. female, with history of atrial fibrillation and breast cancer -currently still taking Aromasin and Lupron, presents to the ED with a chief complaint of syncope.Etiology of syncope is uncelar.  Some concern for seizure, HA.  Multiple head scans clear including MRI CT venogram, CT and CTA.      PT Comments    Pt tolerates treatment well, ambulating for limited household distances with close supervision. PT attempts to encourage segmental turning strategies however the pt has difficulty implementing this strategy without PT cues for sequencing. Pt does seem to tolerate ambulation, transfers, and bed mobility well this session with limited reports of dizziness. Pt continues to attempt gaze stabilization exercises, however does quickly become dizzy with reduced speed of head turns. PT suggests timing the onset of dizziness in an attempt to track progress. Pt will continue to benefit from acute PT POC. PT continues to recommend discharge home with HHPT, a RW, and a 3in1 commode.   Follow Up Recommendations  Home health PT (request vestibular PT)     Equipment Recommendations  Rolling walker with 5" wheels;3in1 (PT)    Recommendations for Other Services       Precautions / Restrictions Precautions Precautions: Fall Restrictions Weight Bearing Restrictions: No    Mobility  Bed Mobility Overal bed mobility: Modified Independent (significant increase in time) Bed Mobility: Rolling;Sidelying to Sit Rolling: Modified independent (Device/Increase time) Sidelying to sit: Modified independent (Device/Increase time)          Transfers Overall transfer level: Needs assistance Equipment used: Rolling walker (2 wheeled) Transfers: Sit to/from Stand Sit to Stand: Supervision         General transfer  comment: PT provides cues for technique and hand placement  Ambulation/Gait Ambulation/Gait assistance: Supervision Gait Distance (Feet): 20 Feet (additional trial of 15') Assistive device: Rolling walker (2 wheeled) Gait Pattern/deviations: Step-through pattern Gait velocity: reduced Gait velocity interpretation: <1.31 ft/sec, indicative of household ambulator General Gait Details: short step through gait, slowed speed, PT encouraging segmental turning however pt unable to consistently implement during session   Stairs             Wheelchair Mobility    Modified Rankin (Stroke Patients Only)       Balance Overall balance assessment: Needs assistance Sitting-balance support: Single extremity supported;Feet supported;No upper extremity supported Sitting balance-Leahy Scale: Fair     Standing balance support: Bilateral upper extremity supported;Single extremity supported Standing balance-Leahy Scale: Poor Standing balance comment: reliance on external support                            Cognition Arousal/Alertness: Awake/alert Behavior During Therapy: WFL for tasks assessed/performed Overall Cognitive Status: Impaired/Different from baseline Area of Impairment: Memory                     Memory: Decreased short-term memory                Exercises Other Exercises Other Exercises: vertical and horizontal gaze stabilization exercise. Pt lasts for ~10-15 seconds prior to reporting onset of dizziness. Dizziness resolves within 30 seconds after stopping exercise. Other Exercises: PT introduces saccade gaze stabilization exercise for pt to attempt this evening with assist of spouse    General Comments General comments (skin integrity, edema, etc.): VSS on  RA      Pertinent Vitals/Pain Pain Assessment: Faces Faces Pain Scale: Hurts little more Pain Location: head Pain Descriptors / Indicators: Aching Pain Intervention(s): Monitored during  session    Home Living                      Prior Function            PT Goals (current goals can now be found in the care plan section) Acute Rehab PT Goals Patient Stated Goal: to reduce dizziness Progress towards PT goals: Progressing toward goals (slowly)    Frequency    Min 4X/week      PT Plan Current plan remains appropriate    Co-evaluation              AM-PAC PT "6 Clicks" Mobility   Outcome Measure  Help needed turning from your back to your side while in a flat bed without using bedrails?: None Help needed moving from lying on your back to sitting on the side of a flat bed without using bedrails?: None Help needed moving to and from a bed to a chair (including a wheelchair)?: None Help needed standing up from a chair using your arms (e.g., wheelchair or bedside chair)?: None Help needed to walk in hospital room?: None Help needed climbing 3-5 steps with a railing? : A Lot 6 Click Score: 22    End of Session   Activity Tolerance: Patient tolerated treatment well Patient left: in chair;with call bell/phone within reach;with chair alarm set;with family/visitor present Nurse Communication: Mobility status PT Visit Diagnosis: Other abnormalities of gait and mobility (R26.89);Other symptoms and signs involving the nervous system (R29.898);Pain;Dizziness and giddiness (R42) Pain - part of body:  (head)     Time: 0177-9390 PT Time Calculation (min) (ACUTE ONLY): 29 min  Charges:  $Gait Training: 8-22 mins $Therapeutic Exercise: 8-22 mins                     Zenaida Niece, PT, DPT Acute Rehabilitation Pager: [phone]    Zenaida Niece 02/25/2020, 5:39 PM

## 2020-02-25 NOTE — Progress Notes (Addendum)
   02/25/20 1038  Orthostatic Lying   BP- Lying 97/61  Pulse- Lying 58  Orthostatic Sitting  BP- Sitting 104/72  Pulse- Sitting 86  Orthostatic Standing at 0 minutes  BP- Standing at 0 minutes 104/69  Pulse- Standing at 0 minutes 83  Orthostatic Standing at 3 minutes  BP- Standing at 3 minutes 100/76  Pulse- Standing at 3 minutes 87   Orthostatic Vital signs

## 2020-02-26 ENCOUNTER — Telehealth: Payer: Self-pay | Admitting: Neurology

## 2020-02-26 DIAGNOSIS — R4182 Altered mental status, unspecified: Secondary | ICD-10-CM | POA: Diagnosis not present

## 2020-02-26 DIAGNOSIS — G4489 Other headache syndrome: Secondary | ICD-10-CM | POA: Diagnosis not present

## 2020-02-26 DIAGNOSIS — R55 Syncope and collapse: Secondary | ICD-10-CM | POA: Diagnosis not present

## 2020-02-26 DIAGNOSIS — K5900 Constipation, unspecified: Secondary | ICD-10-CM | POA: Diagnosis not present

## 2020-02-26 DIAGNOSIS — I4891 Unspecified atrial fibrillation: Secondary | ICD-10-CM | POA: Diagnosis not present

## 2020-02-26 MED ORDER — NONFORMULARY OR COMPOUNDED ITEM: 0 refills | Status: DC

## 2020-02-26 MED ORDER — CHLORPROMAZINE HCL 100 MG PO TABS
100.0000 mg | ORAL_TABLET | Freq: Two times a day (BID) | ORAL | Status: AC
  Administered 2020-02-26 – 2020-02-27 (×2): 100 mg via ORAL
  Filled 2020-02-26 (×2): qty 1

## 2020-02-26 MED ORDER — TOPIRAMATE 25 MG PO TABS
50.0000 mg | ORAL_TABLET | Freq: Every day | ORAL | Status: DC
  Administered 2020-02-26 – 2020-02-28 (×3): 50 mg via ORAL
  Filled 2020-02-26 (×3): qty 2

## 2020-02-26 MED ORDER — SUMATRIPTAN SUCCINATE 6 MG/0.5ML ~~LOC~~ SOLN
6.0000 mg | SUBCUTANEOUS | Status: DC | PRN
  Filled 2020-02-26 (×2): qty 0.5

## 2020-02-26 MED ORDER — PROCHLORPERAZINE EDISYLATE 10 MG/2ML IJ SOLN
10.0000 mg | Freq: Once | INTRAMUSCULAR | Status: AC
  Administered 2020-02-26: 10 mg via INTRAVENOUS
  Filled 2020-02-26: qty 2

## 2020-02-26 NOTE — Care Management (Signed)
Noted that referral had been sent to [facility] for OP PT. Re-sent referral to include vestibular PT.

## 2020-02-26 NOTE — Progress Notes (Addendum)
NEURO HOSPITALIST PROGRESS NOTE   Subjective: Recalled to see the patient for unremitting headaches and dizziness. Briefly, patient was seen by my partner Dr.Khaliqdina, in initial consultation on 02/20/2020, for episode of 2 minutes of passing out with prolonged somnolence and confusion afterwards.  There was concern for seizure versus syncope.  Brain MRI and routine EEG negative for structural abnormality or epileptiform discharges.  Given the first-time seizure, no AEDs were given.  She has a past medical history of atrial fibrillation not on anticoagulation due to interaction with her breast cancer medications, breast cancer, anxiety. She has been complaining of a headache since the past few days.  Case was discussed over the phone with the hospitalist.  IV Toradol, Benadryl, Reglan, Depakote and steroids have been tried without much relief and neurology was asked to revisit for further recommendations.  I spoke with the patient and her husband.  She reports the headache being frontal, throbbing in nature, sharp, with some positional component where she only feels comfortable when she is laying in bed at about a 45 degree angle.  Any sudden movements exacerbate the pain.  She has photophobia.  No significant photophobia.  Any neck movement exacerbates the pain.  She also complains of insomnia-has not been able to sleep well for many days now.  Also is very anxious because of her breast cancer treatment and has been anxious for the last 4 years since her diagnosis of breast cancer.  She also describes the headache being worse if she sleeps on her right side but better when she sleeps on the left.  Initial dose of steroids did help with a headache some but the dizziness-that she describes as the feeling of her being imbalance, has not changed much since then.  She is also on meclizine.  Recently moved to the area from [place].  No primary care set up yet.  No oncology care set up  yet.  Is due for her cancer treatment with Lupron-was supposed to be monthly but has not gotten doses with the past 60 days.   Exam: Vitals:   02/26/20 0838 02/26/20 1200  BP: 111/76 108/65  Pulse: (!) 53 77  Resp: 18 18  Temp: 97.6 F (36.4 C) 98.9 F (37.2 C)  SpO2: 100% 100%    Physical Exam  Constitutional: Appears well-developed and well-nourished.  Psych: flat affect Eyes: Normal external eye and conjunctiva. HENT: Normocephalic, no lesions, without obvious abnormality.  No nuchal rigidity but on passive movement of the neck reports of worsening headache.  Unable to perform funduscopy as she would not tolerate light for more than a fraction of a second in her eyes. Musculoskeletal- WNL c/o pain in neck and shoulders Cardiovascular: Normal rate and regular rhythm.  Respiratory: Effort normal, non-labored breathing saturations WNL GI: Soft.  No distension. There is no tenderness.  Skin: WDI   Neuro:  Mental Status: Alert, oriented, thought content appropriate.  Speech fluent without evidence of aphasia.  Able to follow commands without difficulty.  In spite of headache being bad, does not appear to be in discuss congruent to the headache. Cranial Nerves: II:  Visual fields grossly normal,  III,IV, VI: No ptosis.  Testing her extraocular movements, she kept closing her eyes with any movement saying that that hurts her head.  Did not complain of eyes hurting V,VII: smile symmetric, facial light touch sensation normal bilaterally  VIII: hearing normal bilaterally IX,X: uvula rises symmetrically XI: bilateral shoulder shrug symmetric but reports of pain in the head while shoulder shrugging. XII: midline tongue extension Motor: Antigravity in all fours without drift Tone and bulk:normal tone throughout; no atrophy noted Sensory:  light touch intact throughout, bilaterally Cerebellar: Very slow to perform finger-nose-finger testing, almost look like volitionally slow.  No  ataxia. Gait: deferred    Medications:  Scheduled: . busPIRone  7.5 mg Oral Daily  . chlorproMAZINE  100 mg Oral BID  . exemestane  25 mg Oral QPC breakfast  . heparin  5,000 Units Subcutaneous Q8H  . polyethylene glycol  17 g Oral BID  . prochlorperazine  10 mg Intravenous Once  . propranolol  20 mg Oral Daily  . senna  1 tablet Oral QHS  . topiramate  25 mg Oral QHS  . traZODone  100 mg Oral QHS   Continuous:  HBZ:JIRCVELFYBOFB **OR** [DISCONTINUED] acetaminophen (TYLENOL) oral liquid 160 mg/5 mL **OR** [DISCONTINUED] acetaminophen, bisacodyl, ketorolac, magic mouthwash, meclizine, ondansetron (ZOFRAN) IV, zolpidem  Pertinent Labs/Diagnostics:   No results found.   Laurey Morale, MSN, NP-C [facility] [phone]  Attending neurologist's note to follow  02/26/2020, 1:54 PM  Attending addendum Patient seen and examined.  Recalled for evaluation for headache and dizziness that has not really been relieved. Please see my additions to the subjective part above. I have also made edits to the examination. MRI head normal CTV of the head negative for venous sinus thrombosis EEG was unremarkable.  Assessment:  44 year old female with PMH for A. Fib (not anti coagulated), RA, breast cancer who presented for syncopal episode, but also c/o 10/10 headache and dizziness which has not responded to multiple medication. MRI was negative for metastatic disease. CTV was negative for venous sinus thrombosis.  EEG was negative for any abnormalities. She describes her headache as a throbbing headache with throbbing in her ears as well as photophobia but has no history of migraines.  Her examination although fairly unremarkable had some features that make me feel that her anxiety might be playing a role in her current presentation-she was very slow to follow certain instructions and slow to do certain actions as well as what seemed like disproportionate pain to the actions  that were passively being performed on head and neck as well as incongruent affect to the pain.  That said, her headaches might be new migraines and it is imperative that the cycle of headaches be broken and for that I will make some suggestions below.  I was able to discuss this case curbside with our headache specialist as well.  One of the last considerations, although her MRI brain with and without contrast did not show any meningeal involvement, would be to consider spinal tap.  The utility of that would be twofold. 1.  To look for leptomeningeal carcinomatosis 2.  To look for any evidence of increased intracranial pressure-although she is not on any medications that should cause that and is not obese but that could still be because of unremitting headaches.  Recommendations:  -Thorazine 100 mg BID for 1 day -Sumatriptan 6 mg subcutaneous injections every 2 hour as needed for 2 doses maximum. -Compazine 10 mg once tonight at bedtime (2200)-we will help headache as well as will help her sleep.  She can continue to take her Ambien and trazodone. -Continue meclizine -Also increase Topamax to 50 twice daily and continue at discharge. -I have spoken with the headache specialist who is  going to be able to see her sometime this week for possible trigger point injections, which might be beneficial for her. -Aromatase inhibitors can cause arthralgias and common side effects do include headaches but she has been on these therapies for many years.  I will not change these therapies because they are beyond my area of expertise but I would ask oncology to consider if there are any alternatives that can be used and tried.  Interestingly enough fatigue arthralgias headache insomnia hot flashes nausea myalgia are all listed side effects of Aromasin. -She needs to establish her oncology care and primary care-primary hospitalist is already working on providing referrals for that.  I will sign out her care to my  oncoming partner who will reassess her tomorrow.  -- Amie Portland, MD [facility] Pager: [phone] If 7pm to 7am, please call on call as listed on AMION.

## 2020-02-26 NOTE — Progress Notes (Signed)
PROGRESS NOTE        PATIENT DETAILS Name: April Acevedo Age: 44 y.o. Sex: female Date of Birth: [birth year] Admit Date: 02/18/2020 Admitting Physician Rolla Plate, DO [id number], No Pcp Per  Brief Narrative: Patient is a 44 y.o. female PAF-not on anticoagulation, breast cancer-presented to the hospital following a syncopal episode-further hospital course complicated by peripheral vertigo and frontal headaches.  See below for further details.  Significant events: 10/16>> admit to [facility] for evaluation of syncope  Significant studies: 10/16>> CT head: No acute abnormalities 10/16>> CTA head and neck: No large vessel occlusion or stenosis or aneurysm in the head and neck. 10/17>> MRI brain: No metastatic disease acute intracranial abnormality 10/17>> Echo: EF 55-60% 10/19>> X-ray abdomen: No acute abnormality 10/21>> CT venogram head: Negative for dural venous sinus thrombosis  Antimicrobial therapy: Augmentin: 10/19>>  Microbiology data: None  Procedures : 10/18>> EEG: No seizure activity.  Consults: Neurology  DVT Prophylaxis : heparin injection 5,000 Units Start: 02/18/20 2345 SCD's Start: 02/18/20 2344  Subjective: Patient in bed, appears comfortable, denies any headache, no fever, no chest pain or pressure, no shortness of breath , no abdominal pain. No focal weakness.  Vertigo symptoms are gradually improving.   Assessment/Plan:  Syncope: Etiology unclear-some concern for seizures-evaluated by neurology-advised to hold off on AEDs unless patient has recurrent symptoms.  Telemetry negative for arrhythmia-echo with preserved EF.  Per neurology-no driving for 6 months with full seizure precautions.  Home health equipment ordered.  Vertigo: Vertigo appears positional-MRI brain without any acute abnormalities.  CT venogram brain with no evidence of dural venous sinus thrombosis.  Continue as needed meclizine-continued attempts at  physical therapy-seems to be slowly improving-continue supportive care.  Will need outpatient vestibular therapy.  Headache: Continues to have frontal headaches-however some improvement after IV Depacon on 10/22-headaches down to 3-5/10.  Patient has no prior history of migraine headaches-but headache has some migrainous features (likes room to be quiet/dark-endorses photophobia)-on almost daily dosing of ibuprofen for arthritis for the past several months.  Suspect this may be headache related to medication overuse.  Hardly any response to a cocktail of Toradol/Reglan/Benadryl-improvement after repeat Depacon 500 mg IV & Solu-Medrol 500 mg on 02/25/2020, continue on Topamax 25 mg nightly for maintenance.  Empirically on Augmentin-given mastoid effusion seen on imaging studies-last day on 10/24.  CT venogram brain negative for dural venous sinus thrombosis-MRI brain negative for structural abnormalities.    PAF: Remains in sinus rhythm-Per prior documentation-not on anticoagulation due to interaction with her cancer medications.  Continue propranolol-BP soft (orthostatics negative) today-hence dose decreased to 20 mg.  History of breast cancer-s/p double mastectomy 4 years back in [place]: Maintained on Aromasin and Lupron-we will need to establish with outpatient oncology as patient just moved into this area.  Apparently has not taken Lupron in the past several months.  [facility] sent.  Insomnia: Continue trazodone-added as needed Ambien-claims she finally slept well last night.  3 mm left upper lung nodule: Stable for outpatient follow-up with pulmonary.  2.7 cm left thyroid nodule: Stable for outpatient follow-up with endocrine-TSH within normal limits.  Abnormal appearance of salivary gland and imaging studies: No clinical features consistent with Sjogren's symptoms-doubt further work-up required while inpatient   Diet: Diet Order            Diet regular Room service  appropriate? Yes; Fluid consistency: Thin  Diet effective now                  Code Status: Full code   Family Communication: Spouse at bedside 02/26/20  Disposition Plan: Status is: Inpatient  Remains inpatient appropriate because:Inpatient level of care appropriate due to severity of illness   Dispo: The patient is from: Home              Anticipated d/c is to: Home              Anticipated d/c date is: 2 days              Patient currently is not medically stable to d/c.  Barriers to Discharge: Ongoing severe vertigo-headache-needing inpatient therapies-see above.  Antimicrobial agents: Anti-infectives (From admission, onward)   Start     Dose/Rate Route Frequency Ordered Stop   02/21/20 1345  amoxicillin-clavulanate (AUGMENTIN) 875-125 MG per tablet 1 tablet        1 tablet Oral Every 12 hours 02/21/20 1258 02/25/20 2306       Time spent: 25- minutes-Greater than 50% of this time was spent in counseling, explanation of diagnosis, planning of further management, and coordination of care.  MEDICATIONS: Scheduled Meds: . busPIRone  7.5 mg Oral Daily  . exemestane  25 mg Oral QPC breakfast  . heparin  5,000 Units Subcutaneous Q8H  . polyethylene glycol  17 g Oral BID  . propranolol  20 mg Oral Daily  . senna  1 tablet Oral QHS  . topiramate  25 mg Oral QHS  . traZODone  100 mg Oral QHS   Continuous Infusions: PRN Meds:.acetaminophen **OR** [DISCONTINUED] acetaminophen (TYLENOL) oral liquid 160 mg/5 mL **OR** [DISCONTINUED] acetaminophen, bisacodyl, ketorolac, magic mouthwash, meclizine, ondansetron (ZOFRAN) IV, zolpidem   PHYSICAL EXAM: Vital signs: Vitals:   02/25/20 2031 02/26/20 0000 02/26/20 0409 02/26/20 0838  BP: 112/69 109/71 101/65 111/76  Pulse: 78 (!) 57 68 (!) 53  Resp: 17 18 17 18   Temp: 98 F (36.7 C) 97.9 F (36.6 C) 97.9 F (36.6 C) 97.6 F (36.4 C)  TempSrc: Oral Oral Oral Oral  SpO2: 96% 98% 99% 100%  Weight:      Height:        Filed Weights   02/18/20 1826  Weight: 54.4 kg   Body mass index is 19.97 kg/m.   Gen Exam:  Awake Alert, No new F.N deficits, Normal affect [place].AT,PERRAL Supple Neck,No JVD, No cervical lymphadenopathy appriciated.  Symmetrical Chest wall movement, Good air movement bilaterally, CTAB RRR,No Gallops, Rubs or new Murmurs, No Parasternal Heave +ve B.Sounds, Abd Soft, No tenderness, No organomegaly appriciated, No rebound - guarding or rigidity. No Cyanosis, Clubbing or edema, No new Rash or bruise  I have personally reviewed following labs and imaging studies  LABORATORY DATA: CBC: Recent Labs  Lab 02/23/20 0217 02/24/20 0138 02/25/20 0201  WBC 3.2* 4.9 5.0  HGB 11.4* 12.8 12.6  HCT 35.9* 39.5 38.9  MCV 83.9 82.8 82.1  PLT 151 182 409    Basic Metabolic Panel: Recent Labs  Lab 02/23/20 0217 02/24/20 0138 02/25/20 0201  NA 141 138 139  K 3.8 3.9 3.7  CL 112* 105 106  CO2 23 23 25   GLUCOSE 93 128* 105*  BUN 15 13 17   CREATININE 0.90 0.91 0.93  CALCIUM 8.7* 9.5 9.0    GFR: Estimated Creatinine Clearance: 65.6 mL/min (by C-G formula based on SCr of 0.93 mg/dL).  Liver Function  Tests: Recent Labs  Lab 02/23/20 0217 02/25/20 0201  AST 25 40  ALT 19 35  ALKPHOS 40 41  BILITOT 0.3 0.4  PROT 6.9 7.7  ALBUMIN 3.0* 3.3*   No results for input(s): LIPASE, AMYLASE in the last 168 hours. No results for input(s): AMMONIA in the last 168 hours.  Coagulation Profile: No results for input(s): INR, PROTIME in the last 168 hours.  Cardiac Enzymes: No results for input(s): CKTOTAL, CKMB, CKMBINDEX, TROPONINI in the last 168 hours.  BNP (last 3 results) No results for input(s): PROBNP in the last 8760 hours.  Lipid Profile: No results for input(s): CHOL, HDL, LDLCALC, TRIG, CHOLHDL, LDLDIRECT in the last 72 hours.  Thyroid Function Tests: No results for input(s): TSH, T4TOTAL, FREET4, T3FREE, THYROIDAB in the last 72 hours.  Anemia Panel: No results for  input(s): VITAMINB12, FOLATE, FERRITIN, TIBC, IRON, RETICCTPCT in the last 72 hours.  Urine analysis: No results found for: COLORURINE, APPEARANCEUR, LABSPEC, PHURINE, GLUCOSEU, HGBUR, BILIRUBINUR, KETONESUR, PROTEINUR, UROBILINOGEN, NITRITE, LEUKOCYTESUR  Sepsis Labs: Lactic Acid, Venous No results found for: LATICACIDVEN  MICROBIOLOGY: Recent Results (from the past 240 hour(s))  Respiratory Panel by RT PCR (Flu A&B, Covid) - Nasopharyngeal Swab     Status: None   Collection Time: 02/18/20  7:33 PM   Specimen: Nasopharyngeal Swab  Result Value Ref Range Status   SARS Coronavirus 2 by RT PCR NEGATIVE NEGATIVE Final    Comment: (NOTE) SARS-CoV-2 target nucleic acids are NOT DETECTED.  The SARS-CoV-2 RNA is generally detectable in upper respiratoy specimens during the acute phase of infection. The lowest concentration of SARS-CoV-2 viral copies this assay can detect is 131 copies/mL. A negative result does not preclude SARS-Cov-2 infection and should not be used as the sole basis for treatment or other patient management decisions. A negative result may occur with  improper specimen collection/handling, submission of specimen other than nasopharyngeal swab, presence of viral mutation(s) within the areas targeted by this assay, and inadequate number of viral copies (<131 copies/mL). A negative result must be combined with clinical observations, patient history, and epidemiological information. The expected result is Negative.  Fact Sheet for Patients:  PinkCheek.be  Fact Sheet for Healthcare Providers:  GravelBags.it  This test is no t yet approved or cleared by the [place] FDA and  has been authorized for detection and/or diagnosis of SARS-CoV-2 by FDA under an Emergency Use Authorization (EUA). This EUA will remain  in effect (meaning this test can be used) for the duration of the COVID-19 declaration under  Section 564(b)(1) of the Act, 21 U.S.C. section 360bbb-3(b)(1), unless the authorization is terminated or revoked sooner.     Influenza A by PCR NEGATIVE NEGATIVE Final   Influenza B by PCR NEGATIVE NEGATIVE Final    Comment: (NOTE) The Xpert Xpress SARS-CoV-2/FLU/RSV assay is intended as an aid in  the diagnosis of influenza from Nasopharyngeal swab specimens and  should not be used as a sole basis for treatment. Nasal washings and  aspirates are unacceptable for Xpert Xpress SARS-CoV-2/FLU/RSV  testing.  Fact Sheet for Patients: PinkCheek.be  Fact Sheet for Healthcare Providers: GravelBags.it  This test is not yet approved or cleared by the [place] FDA and  has been authorized for detection and/or diagnosis of SARS-CoV-2 by  FDA under an Emergency Use Authorization (EUA). This EUA will remain  in effect (meaning this test can be used) for the duration of the  Covid-19 declaration under Section 564(b)(1) of the Act, 21  U.S.C.  section 360bbb-3(b)(1), unless the authorization is  terminated or revoked. Performed at [facility], [address], [place], [place] [address]     RADIOLOGY STUDIES/RESULTS: No results found.   LOS: 8 days   Signature  Lala Lund M.D on 02/26/2020 at 11:19 AM   -  To page go to [email or web address]

## 2020-02-26 NOTE — Telephone Encounter (Signed)
April Acevedo and [place]: Requested by Dr. Rory Percy as hospital follow up. Let's see if we can get her an appointment this week. thanks

## 2020-02-27 DIAGNOSIS — K5900 Constipation, unspecified: Secondary | ICD-10-CM | POA: Diagnosis not present

## 2020-02-27 DIAGNOSIS — R4182 Altered mental status, unspecified: Secondary | ICD-10-CM | POA: Diagnosis not present

## 2020-02-27 DIAGNOSIS — I4891 Unspecified atrial fibrillation: Secondary | ICD-10-CM | POA: Diagnosis not present

## 2020-02-27 DIAGNOSIS — G4489 Other headache syndrome: Secondary | ICD-10-CM | POA: Diagnosis not present

## 2020-02-27 DIAGNOSIS — R55 Syncope and collapse: Secondary | ICD-10-CM | POA: Diagnosis not present

## 2020-02-27 LAB — GLUCOSE, CAPILLARY: Glucose-Capillary: 83 mg/dL (ref 70–99)

## 2020-02-27 MED ORDER — LACTATED RINGERS IV SOLN: INTRAVENOUS | Status: AC

## 2020-02-27 MED ORDER — PROPRANOLOL HCL 10 MG PO TABS
10.0000 mg | ORAL_TABLET | Freq: Every day | ORAL | Status: DC
  Administered 2020-02-27 – 2020-02-29 (×3): 10 mg via ORAL
  Filled 2020-02-27 (×3): qty 1

## 2020-02-27 NOTE — Progress Notes (Signed)
Subjective: Feels her headache is much better.  She apparently got lightheaded and passed out earlier when working with PT.  Exam: Vitals:   02/27/20 0909 02/27/20 1051  BP: 92/63 (!) 93/58  Pulse: 61 (!) 56  Resp: 18 18  Temp: 98.4 F (36.9 C)   SpO2: 98% 98%   Gen: In bed, NAD Resp: non-labored breathing, no acute distress Abd: soft, nt  Neuro: MS: Drowsy but arousable, follows commands readily once awakened. CN: Visual fields full, EOMI Motor: Moves all extremities well  Orthostatic vital signs: No drop in BP, but her pulse does increase from 61 while sitting to over 100 while standing.  Impression: 44 year old female with orthostatic syncope.  She also has had headaches, though this appears to have responded well to Thorazine.   Of note, trazodone is associated with up to 10% incidence of orthostatic hypotension, therefore I have discontinued this.  Recommendations: 1) discontinue trazodone 2) continue topiramate  Roland Rack, MD [facility] [phone]  If 7pm- 7am, please page neurology on call as listed in [place].

## 2020-02-27 NOTE — Progress Notes (Signed)
PROGRESS NOTE        PATIENT DETAILS Name: April Acevedo Age: 44 y.o. Sex: female Date of Birth: [birth year] Admit Date: 02/18/2020 Admitting Physician Rolla Plate, DO DEY:[id number], No Pcp Per  Brief Narrative: Patient is a 44 y.o. female PAF-not on anticoagulation, breast cancer-presented to the hospital following a syncopal episode-further hospital course complicated by peripheral vertigo and frontal headaches.  See below for further details.  Significant events: 10/16>> admit to [facility] for evaluation of syncope  Significant studies: 10/16>> CT head: No acute abnormalities 10/16>> CTA head and neck: No large vessel occlusion or stenosis or aneurysm in the head and neck. 10/17>> MRI brain: No metastatic disease acute intracranial abnormality 10/17>> Echo: EF 55-60% 10/19>> X-ray abdomen: No acute abnormality 10/21>> CT venogram head: Negative for dural venous sinus thrombosis  Antimicrobial therapy: Augmentin: 10/19>>  Microbiology data: None  Procedures : 10/18>> EEG: No seizure activity.  Consults: Neurology  DVT Prophylaxis : Place TED hose Start: 02/27/20 1033 heparin injection 5,000 Units Start: 02/18/20 2345 SCD's Start: 02/18/20 2344  Subjective: Patient in bed, appears comfortable, denies any headache, no fever, no chest pain or pressure, no shortness of breath , no abdominal pain. No focal weakness.  Vertigo symptoms are gradually improving.   Assessment/Plan:  Syncope: Etiology unclear-some concern for seizures-evaluated by neurology-advised to hold off on AEDs unless patient has recurrent symptoms.  Telemetry negative for arrhythmia-echo with preserved EF, could be orthostatic, add TED stockings and gentle hydration on 02/27/2020 due to repeat episode while working with PT on 02/27/2020, EKG remained stable, no focal deficits, head CT and MRI nonacute, discussed with neurology they will evaluate her again on 02/27/2020 as  well.  Previously per neurology-no driving for 6 months with full seizure precautions.  Home health equipment ordered.  Vertigo: Vertigo appears positional-MRI brain without any acute abnormalities.  CT venogram brain with no evidence of dural venous sinus thrombosis.  Continue as needed meclizine-continued attempts at physical therapy-seems to be slowly improving-continue supportive care.  Will need outpatient vestibular therapy.  Headache: Continues to have frontal headaches-however some improvement after IV Depacon on 10/22-headaches down to 3-5/10.  Patient has no prior history of migraine headaches-but headache has some migrainous features (likes room to be quiet/dark-endorses photophobia)-on almost daily dosing of ibuprofen for arthritis for the past several months.  Suspect this may be headache related to medication overuse.  Hardly any response to a cocktail of Toradol/Reglan/Benadryl-improvement after repeat Depacon 500 mg IV & Solu-Medrol 500 mg on 02/25/2020, continue on Topamax 25 mg nightly for maintenance.  Empirically on Augmentin-given mastoid effusion seen on imaging studies-last day on 10/24.  CT venogram brain negative for dural venous sinus thrombosis-MRI brain negative for structural abnormalities.    PAF: Remains in sinus rhythm-Per prior documentation-not on anticoagulation due to interaction with her cancer medications.  Beta-blocker dose reduced further on 02/27/2020 due to orthostatic hypotension.  History of breast cancer-s/p double mastectomy 4 years back in [place]: Maintained on Aromasin and Lupron-we will need to establish with outpatient oncology as patient just moved into this area.  Apparently has not taken Lupron in the past several months.  [facility].  Insomnia: Continue trazodone-added as needed Ambien-claims she finally slept well last night.  3 mm left upper lung nodule: Stable for outpatient follow-up with pulmonary.  2.7 cm left thyroid  nodule: Stable for outpatient follow-up with endocrine-TSH within normal limits.  Abnormal appearance of salivary gland and imaging studies: No clinical features consistent with Sjogren's symptoms-doubt further work-up required while inpatient, PCP to arrange for outpatient rheumatology follow-up.   Diet: Diet Order            Diet regular Room service appropriate? Yes; Fluid consistency: Thin  Diet effective now                  Code Status: Full code   Family Communication: Spouse at bedside 02/26/20, 02/27/20  Disposition Plan: Status is: Inpatient  Remains inpatient appropriate because:Inpatient level of care appropriate due to severity of illness   Dispo: The patient is from: Home              Anticipated d/c is to: Home              Anticipated d/c date is: 2 days              Patient currently is not medically stable to d/c.  Barriers to Discharge: Ongoing severe vertigo-headache-needing inpatient therapies-see above.  Antimicrobial agents: Anti-infectives (From admission, onward)   Start     Dose/Rate Route Frequency Ordered Stop   02/21/20 1345  amoxicillin-clavulanate (AUGMENTIN) 875-125 MG per tablet 1 tablet        1 tablet Oral Every 12 hours 02/21/20 1258 02/25/20 2306       Time spent: 25- minutes-Greater than 50% of this time was spent in counseling, explanation of diagnosis, planning of further management, and coordination of care.  MEDICATIONS: Scheduled Meds: . busPIRone  7.5 mg Oral Daily  . exemestane  25 mg Oral QPC breakfast  . heparin  5,000 Units Subcutaneous Q8H  . polyethylene glycol  17 g Oral BID  . propranolol  10 mg Oral Daily  . senna  1 tablet Oral QHS  . topiramate  50 mg Oral QHS  . traZODone  100 mg Oral QHS   Continuous Infusions: . lactated ringers 125 mL/hr at 02/27/20 1040   PRN Meds:.acetaminophen **OR** [DISCONTINUED] acetaminophen (TYLENOL) oral liquid 160 mg/5 mL **OR** [DISCONTINUED] acetaminophen, bisacodyl,  ketorolac, magic mouthwash, meclizine, ondansetron (ZOFRAN) IV, SUMAtriptan, zolpidem   PHYSICAL EXAM: Vital signs: Vitals:   02/26/20 2342 02/27/20 0348 02/27/20 0909 02/27/20 1051  BP: 95/62 (!) 91/55 92/63 (!) 93/58  Pulse: (!) 55 (!) 56 61 (!) 56  Resp: 16 16 18 18   Temp: 99.3 F (37.4 C) 98.1 F (36.7 C) 98.4 F (36.9 C)   TempSrc: Oral Oral Oral   SpO2: 99% 98% 98% 98%  Weight:      Height:       Filed Weights   02/18/20 1826  Weight: 54.4 kg   Body mass index is 19.97 kg/m.   Gen Exam:  Awake Alert, No new F.N deficits, flat affect [place].AT,PERRAL Supple Neck,No JVD, No cervical lymphadenopathy appriciated.  Symmetrical Chest wall movement, Good air movement bilaterally, CTAB RRR,No Gallops, Rubs or new Murmurs, No Parasternal Heave +ve B.Sounds, Abd Soft, No tenderness, No organomegaly appriciated, No rebound - guarding or rigidity. No Cyanosis, Clubbing or edema, No new Rash or bruise   I have personally reviewed following labs and imaging studies  LABORATORY DATA: CBC: Recent Labs  Lab 02/23/20 0217 02/24/20 0138 02/25/20 0201  WBC 3.2* 4.9 5.0  HGB 11.4* 12.8 12.6  HCT 35.9* 39.5 38.9  MCV 83.9 82.8 82.1  PLT 151 182 846    Basic Metabolic Panel:  Recent Labs  Lab 02/23/20 0217 02/24/20 0138 02/25/20 0201  NA 141 138 139  K 3.8 3.9 3.7  CL 112* 105 106  CO2 23 23 25   GLUCOSE 93 128* 105*  BUN 15 13 17   CREATININE 0.90 0.91 0.93  CALCIUM 8.7* 9.5 9.0    GFR: Estimated Creatinine Clearance: 65.6 mL/min (by C-G formula based on SCr of 0.93 mg/dL).  Liver Function Tests: Recent Labs  Lab 02/23/20 0217 02/25/20 0201  AST 25 40  ALT 19 35  ALKPHOS 40 41  BILITOT 0.3 0.4  PROT 6.9 7.7  ALBUMIN 3.0* 3.3*   No results for input(s): LIPASE, AMYLASE in the last 168 hours. No results for input(s): AMMONIA in the last 168 hours.  Coagulation Profile: No results for input(s): INR, PROTIME in the last 168 hours.  Cardiac Enzymes: No  results for input(s): CKTOTAL, CKMB, CKMBINDEX, TROPONINI in the last 168 hours.  BNP (last 3 results) No results for input(s): PROBNP in the last 8760 hours.  Lipid Profile: No results for input(s): CHOL, HDL, LDLCALC, TRIG, CHOLHDL, LDLDIRECT in the last 72 hours.  Thyroid Function Tests: No results for input(s): TSH, T4TOTAL, FREET4, T3FREE, THYROIDAB in the last 72 hours.  Anemia Panel: No results for input(s): VITAMINB12, FOLATE, FERRITIN, TIBC, IRON, RETICCTPCT in the last 72 hours.  Urine analysis: No results found for: COLORURINE, APPEARANCEUR, LABSPEC, PHURINE, GLUCOSEU, HGBUR, BILIRUBINUR, KETONESUR, PROTEINUR, UROBILINOGEN, NITRITE, LEUKOCYTESUR  Sepsis Labs: Lactic Acid, Venous No results found for: LATICACIDVEN  MICROBIOLOGY: Recent Results (from the past 240 hour(s))  Respiratory Panel by RT PCR (Flu A&B, Covid) - Nasopharyngeal Swab     Status: None   Collection Time: 02/18/20  7:33 PM   Specimen: Nasopharyngeal Swab  Result Value Ref Range Status   SARS Coronavirus 2 by RT PCR NEGATIVE NEGATIVE Final    Comment: (NOTE) SARS-CoV-2 target nucleic acids are NOT DETECTED.  The SARS-CoV-2 RNA is generally detectable in upper respiratoy specimens during the acute phase of infection. The lowest concentration of SARS-CoV-2 viral copies this assay can detect is 131 copies/mL. A negative result does not preclude SARS-Cov-2 infection and should not be used as the sole basis for treatment or other patient management decisions. A negative result may occur with  improper specimen collection/handling, submission of specimen other than nasopharyngeal swab, presence of viral mutation(s) within the areas targeted by this assay, and inadequate number of viral copies (<131 copies/mL). A negative result must be combined with clinical observations, patient history, and epidemiological information. The expected result is Negative.  Fact Sheet for Patients:    PinkCheek.be  Fact Sheet for Healthcare Providers:  GravelBags.it  This test is no t yet approved or cleared by the [place] FDA and  has been authorized for detection and/or diagnosis of SARS-CoV-2 by FDA under an Emergency Use Authorization (EUA). This EUA will remain  in effect (meaning this test can be used) for the duration of the COVID-19 declaration under Section 564(b)(1) of the Act, 21 U.S.C. section 360bbb-3(b)(1), unless the authorization is terminated or revoked sooner.     Influenza A by PCR NEGATIVE NEGATIVE Final   Influenza B by PCR NEGATIVE NEGATIVE Final    Comment: (NOTE) The Xpert Xpress SARS-CoV-2/FLU/RSV assay is intended as an aid in  the diagnosis of influenza from Nasopharyngeal swab specimens and  should not be used as a sole basis for treatment. Nasal washings and  aspirates are unacceptable for Xpert Xpress SARS-CoV-2/FLU/RSV  testing.  Fact Sheet for Patients:  PinkCheek.be  Fact Sheet for Healthcare Providers: GravelBags.it  This test is not yet approved or cleared by the [place] FDA and  has been authorized for detection and/or diagnosis of SARS-CoV-2 by  FDA under an Emergency Use Authorization (EUA). This EUA will remain  in effect (meaning this test can be used) for the duration of the  Covid-19 declaration under Section 564(b)(1) of the Act, 21  U.S.C. section 360bbb-3(b)(1), unless the authorization is  terminated or revoked. Performed at [facility], [address], [place], [place] [address]     RADIOLOGY STUDIES/RESULTS: No results found.   LOS: 9 days   Signature  Lala Lund M.D on 02/27/2020 at 11:32 AM   -  To page go to [email or web address]

## 2020-02-27 NOTE — Progress Notes (Signed)
Patient was working with OT when her HR dropped to 44 and she went unresponsive for a minute BP was 103/68. She came back and opened her eyes, oriented and following commands but with very sluggish responses and having a difficult time keeping eyes open. MD paged and he placed orders for stat EKG and fluids and to keep her laying flat for a while. HR now 55, patient seems extremely fatigued and still slow with responses, CBG 83 RR 18, BP 93/58.

## 2020-02-27 NOTE — Progress Notes (Signed)
Orthostatic Vitals   02/27/20 1710  Orthostatic Lying   BP- Lying 96/60  Pulse- Lying 73  Orthostatic Sitting  BP- Sitting 98/64  Pulse- Sitting 61  Orthostatic Standing at 0 minutes  BP- Standing at 0 minutes 103/78  Pulse- Standing at 0 minutes 102  Orthostatic Standing at 3 minutes  BP- Standing at 3 minutes 95/63  Pulse- Standing at 3 minutes 118

## 2020-02-27 NOTE — Progress Notes (Signed)
Occupational Therapy Treatment Patient Details Name: April Acevedo MRN: [id number] DOB: Feb 02, 1976 Today's Date: 02/27/2020    History of present illness  April Acevedo  is a 44 y.o. female, with history of atrial fibrillation and breast cancer -currently still taking Aromasin and Lupron, presents to the ED with a chief complaint of syncope.Etiology of syncope is uncelar.  Some concern for seizure, HA.  Multiple head scans clear including MRI CT venogram, CT and CTA.     OT comments  Pt making graudal progress towards OT goals this session. Pt lethargic upon OTA arrival but reports needing to void bladder and agreeable to OT intervention. Pt required MINA to sit<>stand from EOB with RW.  Pt ambulated ~ 3 ft then reports dizziness; deferred further mobility and pulled up BSC. Pt talking to therapist while on [facility] and was able to complete pericare with minguard for safety, however while getting pt back to bed pt become unresponsive with HR 44 bpm. Once pt safely in supine BP checked 106/76- RN entering to assess pt. Feel pt likely experiencing syncopal episode? Pt left in supine with RN present. Will continue to follow pt and update DC recs as needed.    Follow Up Recommendations  Outpatient OT    Equipment Recommendations  Tub/shower seat    Recommendations for Other Services      Precautions / Restrictions Precautions Precautions: Fall Restrictions Weight Bearing Restrictions: No       Mobility Bed Mobility Overal bed mobility: Needs Assistance Bed Mobility: Supine to Sit;Sit to Supine     Supine to sit: Min assist Sit to supine: Mod assist   General bed mobility comments: pt required light MINA  to elevate trunk into sitting, MOD A to return to supine d/t pt becoming unresponsive  Transfers Overall transfer level: Needs assistance Equipment used: Rolling walker (2 wheeled) Transfers: Sit to/from Stand Sit to Stand: Min assist         General transfer comment:  MINA for balance d/t unsteadiness secondary to reports of dizziness    Balance Overall balance assessment: Needs assistance Sitting-balance support: Bilateral upper extremity supported;Feet supported Sitting balance-Leahy Scale: Poor     Standing balance support: Bilateral upper extremity supported Standing balance-Leahy Scale: Poor Standing balance comment: reliance on external support and BUE support                           ADL either performed or assessed with clinical judgement   ADL Overall ADL's : Needs assistance/impaired                     Lower Body Dressing: Moderate assistance;Sit to/from stand Lower Body Dressing Details (indicate cue type and reason): MOD A to thread BLES into brief and MOD A to pull pants up to waist line. pt assisting with task as able but required more assist d/t dizziness Toilet Transfer: Moderate assistance;Minimal assistance;RW;Ambulation;BSC Toilet Transfer Details (indicate cue type and reason): deferred ambulation into BR d/t dizziness, MIN- MOD A secondary to balance and dizziness to pivot to [facility] Toileting- Clothing Manipulation and Hygiene: Supervision/safety;Set up;Sitting/lateral lean;Min guard Toileting - Clothing Manipulation Details (indicate cue type and reason): pt was able to complete anterior pericare via lateral leans with min guard for balance d/t dizziness     Functional mobility during ADLs: Minimal assistance;Moderate assistance;Rolling walker General ADL Comments: pt limited by dizziness this session with pt experiencing syncopal episode this session needing up to MOD A for  safety as pt HR decrease to 44 bpm and pt became unresponsive     Vision Baseline Vision/History: No visual deficits     Perception     Praxis      Cognition Arousal/Alertness: Awake/alert;Lethargic;Suspect due to medications (pt initally lethargic upon arrival but became more alert. pt reports lethargy likely d/y meds however by  end of sesson pt lethargic again) Behavior During Therapy: [facility] for tasks assessed/performed;Flat affect (became flat towards end of session) Overall Cognitive Status: Difficult to assess                                 General Comments: overall WFL but difficult to assess d/t level of arousal        Exercises     Shoulder Instructions       General Comments pts huband present during session, helpful and supportive. Pt with c/o of dizziness during mobility, deferred ambulation into BR secondary to dizziness. pt started to become unresponsive towards end of session with HR 44 bpm BP 106/76    Pertinent Vitals/ Pain       Pain Assessment: Faces Faces Pain Scale: Hurts a little bit Pain Location: head Pain Descriptors / Indicators: Headache Pain Intervention(s): Monitored during session  Home Living                                          Prior Functioning/Environment              Frequency  Min 2X/week        Progress Toward Goals  OT Goals(current goals can now be found in the care plan section)  Progress towards OT goals: Progressing toward goals  Acute Rehab OT Goals Patient Stated Goal: to reduce dizziness OT Goal Formulation: With patient/family Time For Goal Achievement: 03/05/20 Potential to Achieve Goals: Good  Plan Discharge plan remains appropriate;Frequency remains appropriate    Co-evaluation                 AM-PAC OT "6 Clicks" Daily Activity     Outcome Measure   Help from another person eating meals?: A Little Help from another person taking care of personal grooming?: A Little Help from another person toileting, which includes using toliet, bedpan, or urinal?: A Lot Help from another person bathing (including washing, rinsing, drying)?: A Lot Help from another person to put on and taking off regular upper body clothing?: A Little Help from another person to put on and taking off regular lower body  clothing?: A Lot 6 Click Score: 15    End of Session Equipment Utilized During Treatment: Gait belt;Rolling walker;Other (comment) (BSC)  OT Visit Diagnosis: Unsteadiness on feet (R26.81);Muscle weakness (generalized) (M62.81)   Activity Tolerance Treatment limited secondary to medical complications (Comment);Other (comment) (syncope)   Patient Left in bed;with call bell/phone within reach;with family/visitor present;with nursing/sitter in room   Nurse Communication Mobility status;Other (comment) (syncopal episode)        Time: 1001-1029 OT Time Calculation (min): 28 min  Charges: OT General Charges $OT Visit: 1 Visit OT Treatments $Self Care/Home Management : 23-37 mins  Lanier Clam., COTA/L Acute Rehabilitation Services [phone]    Ihor Gully 02/27/2020, 10:55 AM

## 2020-02-27 NOTE — Progress Notes (Signed)
PT Cancellation Note  Patient Details Name: April Acevedo MRN: [id number] DOB: 09-20-1975   Cancelled Treatment:    Reason Eval/Treat Not Completed: Other (comment);Medical issues which prohibited therapy. Noted events from OT session with MD orders for pt to lie flat and rest for awhile. Will follow up as able later today vs another date. Acute PT to continue during pt's hospital stay.  Willow Ora, PTA, CLT Acute Rehab Services Office780 [phone] 02/27/20, 11:28 AM   Willow Ora 02/27/2020, 11:27 AM

## 2020-02-28 DIAGNOSIS — K5900 Constipation, unspecified: Secondary | ICD-10-CM | POA: Diagnosis not present

## 2020-02-28 DIAGNOSIS — I4891 Unspecified atrial fibrillation: Secondary | ICD-10-CM | POA: Diagnosis not present

## 2020-02-28 DIAGNOSIS — R4182 Altered mental status, unspecified: Secondary | ICD-10-CM | POA: Diagnosis not present

## 2020-02-28 DIAGNOSIS — G4489 Other headache syndrome: Secondary | ICD-10-CM | POA: Diagnosis not present

## 2020-02-28 DIAGNOSIS — R55 Syncope and collapse: Secondary | ICD-10-CM | POA: Diagnosis not present

## 2020-02-28 LAB — CBC WITH DIFFERENTIAL/PLATELET
Abs Immature Granulocytes: 0.03 10*3/uL (ref 0.00–0.07)
Basophils Absolute: 0 10*3/uL (ref 0.0–0.1)
Basophils Relative: 0 %
Eosinophils Absolute: 0.1 10*3/uL (ref 0.0–0.5)
Eosinophils Relative: 2 %
HCT: 40.3 % (ref 36.0–46.0)
Hemoglobin: 12.9 g/dL (ref 12.0–15.0)
Immature Granulocytes: 1 %
Lymphocytes Relative: 44 %
Lymphs Abs: 2.4 10*3/uL (ref 0.7–4.0)
MCH: 26.4 pg (ref 26.0–34.0)
MCHC: 32 g/dL (ref 30.0–36.0)
MCV: 82.6 fL (ref 80.0–100.0)
Monocytes Absolute: 0.4 10*3/uL (ref 0.1–1.0)
Monocytes Relative: 7 %
Neutro Abs: 2.5 10*3/uL (ref 1.7–7.7)
Neutrophils Relative %: 46 %
Platelets: 178 10*3/uL (ref 150–400)
RBC: 4.88 MIL/uL (ref 3.87–5.11)
RDW: 13.2 % (ref 11.5–15.5)
WBC: 5.5 10*3/uL (ref 4.0–10.5)
nRBC: 0 % (ref 0.0–0.2)

## 2020-02-28 LAB — COMPREHENSIVE METABOLIC PANEL
ALT: 27 U/L (ref 0–44)
AST: 19 U/L (ref 15–41)
Albumin: 3.3 g/dL — ABNORMAL LOW (ref 3.5–5.0)
Alkaline Phosphatase: 45 U/L (ref 38–126)
Anion gap: 6 (ref 5–15)
BUN: 21 mg/dL — ABNORMAL HIGH (ref 6–20)
CO2: 23 mmol/L (ref 22–32)
Calcium: 9.3 mg/dL (ref 8.9–10.3)
Chloride: 108 mmol/L (ref 98–111)
Creatinine, Ser: 0.99 mg/dL (ref 0.44–1.00)
GFR, Estimated: >60 mL/min (ref >60)
Glucose, Bld: 94 mg/dL (ref 70–99)
Potassium: 3.8 mmol/L (ref 3.5–5.1)
Sodium: 137 mmol/L (ref 135–145)
Total Bilirubin: 0.5 mg/dL (ref 0.3–1.2)
Total Protein: 7.3 g/dL (ref 6.5–8.1)

## 2020-02-28 LAB — MAGNESIUM: Magnesium: 2.1 mg/dL (ref 1.7–2.4)

## 2020-02-28 MED ORDER — LACTATED RINGERS IV SOLN: INTRAVENOUS | Status: DC

## 2020-02-28 MED ORDER — LACTATED RINGERS IV SOLN: INTRAVENOUS | Status: AC

## 2020-02-28 NOTE — Progress Notes (Signed)
Subjective: Headache is 4/10   Exam: Vitals:   02/28/20 0851 02/28/20 1207  BP: 94/60 96/65  Pulse: 64 68  Resp: 20 18  Temp: 97.7 F (36.5 C) 98.1 F (36.7 C)  SpO2: 99% 100%   Gen: In bed, NAD Resp: non-labored breathing, no acute distress Abd: soft, nt  Neuro: MS: Drowsy but arousable, follows commands readily once awakened. CN: Visual fields full, EOMI Motor: Moves all extremities well  Orthostatic vital signs: No drop in BP, but her pulse does increase from 61 while sitting to over 100 while standing.  Impression: 44 year old female with orthostatic syncope.  She also has had headaches, which are improved, she will need outpatient follow up.   Of note, trazodone is associated with up to 10% incidence of orthostatic hypotension, therefore I have discontinued this.  Recommendations: 1) discontinue trazodone 2) continue topiramate 3) management of orthostasis per IM 4) Follow up as outpatient with neurology, no further inpatient recommendations at this time.   Roland Rack, MD [facility] [phone]  If 7pm- 7am, please page neurology on call as listed in [place].

## 2020-02-28 NOTE — Progress Notes (Signed)
PROGRESS NOTE        PATIENT DETAILS Name: April Acevedo Age: 44 y.o. Sex: female Date of Birth: 04/02/1976 Admit Date: 02/18/2020 Admitting Physician Rolla Plate, DO XVQ:[id number], No Pcp Per  Brief Narrative: Patient is a 44 y.o. female PAF-not on anticoagulation, breast cancer-presented to the hospital following a syncopal episode-further hospital course complicated by peripheral vertigo and frontal headaches.  See below for further details.  Significant events: 10/16>> admit to [facility] for evaluation of syncope  Significant studies: 10/16>> CT head: No acute abnormalities 10/16>> CTA head and neck: No large vessel occlusion or stenosis or aneurysm in the head and neck. 10/17>> MRI brain: No metastatic disease acute intracranial abnormality 10/17>> Echo: EF 55-60% 10/19>> X-ray abdomen: No acute abnormality 10/21>> CT venogram head: Negative for dural venous sinus thrombosis  Antimicrobial therapy: Augmentin: 10/19>>  Microbiology data: None  Procedures : 10/18>> EEG: No seizure activity.  Consults: Neurology  DVT Prophylaxis : Place TED hose Start: 02/27/20 1033 heparin injection 5,000 Units Start: 02/18/20 2345 SCD's Start: 02/18/20 2344  Subjective: Patient in bed, appears comfortable, denies any headache, no fever, no chest pain or pressure, no shortness of breath , no abdominal pain. No focal weakness.  Vertigo symptoms are gradually improving.   Assessment/Plan:  Syncope: Etiology unclear-some concern for seizures-evaluated by neurology-advised to hold off on AEDs unless patient has recurrent symptoms.  Telemetry negative for arrhythmia-echo with preserved EF, this likely could have been due to dehydration causing orthostatic hypotension, also supine pressures low, beta-blocker dose dropped, hydrate again with IV fluids on 02/28/2020 and monitor, overall improving.  EKG remained stable, no focal deficits, head CT and MRI  nonacute, discussed with neurology they will evaluate her again on 02/27/2020 as well.  Previously per neurology-no driving for 6 months with full seizure precautions.  Home health equipment ordered.  Vertigo: Vertigo appears positional-MRI brain without any acute abnormalities.  CT venogram brain with no evidence of dural venous sinus thrombosis.  Continue as needed meclizine-continued attempts at physical therapy-seems to be slowly improving-continue supportive care.  Will need outpatient vestibular therapy.  Headache: Continues to have frontal headaches-however some improvement after IV Depacon on 10/22-headaches down to 3-5/10.  Patient has no prior history of migraine headaches-but headache has some migrainous features (likes room to be quiet/dark-endorses photophobia)-on almost daily dosing of ibuprofen for arthritis for the past several months.  Suspect this may be headache related to medication overuse.  Hardly any response to a cocktail of Toradol/Reglan/Benadryl-improvement after repeat Depacon 500 mg IV & Solu-Medrol 500 mg on 02/25/2020, continue on Topamax 25 mg nightly for maintenance.  Has finished empiric treatment for Augmentin which was given for mastoid effusion seen on imaging studies-last day on 10/24.  CT venogram brain negative for dural venous sinus thrombosis-MRI brain negative for structural abnormalities.    PAF: Remains in sinus rhythm-Per prior documentation-not on anticoagulation due to interaction with her cancer medications.  Beta-blocker dose reduced further on 02/27/2020 due to orthostatic hypotension.  History of breast cancer-s/p double mastectomy 4 years back in [place]: Maintained on Aromasin and Lupron-we will need to establish with outpatient oncology as patient just moved into this area.  Apparently has not taken Lupron in the past several months.  [facility].  Insomnia: Continue trazodone-added as needed Ambien-claims she finally slept well  last night.  3  mm left upper lung nodule: Stable for outpatient follow-up with pulmonary.  2.7 cm left thyroid nodule: Stable for outpatient follow-up with endocrine-TSH within normal limits.  Abnormal appearance of salivary gland and imaging studies: No clinical features consistent with Sjogren's symptoms-doubt further work-up required while inpatient, PCP to arrange for outpatient rheumatology follow-up.   Diet: Diet Order            Diet regular Room service appropriate? Yes; Fluid consistency: Thin  Diet effective now                  Code Status: Full code   Family Communication: Spouse at bedside 02/26/20, 02/27/20  Disposition Plan: Status is: Inpatient  Remains inpatient appropriate because:Inpatient level of care appropriate due to severity of illness   Dispo: The patient is from: Home              Anticipated d/c is to: Home              Anticipated d/c date is: 1 day              Patient currently is not medically stable to d/c.  Barriers to Discharge: Ongoing severe vertigo-headache-needing inpatient therapies-see above.  Antimicrobial agents: Anti-infectives (From admission, onward)   Start     Dose/Rate Route Frequency Ordered Stop   02/21/20 1345  amoxicillin-clavulanate (AUGMENTIN) 875-125 MG per tablet 1 tablet        1 tablet Oral Every 12 hours 02/21/20 1258 02/25/20 2306       Time spent: 25- minutes-Greater than 50% of this time was spent in counseling, explanation of diagnosis, planning of further management, and coordination of care.  MEDICATIONS: Scheduled Meds: . busPIRone  7.5 mg Oral Daily  . exemestane  25 mg Oral QPC breakfast  . heparin  5,000 Units Subcutaneous Q8H  . polyethylene glycol  17 g Oral BID  . propranolol  10 mg Oral Daily  . senna  1 tablet Oral QHS  . topiramate  50 mg Oral QHS   Continuous Infusions: . lactated ringers     PRN Meds:.acetaminophen **OR** [DISCONTINUED] acetaminophen (TYLENOL) oral liquid 160  mg/5 mL **OR** [DISCONTINUED] acetaminophen, bisacodyl, ketorolac, magic mouthwash, meclizine, ondansetron (ZOFRAN) IV, SUMAtriptan, zolpidem   PHYSICAL EXAM: Vital signs: Vitals:   02/27/20 2031 02/28/20 0044 02/28/20 0423 02/28/20 0851  BP: 110/73 (!) 90/56 (!) 93/56 94/60  Pulse: 78 70 (!) 56 64  Resp: 18 16 14 20   Temp: 98.7 F (37.1 C) 98 F (36.7 C) (!) 97.5 F (36.4 C) 97.7 F (36.5 C)  TempSrc: Oral Oral Oral Oral  SpO2: 98% 97% 97% 99%  Weight:      Height:       Filed Weights   02/18/20 1826  Weight: 54.4 kg   Body mass index is 19.97 kg/m.   Gen Exam:  Awake Alert, No new F.N deficits, Flat affect .AT,PERRAL Supple Neck,No JVD, No cervical lymphadenopathy appriciated.  Symmetrical Chest wall movement, Good air movement bilaterally, CTAB RRR,No Gallops, Rubs or new Murmurs, No Parasternal Heave +ve B.Sounds, Abd Soft, No tenderness, No organomegaly appriciated, No rebound - guarding or rigidity. No Cyanosis, Clubbing or edema, No new Rash or bruise    I have personally reviewed following labs and imaging studies  LABORATORY DATA: CBC: Recent Labs  Lab 02/23/20 0217 02/24/20 0138 02/25/20 0201 02/28/20 0504  WBC 3.2* 4.9 5.0 5.5  NEUTROABS  --   --   --  2.5  HGB 11.4*  12.8 12.6 12.9  HCT 35.9* 39.5 38.9 40.3  MCV 83.9 82.8 82.1 82.6  PLT 151 182 173 798    Basic Metabolic Panel: Recent Labs  Lab 02/23/20 0217 02/24/20 0138 02/25/20 0201 02/28/20 0504  NA 141 138 139 137  K 3.8 3.9 3.7 3.8  CL 112* 105 106 108  CO2 23 23 25 23   GLUCOSE 93 128* 105* 94  BUN 15 13 17  21*  CREATININE 0.90 0.91 0.93 0.99  CALCIUM 8.7* 9.5 9.0 9.3  MG  --   --   --  2.1    GFR: Estimated Creatinine Clearance: 61.6 mL/min (by C-G formula based on SCr of 0.99 mg/dL).  Liver Function Tests: Recent Labs  Lab 02/23/20 0217 02/25/20 0201 02/28/20 0504  AST 25 40 19  ALT 19 35 27  ALKPHOS 40 41 45  BILITOT 0.3 0.4 0.5  PROT 6.9 7.7 7.3  ALBUMIN  3.0* 3.3* 3.3*   No results for input(s): LIPASE, AMYLASE in the last 168 hours. No results for input(s): AMMONIA in the last 168 hours.  Coagulation Profile: No results for input(s): INR, PROTIME in the last 168 hours.  Cardiac Enzymes: No results for input(s): CKTOTAL, CKMB, CKMBINDEX, TROPONINI in the last 168 hours.  BNP (last 3 results) No results for input(s): PROBNP in the last 8760 hours.  Lipid Profile: No results for input(s): CHOL, HDL, LDLCALC, TRIG, CHOLHDL, LDLDIRECT in the last 72 hours.  Thyroid Function Tests: No results for input(s): TSH, T4TOTAL, FREET4, T3FREE, THYROIDAB in the last 72 hours.  Anemia Panel: No results for input(s): VITAMINB12, FOLATE, FERRITIN, TIBC, IRON, RETICCTPCT in the last 72 hours.  Urine analysis: No results found for: COLORURINE, APPEARANCEUR, LABSPEC, PHURINE, GLUCOSEU, HGBUR, BILIRUBINUR, KETONESUR, PROTEINUR, UROBILINOGEN, NITRITE, LEUKOCYTESUR  Sepsis Labs: Lactic Acid, Venous No results found for: LATICACIDVEN  MICROBIOLOGY: Recent Results (from the past 240 hour(s))  Respiratory Panel by RT PCR (Flu A&B, Covid) - Nasopharyngeal Swab     Status: None   Collection Time: 02/18/20  7:33 PM   Specimen: Nasopharyngeal Swab  Result Value Ref Range Status   SARS Coronavirus 2 by RT PCR NEGATIVE NEGATIVE Final    Comment: (NOTE) SARS-CoV-2 target nucleic acids are NOT DETECTED.  The SARS-CoV-2 RNA is generally detectable in upper respiratoy specimens during the acute phase of infection. The lowest concentration of SARS-CoV-2 viral copies this assay can detect is 131 copies/mL. A negative result does not preclude SARS-Cov-2 infection and should not be used as the sole basis for treatment or other patient management decisions. A negative result may occur with  improper specimen collection/handling, submission of specimen other than nasopharyngeal swab, presence of viral mutation(s) within the areas targeted by this assay, and  inadequate number of viral copies (<131 copies/mL). A negative result must be combined with clinical observations, patient history, and epidemiological information. The expected result is Negative.  Fact Sheet for Patients:  PinkCheek.be  Fact Sheet for Healthcare Providers:  GravelBags.it  This test is no t yet approved or cleared by the [place] FDA and  has been authorized for detection and/or diagnosis of SARS-CoV-2 by FDA under an Emergency Use Authorization (EUA). This EUA will remain  in effect (meaning this test can be used) for the duration of the COVID-19 declaration under Section 564(b)(1) of the Act, 21 U.S.C. section 360bbb-3(b)(1), unless the authorization is terminated or revoked sooner.     Influenza A by PCR NEGATIVE NEGATIVE Final   Influenza B by PCR NEGATIVE NEGATIVE  Final    Comment: (NOTE) The Xpert Xpress SARS-CoV-2/FLU/RSV assay is intended as an aid in  the diagnosis of influenza from Nasopharyngeal swab specimens and  should not be used as a sole basis for treatment. Nasal washings and  aspirates are unacceptable for Xpert Xpress SARS-CoV-2/FLU/RSV  testing.  Fact Sheet for Patients: PinkCheek.be  Fact Sheet for Healthcare Providers: GravelBags.it  This test is not yet approved or cleared by the [place] FDA and  has been authorized for detection and/or diagnosis of SARS-CoV-2 by  FDA under an Emergency Use Authorization (EUA). This EUA will remain  in effect (meaning this test can be used) for the duration of the  Covid-19 declaration under Section 564(b)(1) of the Act, 21  U.S.C. section 360bbb-3(b)(1), unless the authorization is  terminated or revoked. Performed at [facility], [address], [place], [place] [address]     RADIOLOGY STUDIES/RESULTS: No results found.   LOS: 10 days   Signature  Lala Lund  M.D on 02/28/2020 at 9:33 AM   -  To page go to [email or web address]

## 2020-02-28 NOTE — Telephone Encounter (Signed)
Spoke with patients husband about scheduling an appointment. He let me know she is still in the hospital. I advised him of the appointment we have available and to call back if she is discharged to schedule.

## 2020-02-28 NOTE — Telephone Encounter (Signed)
Noted thanks °

## 2020-02-28 NOTE — Progress Notes (Signed)
Physical Therapy Treatment Patient Details Name: April Acevedo MRN: [id number] DOB: November 06, 1975 Today's Date: 02/28/2020    History of Present Illness  April Acevedo  is a 44 y.o. female, with history of atrial fibrillation and breast cancer -currently still taking Aromasin and Lupron, presents to the ED with a chief complaint of syncope.Etiology of syncope is uncelar.  Some concern for seizure, HA.  Multiple head scans clear including MRI CT venogram, CT and CTA.      PT Comments    Patient progressing slowly towards PT goals. Tolerating sitting up in chair for a few hours today. Continues to report feeling dizzy. Tolerated short distance ambulation with Min guard assist and use of RW for support. Cues for segmental turning and gaze stabilization. Performed 5xSTS in 33.28 secs (norm value for her age is 7.6 sec +/- 1.8 sec) indicating decreased functional strength, impaired balance and fall risk.  Reports she is feeling more like herself today cognitively and husband agrees. HR ranged from 77-120 bpm with activity. Will continue to follow.   Follow Up Recommendations  Home health PT (request vestibular PT)     Equipment Recommendations  Rolling walker with 5" wheels;3in1 (PT)    Recommendations for Other Services       Precautions / Restrictions Precautions Precautions: Fall;Other (comment) Precaution Comments: vertigo Restrictions Weight Bearing Restrictions: No    Mobility  Bed Mobility               General bed mobility comments: Up in chair upon PT arrival.  Transfers Overall transfer level: Needs assistance Equipment used: Rolling walker (2 wheeled) Transfers: Sit to/from Stand Sit to Stand: Min guard         General transfer comment: Min guard for safety. Stood from [employer], from toilet x1. Reports feeling constantly dizzy.  Ambulation/Gait Ambulation/Gait assistance: Min guard Gait Distance (Feet): 22 Feet (2 bouts) Assistive device: Rolling  walker (2 wheeled) Gait Pattern/deviations: Step-through pattern;Narrow base of support Gait velocity: reduced Gait velocity interpretation: <1.31 ft/sec, indicative of household ambulator General Gait Details: Very slow, steady and guarded gait with RW for support. Using segmental turning.   Stairs             Wheelchair Mobility    Modified Rankin (Stroke Patients Only)       Balance Overall balance assessment: Needs assistance Sitting-balance support: Feet supported;No upper extremity supported Sitting balance-Leahy Scale: Fair Sitting balance - Comments: Fatigues sitting in chair without back support.   Standing balance support: During functional activity Standing balance-Leahy Scale: Fair Standing balance comment: Able to stand at sink and wash hands with Min guard for safety. Uses BUE support for walking.                            Cognition Arousal/Alertness: Awake/alert Behavior During Therapy: WFL for tasks assessed/performed Overall Cognitive Status: Within Functional Limits for tasks assessed                                 General Comments: Reports feeling more like herself today and husband in room agrees. Able to state conversations she had with MD earlier.      Exercises      General Comments General comments (skin integrity, edema, etc.): Husband present and supportive. HR ranged from 77-120 bpm with activity. Performed 5xSTS in 33.28 secs (norm value for her age is 7.6 sec +/-  1.8 sec) indicating decreased functional strength, impaired balance and fall risk.      Pertinent Vitals/Pain Pain Assessment: No/denies pain    Home Living                      Prior Function            PT Goals (current goals can now be found in the care plan section) Progress towards PT goals: Progressing toward goals (slowly)    Frequency    Min 4X/week      PT Plan Current plan remains appropriate    Co-evaluation               AM-PAC PT "6 Clicks" Mobility   Outcome Measure  Help needed turning from your back to your side while in a flat bed without using bedrails?: None Help needed moving from lying on your back to sitting on the side of a flat bed without using bedrails?: None Help needed moving to and from a bed to a chair (including a wheelchair)?: None Help needed standing up from a chair using your arms (e.g., wheelchair or bedside chair)?: None Help needed to walk in hospital room?: A Little Help needed climbing 3-5 steps with a railing? : A Little 6 Click Score: 22    End of Session Equipment Utilized During Treatment: Gait belt Activity Tolerance: Patient limited by fatigue Patient left: in chair;with call bell/phone within reach;with chair alarm set;with family/visitor present Nurse Communication: Mobility status PT Visit Diagnosis: Other abnormalities of gait and mobility (R26.89);Other symptoms and signs involving the nervous system (R29.898);Dizziness and giddiness (R42)     Time: 1411-1440 PT Time Calculation (min) (ACUTE ONLY): 29 min  Charges:  $Gait Training: 8-22 mins $Therapeutic Activity: 8-22 mins                     Marisa Severin, PT, DPT Acute Rehabilitation Services Pager [phone] Office [place] 02/28/2020, 3:25 PM

## 2020-02-29 MED ORDER — SUMATRIPTAN SUCCINATE 50 MG PO TABS: 50.0000 mg | ORAL_TABLET | Freq: Every day | ORAL | 2 refills | Status: DC | PRN

## 2020-02-29 MED ORDER — LACTATED RINGERS IV BOLUS
500.0000 mL | Freq: Once | INTRAVENOUS | Status: AC
  Administered 2020-02-29: 500 mL via INTRAVENOUS

## 2020-02-29 MED ORDER — PROPRANOLOL HCL 10 MG PO TABS
10.0000 mg | ORAL_TABLET | Freq: Every day | ORAL | 0 refills | Status: DC
Start: 2020-03-01 — End: 2021-08-06

## 2020-02-29 MED ORDER — TOPIRAMATE 50 MG PO TABS
50.0000 mg | ORAL_TABLET | Freq: Every day | ORAL | 0 refills | Status: DC
Start: 2020-02-29 — End: 2021-08-06

## 2020-02-29 MED ORDER — MECLIZINE HCL 25 MG PO TABS: 25.0000 mg | ORAL_TABLET | Freq: Three times a day (TID) | ORAL | 0 refills | Status: DC | PRN

## 2020-02-29 NOTE — TOC Transition Note (Signed)
Transition of Care [facility]) - CM/SW Discharge Note   Patient Details  Name: Sherie Dobrowolski MRN: [id number] Date of Birth: 1976/01/03  Transition of [facility]) CM/SW Contact:  Pollie Friar, RN Phone Number: 02/29/2020, 11:10 AM   Clinical Narrative:    Pt discharging home with outpatient therapy. Information is on the AVS. Pt has PCP appt on AVS as well.  DME for home has been delivered to the room. Pt has transportation home.   Final next level of care: OP Rehab Barriers to Discharge: No Barriers Identified   Patient Goals and CMS Choice     Choice offered to / list presented to : Patient  Discharge Placement                       Discharge Plan and Services                                     Social Determinants of Health (SDOH) Interventions     Readmission Risk Interventions No flowsheet data found.

## 2020-02-29 NOTE — Progress Notes (Signed)
Occupational Therapy Treatment Patient Details Name: April Acevedo MRN: [id number] DOB: Mar 08, 1976 Today's Date: 02/29/2020    History of present illness  April Acevedo  is a 44 y.o. female, with history of atrial fibrillation and breast cancer -currently still taking Aromasin and Lupron, presents to the ED with a chief complaint of syncope.Etiology of syncope is uncelar.  Some concern for seizure, HA.  Multiple head scans clear including MRI CT venogram, CT and CTA.     OT comments  Pt making good progress this session in comparison to previous session. Pt with no c/o of dizziness during mobility. Session focus on functional transfer training in prep for likely DC home. Pt required MIN A for walkin shower transfer to 3n1 with grab bars. Education provided on placement of grab bars and various types to facilitate safe functional transfers. Pt would continue to benefit from skilled occupational therapy while admitted and after d/c to address the below listed limitations in order to improve overall functional mobility and facilitate independence with BADL participation. DC plan remains appropriate, will follow acutely per POC.    Follow Up Recommendations  Outpatient OT    Equipment Recommendations  Tub/shower seat    Recommendations for Other Services      Precautions / Restrictions Precautions Precautions: Fall;Other (comment) Restrictions Weight Bearing Restrictions: No       Mobility Bed Mobility               General bed mobility comments: pt OOB in chair upon arrival  Transfers Overall transfer level: Needs assistance Equipment used: Rolling walker (2 wheeled) Transfers: Sit to/from Stand Sit to Stand: Min guard         General transfer comment: minguard for safety, cues for hand placement when sit<>stand from 3n1 druring shower transfer    Balance Overall balance assessment: Needs assistance Sitting-balance support: Feet supported;No upper extremity  supported Sitting balance-Leahy Scale: Fair     Standing balance support: During functional activity Standing balance-Leahy Scale: Fair                             ADL either performed or assessed with clinical judgement   ADL Overall ADL's : Needs assistance/impaired                         Toilet Transfer: Min Marine scientist Details (indicate cue type and reason): simulated via functionalmobility with RW and min guard assist     Tub/ Shower Transfer: Minimal assistance;Walk-in shower;3 in 1;Rolling walker;Grab bars Tub/Shower Transfer Details (indicate cue type and reason): worked on shower transfer to [employer] with pts husband.pt required MIN A for safety and balance with RW. cues for RW mgmt and how to incorporate using grab bars for steadying assist. Pt and husband with questions about where to place grab bars. Functional mobility during ADLs: Min guard;Rolling walker General ADL Comments: pt dizziness greatly improved this session with no compliants during mobility     Vision Baseline Vision/History: No visual deficits     Perception     Praxis      Cognition Arousal/Alertness: Awake/alert Behavior During Therapy: WFL for tasks assessed/performed Overall Cognitive Status: Within Functional Limits for tasks assessed  Exercises     Shoulder Instructions       General Comments Husband present and supportive. HR WNL 101 bpm max with mobility. education provided on obtaining pulse ox at home mostly to check HR as pt reprots feeling fatigue earlier and checking her heart monitior which read 127 bpm. issued pt gait belt at home. pt with questions about shower DME, provided education and deferred questions related to insurance to Sw/CM. Education provided on types of grab bars to be used in shower and where to place them.    Pertinent Vitals/ Pain       Pain  Assessment: No/denies pain  Home Living                                          Prior Functioning/Environment              Frequency  Min 2X/week        Progress Toward Goals  OT Goals(current goals can now be found in the care plan section)  Progress towards OT goals: Progressing toward goals  Acute Rehab OT Goals Patient Stated Goal: to reduce dizziness OT Goal Formulation: With patient/family Time For Goal Achievement: 03/05/20 Potential to Achieve Goals: Good  Plan Discharge plan remains appropriate;Frequency remains appropriate    Co-evaluation                 AM-PAC OT "6 Clicks" Daily Activity     Outcome Measure   Help from another person eating meals?: None Help from another person taking care of personal grooming?: A Little Help from another person toileting, which includes using toliet, bedpan, or urinal?: A Little Help from another person bathing (including washing, rinsing, drying)?: A Little Help from another person to put on and taking off regular upper body clothing?: None Help from another person to put on and taking off regular lower body clothing?: None 6 Click Score: 21    End of Session Equipment Utilized During Treatment: Gait belt;Rolling walker;Other (comment) (3n1 as shower seat)  OT Visit Diagnosis: Unsteadiness on feet (R26.81);Muscle weakness (generalized) (M62.81)   Activity Tolerance Patient tolerated treatment well   Patient Left in chair;with call bell/phone within reach;with family/visitor present   Nurse Communication Mobility status        Time: 3291-9166 OT Time Calculation (min): 24 min  Charges: OT General Charges $OT Visit: 1 Visit OT Treatments $Self Care/Home Management : 23-37 mins  Lanier Clam., COTA/L Acute Rehabilitation Services [phone] [place] 02/29/2020, 11:01 AM

## 2020-02-29 NOTE — Discharge Instructions (Signed)
Do not drive, operate heavy machinery, perform activities at heights, swimming or participation in water activities or provide baby sitting services if your were admitted for syncope or siezures until Neurologist and advised to do so again.  Follow with Primary MD and recommended endocrinologist, lung doctor and neurologist in 7 days   Get CBC, CMP, 2 view Chest X ray -  checked next visit within 1 week by Primary MD   Activity: Wear TED stockings thigh-high throughout the daytime, you can take it off at night, you must sit at a stationary position for 5 minutes and do leg extension exercises as taught for 5 minutes before you start walking from a resting position or after getting up from a bed, once you stand up , stand at that spot for 3-5 minutes while holding on to a wall-bed-heavy furniture and then walk only if you are not dizzy, using a  walker at all times, if you still get dizzy sit down, and call for help, with full fall precautions use walker/cane & assistance as needed  Disposition Home    Diet: Heart Healthy    Special Instructions: If you have smoked or chewed Tobacco  in the last 2 yrs please stop smoking, stop any regular Alcohol  and or any Recreational drug use.  On your next visit with your primary care physician please Get Medicines reviewed and adjusted.  Please request your Prim.MD to go over all Hospital Tests and Procedure/Radiological results at the follow up, please get all Hospital records sent to your Prim MD by signing hospital release before you go home.  If you experience worsening of your admission symptoms, develop shortness of breath, life threatening emergency, suicidal or homicidal thoughts you must seek medical attention immediately by calling 911 or calling your MD immediately  if symptoms less severe.  You Must read complete instructions/literature along with all the possible adverse reactions/side effects for all the Medicines you take and that have been  prescribed to you. Take any new Medicines after you have completely understood and accpet all the possible adverse reactions/side effects.   Do not drive when taking Pain medications.  Do not take more than prescribed Pain, Sleep and Anxiety Medications

## 2020-02-29 NOTE — Discharge Summary (Addendum)
April Acevedo EXN:[id number] DOB: [birth year] DOA: 02/18/2020  PCP: Patient, No Pcp Per  Admit date: 02/18/2020  Discharge date: 02/29/2020  Admitted From: Home   Disposition:  Home   Recommendations for Outpatient Follow-up:   Follow up with PCP in 1-2 weeks  PCP Please obtain BMP/CBC, 2 view CXR in 1week,  (see Discharge instructions)   PCP Please follow up on the following pending results: Needs outpatient follow-up with neurology, oncology, pulmonary, endocrine.  Monitor orthostatics closely.  Review imaging results in detail.   Home Health: Patient preferred outpatient PT which has been ordered Equipment/Devices: Kindly see below Consultations: Neurology Discharge Condition: Stable    CODE STATUS: Full   Diet Recommendation: Heart Healthy    Diet Order            Diet - low sodium heart healthy           Diet regular Room service appropriate? Yes; Fluid consistency: Thin  Diet effective now                  Chief Complaint  Patient presents with  . Loss of Consciousness     Brief history of present illness from the day of admission and additional interim summary    Brief Narrative: Patient is a 44 y.o. female PAF-not on anticoagulation, breast cancer-presented to the hospital following a syncopal episode-further hospital course complicated by peripheral vertigo and frontal headaches.  See below for further details.  Significant events: 10/16>> admit to [facility] for evaluation of syncope  Significant studies: 10/16>> CT head: No acute abnormalities 10/16>> CTA head and neck: No large vessel occlusion or stenosis or aneurysm in the head and neck. 10/17>> MRI brain: No metastatic disease acute intracranial abnormality 10/17>> Echo: EF 55-60% 10/19>> X-ray abdomen: No acute  abnormality 10/21>> CT venogram head: Negative for dural venous sinus thrombosis                                                                 Hospital Course     Syncope: Etiology unclear-some concern for seizures-evaluated by neurology-advised to hold off on AEDs unless patient has recurrent symptoms.  Telemetry negative for arrhythmia-echo with preserved EF, this likely could have been due to dehydration causing orthostatic hypotension, also supine pressures low, beta-blocker dose dropped, adequate hydration with IV fluids and TED stockings blood pressure is much improved, she is today symptom-free.  Will be discharged home.  Beta-blocker dose dropped.  EKG remained stable, no focal deficits, head CT and MRI nonacute, discussed with neurology they will evaluate her again on 02/27/2020 as well.  Previously per neurology-no driving for 6 months with full seizure precautions.  Home health equipment ordered.  Continue outpatient neurology follow-up.  PCP to monitor blood pressure and orthostatics.  Vertigo: Vertigo appears positional-MRI brain without  any acute abnormalities.  CT venogram brain with no evidence of dural venous sinus thrombosis.  Continue as needed meclizine-continued attempts at physical therapy-seems to be slowly improving-continue supportive care.  Will need outpatient vestibular therapy per CM.  Headache:  Much improved after 2 doses of IV Depacon and Solu-Medrol followed by as needed NSAIDs and Imitrex , nightly Topamax, CT brain, MRI MRV brain, EEG nonacute.  Seen by neurology Case discussed with Dr. Leonel Ramsay.  Will require outpatient follow-up with neurology as well.  PAF: Remains in sinus rhythm-Per prior documentation-not on anticoagulation due to interaction with her cancer medications.  Beta-blocker dose reduced further on 02/27/2020 due to orthostatic hypotension.  Defer PCP to consider outpatient anticoagulation if desired.  Very high fall risk at this time.  Low  Mali vas 2 score.  History of breast cancer-s/p double mastectomy 4 years back in [place]: Maintained on Aromasin and Lupron-we will need to establish with outpatient oncology as patient just moved into this area.  Apparently has not taken Lupron in the past several months.  [facility] has [facility].  Insomnia: Continue trazodone-added as needed Ambien-claims she finally slept well last night.  3 mm left upper lung nodule: Stable for outpatient follow-up with pulmonary.  2.7 cm left thyroid nodule: Stable for outpatient follow-up with endocrine-TSH within normal limits.  Abnormal appearance of salivary gland and imaging studies: No clinical features consistent with Sjogren's symptoms-doubt further work-up required while inpatient, PCP to arrange for outpatient rheumatology follow-up.  Discharge diagnosis     Principal Problem:   Syncope Active Problems:   Vertigo   Acute encephalopathy   Unspecified atrial fibrillation (HCC)   Headache   Thyroid nodule greater than or equal to 1.5 cm in diameter incidentally noted on imaging study   Constipation    Discharge instructions    Discharge Instructions    Ambulatory referral to Hematology   Complete by: As directed    History of breast cancer-previously followed in [place]-needs to establish care here.   Ambulatory referral to Occupational Therapy   Complete by: As directed    Ambulatory referral to Physical Therapy   Complete by: As directed    Ambulatory referral to Physical Therapy   Complete by: As directed    Vestibular PT   Diet - low sodium heart healthy   Complete by: As directed    Discharge instructions   Complete by: As directed    Do not drive, operate heavy machinery, perform activities at heights, swimming or participation in water activities or provide baby sitting services if your were admitted for syncope or siezures until Neurologist and advised to do so again.  Follow with Primary  MD and recommended endocrinologist, lung doctor and neurologist in 7 days   Get CBC, CMP, 2 view Chest X ray -  checked next visit within 1 week by Primary MD   Activity: Wear TED stockings thigh-high throughout the daytime, you can take it off at night, you must sit at a stationary position for 5 minutes and do leg extension exercises as taught for 5 minutes before you start walking from a resting position or after getting up from a bed, once you stand up , stand at that spot for 3-5 minutes while holding on to a wall-bed-heavy furniture and then walk only if you are not dizzy, using a  walker at all times, if you still get dizzy sit down, and call for help, with full fall precautions use walker/cane &  assistance as needed  Disposition Home    Diet: Heart Healthy    Special Instructions: If you have smoked or chewed Tobacco  in the last 2 yrs please stop smoking, stop any regular Alcohol  and or any Recreational drug use.  On your next visit with your primary care physician please Get Medicines reviewed and adjusted.  Please request your Prim.MD to go over all Hospital Tests and Procedure/Radiological results at the follow up, please get all Hospital records sent to your Prim MD by signing hospital release before you go home.  If you experience worsening of your admission symptoms, develop shortness of breath, life threatening emergency, suicidal or homicidal thoughts you must seek medical attention immediately by calling 911 or calling your MD immediately  if symptoms less severe.  You Must read complete instructions/literature along with all the possible adverse reactions/side effects for all the Medicines you take and that have been prescribed to you. Take any new Medicines after you have completely understood and accpet all the possible adverse reactions/side effects.   Do not drive when taking Pain medications.  Do not take more than prescribed Pain, Sleep and Anxiety Medications       Discharge Medications   Allergies as of 02/29/2020   No Known Allergies     Medication List    STOP taking these medications   LORazepam 2 MG tablet Commonly known as: ATIVAN   traZODone 100 MG tablet Commonly known as: DESYREL     TAKE these medications   busPIRone 7.5 MG tablet Commonly known as: BUSPAR Take 7.5 mg by mouth daily.   exemestane 25 MG tablet Commonly known as: AROMASIN Take 25 mg by mouth daily after breakfast.   ibuprofen 800 MG tablet Commonly known as: ADVIL Take 800 mg by mouth every 8 (eight) hours as needed.   leuprolide 7.5 MG injection Commonly known as: LUPRON Inject 7.5 mg into the muscle every 28 (twenty-eight) days.   meclizine 25 MG tablet Commonly known as: ANTIVERT Take 1 tablet (25 mg total) by mouth 3 (three) times daily as needed for dizziness.   NONFORMULARY OR COMPOUNDED ITEM outpatient vestibular therapy.   propranolol 10 MG tablet Commonly known as: INDERAL Take 1 tablet (10 mg total) by mouth at bedtime. Start taking on: March 01, 2020 What changed:   medication strength  how much to take  when to take this   SUMAtriptan 50 MG tablet Commonly known as: Imitrex Take 1 tablet (50 mg total) by mouth daily as needed for migraine. May repeat in 2 hours if headache persists or recurs.   topiramate 50 MG tablet Commonly known as: TOPAMAX Take 1 tablet (50 mg total) by mouth at bedtime.            Durable Medical Equipment  (From admission, onward)         Start     Ordered   02/26/20 0741  For home use only DME Other see comment  Once       Comments: outpatient vestibular therapy.  Question:  Length of Need  Answer:  6 Months   02/26/20 0740   02/22/20 1141  For home use only DME 3 n 1  Once        02/22/20 1140   02/22/20 1141  For home use only DME Walker rolling  Once       Question Answer Comment  Walker: With [place]   Patient needs a walker to treat with the following  condition  Weakness      02/22/20 1140           Follow-up Information    [facility] Follow up.   Specialty: Rehabilitation Why: The outpatient rehab will contact you for the first appointment Contact information: [place] [phone] [place]       [facility] Follow up on 03/02/2020.   Why: Your appointment is at 10:00 am. Please arrive early and bring a picture ID, insurance card and current medications. Your appt is with Dr Posey Pronto. Contact information: [address] [place] [place] [phone]       Renato Shin, MD. Schedule an appointment as soon as possible for a visit in 1 week(s).   Specialty: Endocrinology Why: Thyroid nodule Contact information: [address] [place] [address] [phone]        Brand Males, MD. Schedule an appointment as soon as possible for a visit in 1 week(s).   Specialty: Pulmonary Disease Why: Left upper lung 3 mm nodule Contact information: [address] [place] [address] [phone]        Phillips Odor, MD. Schedule an appointment as soon as possible for a visit in 1 week(s).   Specialty: Neurology Why: Persistent headache and vertigo Contact information: [address] [place] [address] [phone]               Major procedures and Radiology Reports - PLEASE review detailed and final reports thoroughly  -       EEG  Result Date: 02/20/2020 Lora Havens, MD     02/20/2020  1:58 PM Patient Name: Madalyn Legner MRN: [id number] Epilepsy Attending: Lora Havens Referring Physician/Provider: Dr Dannielle Huh Date: 02/20/2020 Duration: 24.42 minutes Patient history: 60 old female who presented with an episode of syncope.  EEG to evaluate for seizures. Level of alertness: Awake, asleep AEDs during EEG study: None Technical aspects:  This EEG study was done with scalp electrodes positioned according to the 10-20 International system of electrode placement. Electrical activity was acquired at a sampling rate of 500Hz  and reviewed with a high frequency filter of 70Hz  and a low frequency filter of 1Hz . EEG data were recorded continuously and digitally stored. Description: The posterior dominant rhythm consists of 9 Hz activity of moderate voltage (25-35 uV) seen predominantly in posterior head regions, symmetric and reactive to eye opening and eye closing. Sleep was characterized by vertex waves, sleep spindles (12 to 14 Hz), maximal frontocentral region.  Physiologic photic driving was seen during photic stimulation.  Hyperventilation was not performed.   IMPRESSION: This study is within normal limits. No seizures or epileptiform discharges were seen throughout the recording. Lora Havens   CT Angio Head W or Wo Contrast  Addendum Date: 02/18/2020   ADDENDUM REPORT: 02/18/2020 20:52 ADDENDUM: 3 mm left upper lobe lung nodule. No follow-up needed if patient is low-risk. Non-contrast chest CT can be considered in 12 months if patient is high-risk. This recommendation follows the consensus statement: Guidelines for Management of Incidental Pulmonary Nodules Detected on CT Images: From the Fleischner Society 2017; Radiology 2017; 284:228-243. Electronically Signed   By: Logan Bores M.D.   On: 02/18/2020 20:52   Result Date: 02/18/2020 CLINICAL DATA:  Altered mental status. Nausea and vomiting. Acute onset headache and vertigo. Family history of subarachnoid hemorrhage. EXAM: CT ANGIOGRAPHY HEAD AND NECK TECHNIQUE: Multidetector  CT imaging of the head and neck was performed using the standard protocol during bolus administration of intravenous contrast. Multiplanar CT image reconstructions and MIPs were obtained to evaluate the vascular anatomy. Carotid stenosis measurements (when applicable) are obtained utilizing NASCET criteria, using  the distal internal carotid diameter as the denominator. CONTRAST:  34mL OMNIPAQUE IOHEXOL 350 MG/ML SOLN COMPARISON:  None. FINDINGS: CTA NECK FINDINGS Aortic arch: Standard 3 vessel aortic arch with widely patent arch vessel origins. Right carotid system: Patent and smooth without evidence of stenosis or dissection. Left carotid system: Patent and smooth without evidence of stenosis or dissection. Vertebral arteries: Patent and smooth without evidence of stenosis or dissection. Moderately dominant left vertebral artery. Skeleton: No acute osseous abnormality or suspicious osseous lesion. Other neck: Asymmetric heterogeneity and enlargement of the left thyroid lobe with a 2.7 cm exophytic nodule extending off the left lower pole. Heterogeneous appearance of the parotid and submandibular glands bilaterally with numerous punctate parotid calcifications bilaterally. Increased number of prominent but subcentimeter lymph nodes in levels I and II bilaterally measuring up to 8 mm in short axis. Upper chest: Mild biapical pleuroparenchymal lung scarring. 3 mm left upper lobe lung nodule. Bilateral breast implants. Review of the MIP images confirms the above findings CTA HEAD FINDINGS Anterior circulation: The internal carotid arteries are widely patent from skull base to carotid termini. ACAs and MCAs are patent without evidence of a proximal branch occlusion or significant proximal stenosis. No aneurysm is identified. Posterior circulation: The intracranial vertebral arteries are widely patent to the basilar. Patent PICA, AICA, and SCA origins are seen bilaterally. There are small to moderate-sized right and diminutive or absent left posterior communicating arteries. PCAs are patent without evidence of a significant proximal stenosis. No aneurysm is identified. Venous sinuses: Patent. Anatomic variants: None. Review of the MIP images confirms the above findings IMPRESSION: 1. No large vessel occlusion, significant  stenosis, or aneurysm in the head and neck. 2. 2.7 cm left thyroid nodule. Recommend thyroid US (ref: J Am Coll Radiol. 2015 Feb;12(2): 143-50). 3. Heterogeneous appearance of the salivary glands with numerous punctate parotid calcifications which may reflect an underlying inflammatory or autoimmune conditions such as Sjogren disease. 4. Borderline upper cervical lymphadenopathy, nonspecific though may be reactive or related to a systemic inflammatory condition. Electronically Signed: By: Logan Bores M.D. On: 02/18/2020 20:02   CT Angio Neck W and/or Wo Contrast  Addendum Date: 02/18/2020   ADDENDUM REPORT: 02/18/2020 20:52 ADDENDUM: 3 mm left upper lobe lung nodule. No follow-up needed if patient is low-risk. Non-contrast chest CT can be considered in 12 months if patient is high-risk. This recommendation follows the consensus statement: Guidelines for Management of Incidental Pulmonary Nodules Detected on CT Images: From the Fleischner Society 2017; Radiology 2017; 284:228-243. Electronically Signed   By: Logan Bores M.D.   On: 02/18/2020 20:52   Result Date: 02/18/2020 CLINICAL DATA:  Altered mental status. Nausea and vomiting. Acute onset headache and vertigo. Family history of subarachnoid hemorrhage. EXAM: CT ANGIOGRAPHY HEAD AND NECK TECHNIQUE: Multidetector CT imaging of the head and neck was performed using the standard protocol during bolus administration of intravenous contrast. Multiplanar CT image reconstructions and MIPs were obtained to evaluate the vascular anatomy. Carotid stenosis measurements (when applicable) are obtained utilizing NASCET criteria, using the distal internal carotid diameter as the denominator. CONTRAST:  63mL OMNIPAQUE IOHEXOL 350 MG/ML SOLN COMPARISON:  None. FINDINGS: CTA NECK FINDINGS Aortic arch: Standard 3 vessel aortic arch with widely patent arch vessel origins. Right  carotid system: Patent and smooth without evidence of stenosis or dissection. Left carotid  system: Patent and smooth without evidence of stenosis or dissection. Vertebral arteries: Patent and smooth without evidence of stenosis or dissection. Moderately dominant left vertebral artery. Skeleton: No acute osseous abnormality or suspicious osseous lesion. Other neck: Asymmetric heterogeneity and enlargement of the left thyroid lobe with a 2.7 cm exophytic nodule extending off the left lower pole. Heterogeneous appearance of the parotid and submandibular glands bilaterally with numerous punctate parotid calcifications bilaterally. Increased number of prominent but subcentimeter lymph nodes in levels I and II bilaterally measuring up to 8 mm in short axis. Upper chest: Mild biapical pleuroparenchymal lung scarring. 3 mm left upper lobe lung nodule. Bilateral breast implants. Review of the MIP images confirms the above findings CTA HEAD FINDINGS Anterior circulation: The internal carotid arteries are widely patent from skull base to carotid termini. ACAs and MCAs are patent without evidence of a proximal branch occlusion or significant proximal stenosis. No aneurysm is identified. Posterior circulation: The intracranial vertebral arteries are widely patent to the basilar. Patent PICA, AICA, and SCA origins are seen bilaterally. There are small to moderate-sized right and diminutive or absent left posterior communicating arteries. PCAs are patent without evidence of a significant proximal stenosis. No aneurysm is identified. Venous sinuses: Patent. Anatomic variants: None. Review of the MIP images confirms the above findings IMPRESSION: 1. No large vessel occlusion, significant stenosis, or aneurysm in the head and neck. 2. 2.7 cm left thyroid nodule. Recommend thyroid US (ref: J Am Coll Radiol. 2015 Feb;12(2): 143-50). 3. Heterogeneous appearance of the salivary glands with numerous punctate parotid calcifications which may reflect an underlying inflammatory or autoimmune conditions such as Sjogren disease. 4.  Borderline upper cervical lymphadenopathy, nonspecific though may be reactive or related to a systemic inflammatory condition. Electronically Signed: By: Logan Bores M.D. On: 02/18/2020 20:02   MR BRAIN W WO CONTRAST  Result Date: 02/19/2020 CLINICAL DATA:  44 year old female with code stroke presentation yesterday. History of atrial fibrillation, breast cancer. No tPA administered. EXAM: MRI HEAD WITHOUT AND WITH CONTRAST TECHNIQUE: Multiplanar, multiecho pulse sequences of the brain and surrounding structures were obtained without and with intravenous contrast. CONTRAST:  5.59mL GADAVIST GADOBUTROL 1 MMOL/ML IV SOLN COMPARISON:  CT head and CTA head and neck yesterday. FINDINGS: Brain: No restricted diffusion to suggest acute infarction. No midline shift, mass effect, evidence of mass lesion, ventriculomegaly, extra-axial collection or acute intracranial hemorrhage. Cervicomedullary junction and pituitary are within normal limits. No abnormal enhancement identified.  No dural thickening. Mild nonspecific cerebral white matter T2 and FLAIR hyperintensity such as on series 11, image 15 in the posterior right hemisphere. No convincing cerebral edema or cortical encephalomalacia. No definite chronic cerebral blood products on SWI. The deep gray nuclei, brainstem and cerebellum appear within normal limits. Vascular: Major intracranial vascular flow voids are preserved, the left vertebral artery appears dominant. Major dural venous sinuses are enhancing and appear to be patent. Skull and upper cervical spine: Visible bone marrow signal and cervical spine within normal limits. Sinuses/Orbits: Negative orbits. Paranasal sinuses are well pneumatized. Other: Mild left mastoid effusion. No complicating features. Other visible internal auditory structures appear grossly normal. Scalp and face soft tissues remarkable for a symmetric granular appearance of the bilateral parotid and submandibular gland parenchyma (series  10, image 1). IMPRESSION: 1. No metastatic disease or acute intracranial abnormality. Minimal to mild for age nonspecific cerebral white matter signal changes. 2. Mild left mastoid effusion, likely postinflammatory and  significance doubtful. 3. Granular appearance of the bilateral salivary glands suggesting Sjogren's syndrome or similar chronic/recurrent inflammatory process. Electronically Signed   By: Genevie Ann M.D.   On: 02/19/2020 21:39   DG Abd Portable 1V  Result Date: 02/21/2020 CLINICAL DATA:  Constipation.  Distention.  Prior stroke. EXAM: PORTABLE ABDOMEN - 1 VIEW COMPARISON:  No prior. FINDINGS: Soft tissue structures are unremarkable. No bowel distention. Stool noted throughout the colon. No acute bony abnormality. Pelvic calcifications consistent phleboliths. IMPRESSION: No acute abnormality. No bowel distention. Stool noted throughout the colon. Electronically Signed   By: Marcello Moores  Register   On: 02/21/2020 11:57   ECHOCARDIOGRAM COMPLETE  Result Date: 02/19/2020    ECHOCARDIOGRAM REPORT   Patient Name:   ALVENIA TREESE Date of Exam: 02/19/2020 Medical Rec #:  [id number]          Height:       65.0 in Accession #:    [id number]         Weight:       120.0 lb Date of Birth:  02-Jul-1975          BSA:          1.592 m Patient Age:    67 years           BP:           91/69 mmHg Patient Gender: F                  HR:           55 bpm. Exam Location:  [facility] Procedure: 2D Echo Indications:    Syncope 780.2 / R55  History:        Patient has no prior history of Echocardiogram examinations.                 Arrythmias:Atrial Fibrillation, Signs/Symptoms:Syncope; Risk                 Factors:Non-Smoker. Cancer.  Sonographer:    Leavy Cella RDCS (AE) Referring Phys: [id number] [facility] [place]  1. Left ventricular ejection fraction, by estimation, is 55 to 60%. The left ventricle has normal function. The left ventricle has no regional wall motion abnormalities. Left ventricular  diastolic parameters were normal.  2. Right ventricular systolic function is normal. The right ventricular size is normal. There is normal pulmonary artery systolic pressure.  3. The mitral valve is grossly normal. Trivial mitral valve regurgitation.  4. The aortic valve is tricuspid. Aortic valve regurgitation is not visualized.  5. The inferior vena cava is normal in size with greater than 50% respiratory variability, suggesting right atrial pressure of 3 mmHg. FINDINGS  Left Ventricle: Left ventricular ejection fraction, by estimation, is 55 to 60%. The left ventricle has normal function. The left ventricle has no regional wall motion abnormalities. The left ventricular internal cavity size was normal in size. There is  no left ventricular hypertrophy. Left ventricular diastolic parameters were normal. Right Ventricle: The right ventricular size is normal. No increase in right ventricular wall thickness. Right ventricular systolic function is normal. There is normal pulmonary artery systolic pressure. The tricuspid regurgitant velocity is 2.03 m/s, and  with an assumed right atrial pressure of 3 mmHg, the estimated right ventricular systolic pressure is 56.3 mmHg. Left Atrium: Left atrial size was normal in size. Right Atrium: Right atrial size was normal in size. Pericardium: There is no evidence of pericardial effusion. Mitral Valve: The mitral valve is grossly normal. Trivial  mitral valve regurgitation. Tricuspid Valve: The tricuspid valve is grossly normal. Tricuspid valve regurgitation is trivial. Aortic Valve: The aortic valve is tricuspid. Aortic valve regurgitation is not visualized. Pulmonic Valve: The pulmonic valve was normal in structure. Pulmonic valve regurgitation is not visualized. Aorta: The aortic root and ascending aorta are structurally normal, with no evidence of dilitation. Venous: The inferior vena cava is normal in size with greater than 50% respiratory variability, suggesting right atrial  pressure of 3 mmHg. IAS/Shunts: No atrial level shunt detected by color flow Doppler.  LEFT VENTRICLE PLAX 2D LVIDd:         3.87 cm  Diastology LVIDs:         2.37 cm  LV e' medial:    9.46 cm/s LV PW:         0.95 cm  LV E/e' medial:  8.4 LV IVS:        0.99 cm  LV e' lateral:   12.10 cm/s LVOT diam:     2.00 cm  LV E/e' lateral: 6.6 LVOT Area:     3.14 cm  RIGHT VENTRICLE RV S prime:     16.40 cm/s TAPSE (M-mode): 2.6 cm LEFT ATRIUM             Index       RIGHT ATRIUM           Index LA diam:        2.40 cm 1.51 cm/m  RA Area:     10.30 cm LA Vol (A2C):   34.0 ml 21.36 ml/m RA Volume:   22.70 ml  14.26 ml/m LA Vol (A4C):   38.9 ml 24.43 ml/m LA Biplane Vol: 37.6 ml 23.62 ml/m   AORTA Ao Root diam: 2.20 cm MITRAL VALVE               TRICUSPID VALVE MV Area (PHT): 2.74 cm    TR Peak grad:   16.5 mmHg MV Decel Time: 277 msec    TR Vmax:        203.00 cm/s MV E velocity: 79.30 cm/s MV A velocity: 28.70 cm/s  SHUNTS MV E/A ratio:  2.76        Systemic Diam: 2.00 cm Lyman Bishop MD Electronically signed by Lyman Bishop MD Signature Date/Time: 02/19/2020/1:12:52 PM    Final    CT VENOGRAM HEAD  Result Date: 02/23/2020 CLINICAL DATA:  44 year old female code stroke presentation on 02/18/2020. Persistent vertigo.  Query dural venous sinus thrombosis. EXAM: CT VENOGRAM HEAD TECHNIQUE: Multi detector CTA of the head for angiographic evaluation of the venous sinuses with multiplanar reformatted images CONTRAST:  65mL OMNIPAQUE IOHEXOL 350 MG/ML SOLN COMPARISON:  Brain MRI without and with contrast 02/19/2020, head CT CTA head and neck 02/18/2020. FINDINGS: Brain: Stable non contrast CT appearance of the brain. Faint dystrophic calcifications at the bilateral basal ganglia. Pearline Cables and white matter signal remains within normal limits. No intracranial mass, acute intracranial hemorrhage or evidence of cortically based infarct. Calvarium and skull base: No acute osseous abnormality identified. Paranasal sinuses:  Tympanic cavities appear to remain clear. There is a left mastoid effusion again noted. Right mastoids are clear. Visible paranasal sinuses remain clear. Orbits: Visualized orbits and scalp soft tissues are within normal limits. In addition to the granular appearance of the parotids on MRI there are numerous punctate bilateral sialoliths noted on CT (series 4, image 1). Venous sinuses: Preserved enhancement of the superior sagittal sinus, with multiple normal arachnoid granulations suspected at the vertex (  such as on series 10, image 94 and see recent MRI series 17, image 10). Preserved enhancement of the torcula, bilateral transverse sinuses, bilateral sigmoid sinuses, bilateral IJ bulbs. Preserved enhancement of the straight sinus, vein of Galen, internal cerebral veins and basal veins of Rosenthal. The cavernous sinus appears patent and enhancing. The major intracranial arterial structures also appear to be enhancing as expected. The major cortical draining veins also appear to be enhancing. Anatomic variants: Slightly dominant right transverse and sigmoid sinus. Review of the MIP images confirms the above findings IMPRESSION: 1. Negative for dural venous sinus thrombosis. The major dural venous structures also appear normal on recent CTA, MRI. 2. Stable and negative CT appearance of the brain. 3. Left mastoid effusion again noted. Most commonly these are postinflammatory and not significant, but follow-up with ENT may be valuable considering persistent vertigo. 4. 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 Electronically Signed   By: Genevie Ann M.D.   On: 02/23/2020 20:48   CT HEAD CODE STROKE WO CONTRAST  Result Date: 02/18/2020 CLINICAL DATA:  Code stroke.  Passed out.  Vomiting today. EXAM: CT HEAD WITHOUT CONTRAST TECHNIQUE: Contiguous axial images were obtained from the base of the skull through the vertex  without intravenous contrast. COMPARISON:  None. FINDINGS: Brain: No evidence of acute infarction, hemorrhage, hydrocephalus, extra-axial collection or mass lesion/mass effect. Mild and incidental mineralization at the globus pallidus. Vascular: No hyperdense vessel or unexpected calcification. Skull: Normal. Negative for fracture or focal lesion. Sinuses/Orbits: Unspecific gaze to the right. Other: Attempted call report but physician is currently on the other line. Bilateral parotid calcifications. ASPECTS [facility] Stroke Program Early CT Score) Not scored with this history IMPRESSION: Normal appearance of the brain. Electronically Signed   By: Monte Fantasia M.D.   On: 02/18/2020 19:49    Micro Results     No results found for this or any previous visit (from the past 240 hour(s)).  Today   Subjective    April Acevedo today has no headache,no chest abdominal pain,no new weakness tingling or numbness, feels much better wants to go home today.    Objective   Blood pressure 113/81, pulse 77, temperature 98 F (36.7 C), temperature source Oral, resp. rate 16, height 5\' 5"  (1.651 m), weight 54.4 kg, SpO2 100 %.  No intake or output data in the 24 hours ending 02/29/20 1119  Exam  Awake Alert, No new F.N deficits, Normal affect [place].AT,PERRAL Supple Neck,No JVD, No cervical lymphadenopathy appriciated.  Symmetrical Chest wall movement, Good air movement bilaterally, CTAB RRR,No Gallops,Rubs or new Murmurs, No Parasternal Heave +ve B.Sounds, Abd Soft, Non tender, No organomegaly appriciated, No rebound -guarding or rigidity. No Cyanosis, Clubbing or edema, No new Rash or bruise   Data Review   CBC w Diff:  Lab Results  Component Value Date   WBC 5.5 02/28/2020   HGB 12.9 02/28/2020   HCT 40.3 02/28/2020   PLT 178 02/28/2020   LYMPHOPCT 44 02/28/2020   MONOPCT 7 02/28/2020   EOSPCT 2 02/28/2020   BASOPCT 0 02/28/2020    CMP:  Lab Results  Component Value Date   NA 137  02/28/2020   K 3.8 02/28/2020   CL 108 02/28/2020   CO2 23 02/28/2020   BUN 21 (H) 02/28/2020   CREATININE 0.99 02/28/2020   PROT 7.3 02/28/2020   ALBUMIN 3.3 (L) 02/28/2020   BILITOT 0.5 02/28/2020   ALKPHOS 45 02/28/2020  AST 19 02/28/2020   ALT 27 02/28/2020  .   Total Time in preparing paper work, data evaluation and todays exam - 14 minutes  Lala Lund M.D on 02/29/2020 at 11:19 AM  [facility]   Office  [phone]

## 2020-03-02 ENCOUNTER — Ambulatory Visit: Payer: Medicare (Managed Care) | Admitting: Internal Medicine

## 2020-03-05 ENCOUNTER — Encounter (HOSPITAL_COMMUNITY): Payer: Self-pay

## 2020-03-05 NOTE — Progress Notes (Signed)
I have attempted to place an introductory phone call to this patient today. Unable to reach the patient at this time. I will plan to meet with her during her initial visit with Dr. Delton Coombes.

## 2020-03-08 ENCOUNTER — Inpatient Hospital Stay (HOSPITAL_COMMUNITY): Payer: Medicare (Managed Care) | Admitting: Hematology

## 2020-03-08 ENCOUNTER — Encounter (HOSPITAL_COMMUNITY): Payer: Self-pay | Admitting: Hematology

## 2020-03-15 ENCOUNTER — Other Ambulatory Visit: Payer: Self-pay

## 2020-03-15 ENCOUNTER — Other Ambulatory Visit (HOSPITAL_COMMUNITY): Payer: Self-pay

## 2020-03-15 ENCOUNTER — Inpatient Hospital Stay (HOSPITAL_COMMUNITY): Payer: Medicare (Managed Care) | Attending: Hematology | Admitting: Hematology

## 2020-03-15 VITALS — BP 109/73 | HR 58 | Temp 96.8°F | Resp 18 | Wt 125.5 lb

## 2020-03-15 DIAGNOSIS — F32A Depression, unspecified: Secondary | ICD-10-CM | POA: Insufficient documentation

## 2020-03-15 DIAGNOSIS — Z79811 Long term (current) use of aromatase inhibitors: Secondary | ICD-10-CM | POA: Diagnosis not present

## 2020-03-15 DIAGNOSIS — M1990 Unspecified osteoarthritis, unspecified site: Secondary | ICD-10-CM | POA: Insufficient documentation

## 2020-03-15 DIAGNOSIS — Z79899 Other long term (current) drug therapy: Secondary | ICD-10-CM | POA: Insufficient documentation

## 2020-03-15 DIAGNOSIS — Z9013 Acquired absence of bilateral breasts and nipples: Secondary | ICD-10-CM | POA: Diagnosis not present

## 2020-03-15 DIAGNOSIS — C50912 Malignant neoplasm of unspecified site of left female breast: Secondary | ICD-10-CM | POA: Diagnosis not present

## 2020-03-15 DIAGNOSIS — E041 Nontoxic single thyroid nodule: Secondary | ICD-10-CM | POA: Insufficient documentation

## 2020-03-15 DIAGNOSIS — I4891 Unspecified atrial fibrillation: Secondary | ICD-10-CM | POA: Insufficient documentation

## 2020-03-15 DIAGNOSIS — Z79818 Long term (current) use of other agents affecting estrogen receptors and estrogen levels: Secondary | ICD-10-CM | POA: Insufficient documentation

## 2020-03-15 DIAGNOSIS — Z23 Encounter for immunization: Secondary | ICD-10-CM

## 2020-03-15 DIAGNOSIS — Z170 Estrogen receptor positive status [ER+]: Secondary | ICD-10-CM | POA: Insufficient documentation

## 2020-03-15 MED ORDER — INFLUENZA VAC SPLIT QUAD 0.5 ML IM SUSY: 0.5000 mL | PREFILLED_SYRINGE | Freq: Once | INTRAMUSCULAR | Status: DC

## 2020-03-15 MED ORDER — EXEMESTANE 25 MG PO TABS
25.0000 mg | ORAL_TABLET | Freq: Every day | ORAL | 6 refills | Status: DC
Start: 2020-03-15 — End: 2021-01-23

## 2020-03-15 NOTE — Progress Notes (Signed)
[facility] [address], [place] [address]   Patient Care Team: Patient, No Pcp Per as PCP - General (General Practice) Dishmon, Garwin Brothers, RN as Oncology Nurse Navigator (Oncology)  CHIEF COMPLAINTS/PURPOSE OF CONSULTATION:  History of left breast cancer  HISTORY OF PRESENTING ILLNESS:  April Acevedo 44 y.o. female is here because of history of left breast cancer, at the request of Dr. Lala Lund from [facility]. She had a double mastectomy in 2017 in [place] and was maintained on Aromasin and Lupron, though she has not taken Lupron in the past several months.  Today she reports that she fainted while she was cooking at home and was informed by EMS that her pulse and BP were low. She reports that she was discontinued from trazodone after she was brought to [facility] and was started on Topamax while inpatient. She took tamoxifen for 3 years and added Lupron after the first year, then after the 3 years was started on Aromasin along with Lupron. Her last Lupron was August 3rd and given every month. She is tolerating Aromasin well and today was her last dose. She continues having a headache since her kitchen fall. She reports having throbbing in her right pelvic area; she reports having one episode of vaginal spotting. She had 2 ablations in 2017 and 2019 to correct an SVT and she continues having PVC's. She reports having extreme depression without plans of harming or killing herself, though she reports having thoughts of how life would be better if she ended her agony. She is currently taking Buspar and Ativan. She had DEXA testing done in [place] on 04/26/2019 with a T-score of 1.6 in the neck of the femur.  She lives at home with her son who is 50; she has a daughter who is in the [employer]. 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 She is a non-smoker. Her paternal uncle had prostate cancer; her paternal GM passed away from colon cancer at age 24; her maternal cousin has ovarian cancer; she  denies history of breast cancer in her family; BRCA testing was done and was negative. She recently returned from [place] where her mother lives.  I reviewed her records extensively and collaborated the history with the patient.  SUMMARY OF ONCOLOGIC HISTORY: Oncology History  Breast cancer, stage 1, estrogen receptor positive, left ([place])  03/15/2020 Initial Diagnosis   Breast cancer, stage 1, estrogen receptor positive, left ([place])   03/15/2020 Cancer Staging   Staging form: Breast, AJCC 8th Edition - Clinical stage from 03/15/2020: Stage IA (cT1c, cN0, cM0, G2, ER+, PR+, HER2-) - Signed by Derek Jack, MD on 03/15/2020     In terms of breast cancer risk profile:  She menarched at early age of 88 and is still menstruating. She had 2 pregnancies, her first child was born at age 11.  She never received birth control pills.  She was never exposed to fertility medications or hormone replacement therapy.  She has positive family history of Breast/GYN/GI cancer.  MEDICAL HISTORY:  Past Medical History:  Diagnosis Date  . Arthritis   . Atrial fibrillation ([place])   . Cancer ([place])   . Thyroid nodule greater than or equal to 1.5 cm in diameter incidentally noted on imaging study 02/20/2020    SURGICAL HISTORY: Past Surgical History:  Procedure Laterality Date  . BREAST SURGERY Bilateral 2017   in [place]    SOCIAL HISTORY: Social History   Socioeconomic History  . Marital  status: Married    Spouse name: Not on file  . Number of children: Not on file  . Years of education: Not on file  . Highest education level: Not on file  Occupational History  . Not on file  Tobacco Use  . Smoking status: Never Smoker  . Smokeless tobacco: Never Used  Vaping Use  . Vaping Use: Never used  Substance and Sexual Activity  . Alcohol use: Yes  . Drug use: Never  . Sexual activity: Not on file  Other Topics Concern  . Not on file  Social History Narrative  . Not on file     Social Determinants of Health   Financial Resource Strain: High Risk  . Difficulty of Paying Living Expenses: Very hard  Food Insecurity: Food Insecurity Present  . Worried About Charity fundraiser in the Last Year: Often true  . Ran Out of Food in the Last Year: Often true  Transportation Needs: Unmet Transportation Needs  . Lack of Transportation (Medical): Yes  . Lack of Transportation (Non-Medical): Yes  Physical Activity: Inactive  . Days of Exercise per Week: 0 days  . Minutes of Exercise per Session: 0 min  Stress: Stress Concern Present  . Feeling of Stress : Very much  Social Connections: Socially Isolated  . Frequency of Communication with Friends and Family: Twice a week  . Frequency of Social Gatherings with Friends and Family: Never  . Attends Religious Services: Never  . Active Member of Clubs or Organizations: No  . Attends Archivist Meetings: Never  . Marital Status: Never married  Intimate Partner Violence: Not At Risk  . Fear of Current or Ex-Partner: No  . Emotionally Abused: No  . Physically Abused: No  . Sexually Abused: No    FAMILY HISTORY: No family history on file.  ALLERGIES:  has No Known Allergies.  MEDICATIONS:  Current Outpatient Medications  Medication Sig Dispense Refill  . busPIRone (BUSPAR) 7.5 MG tablet Take 7.5 mg by mouth daily.    Marland Kitchen exemestane (AROMASIN) 25 MG tablet Take 25 mg by mouth daily after breakfast.    . ibuprofen (ADVIL) 800 MG tablet Take 800 mg by mouth every 8 (eight) hours as needed.    Marland Kitchen leuprolide (LUPRON) 7.5 MG injection Inject 7.5 mg into the muscle every 28 (twenty-eight) days.     . meclizine (ANTIVERT) 25 MG tablet Take 1 tablet (25 mg total) by mouth 3 (three) times daily as needed for dizziness. 30 tablet 0  . NONFORMULARY OR COMPOUNDED ITEM outpatient vestibular therapy. 1 each 0  . propranolol (INDERAL) 10 MG tablet Take 1 tablet (10 mg total) by mouth at bedtime. 30 tablet 0  . SUMAtriptan  (IMITREX) 50 MG tablet Take 1 tablet (50 mg total) by mouth daily as needed for migraine. May repeat in 2 hours if headache persists or recurs. 15 tablet 2  . LORazepam (ATIVAN PO) Take by mouth. (Patient not taking: Reported on 03/15/2020)    . topiramate (TOPAMAX) 50 MG tablet Take 1 tablet (50 mg total) by mouth at bedtime. (Patient not taking: Reported on 03/15/2020) 30 tablet 0   Current Facility-Administered Medications  Medication Dose Route Frequency Provider Last Rate Last Admin  . influenza vac split quadrivalent PF (FLUARIX) injection 0.5 mL  0.5 mL Intramuscular Once Derek Jack, MD        REVIEW OF SYSTEMS:   Review of Systems  Constitutional: Positive for appetite change (80%). Negative for fatigue.  Respiratory: Positive  for cough (when fatigued) and shortness of breath (when fatigued).   Cardiovascular: Positive for palpitations.  Gastrointestinal: Positive for constipation, nausea and vomiting.  Genitourinary: Positive for pelvic pain (5/10 R pelvic area pain) and vaginal bleeding (one episode of spotting).   Neurological: Positive for headaches and light-headedness.  Psychiatric/Behavioral: Positive for depression and sleep disturbance. The patient is nervous/anxious.   All other systems reviewed and are negative.   PHYSICAL EXAMINATION: ECOG PERFORMANCE STATUS: 0 - Asymptomatic  Vitals:   03/15/20 0810  BP: 109/73  Pulse: (!) 58  Resp: 18  Temp: (!) 96.8 F (36 C)  SpO2: 98%   Filed Weights   03/15/20 0810  Weight: 125 lb 8 oz (56.9 kg)   Physical Exam Vitals reviewed.  Constitutional:      Appearance: Normal appearance.  Cardiovascular:     Rate and Rhythm: Normal rate and regular rhythm.     Pulses: Normal pulses.     Heart sounds: Normal heart sounds.  Pulmonary:     Effort: Pulmonary effort is normal.     Breath sounds: Normal breath sounds.  Chest:     Breasts:        Right: No mass.        Left: No mass.     Comments: Bilateral  nipple-sparing mastectomies with bilateral implants Abdominal:     Palpations: Abdomen is soft. There is no hepatomegaly, splenomegaly or mass.     Tenderness: There is no abdominal tenderness.     Hernia: No hernia is present.  Lymphadenopathy:     Upper Body:     Right upper body: No axillary or pectoral adenopathy.     Left upper body: Axillary adenopathy (sub-cm LN) present.     Lower Body: No right inguinal adenopathy. No left inguinal adenopathy.  Neurological:     General: No focal deficit present.     Mental Status: She is alert and oriented to person, place, and time.  Psychiatric:        Mood and Affect: Mood normal.        Behavior: Behavior normal.      LABORATORY DATA:  I have reviewed the data as listed Recent Results (from the past 2160 hour(s))  CBC with Differential/Platelet     Status: None   Collection Time: 02/28/20  5:04 AM  Result Value Ref Range   WBC 5.5 4.0 - 10.5 K/uL   RBC 4.88 3.87 - 5.11 MIL/uL   Hemoglobin 12.9 12.0 - 15.0 g/dL   HCT 40.3 36 - 46 %   MCV 82.6 80.0 - 100.0 fL   MCH 26.4 26.0 - 34.0 pg   MCHC 32.0 30.0 - 36.0 g/dL   RDW 13.2 11.5 - 15.5 %   Platelets 178 150 - 400 K/uL   nRBC 0.0 0.0 - 0.2 %   Neutrophils Relative % 46 %   Neutro Abs 2.5 1.7 - 7.7 K/uL   Lymphocytes Relative 44 %   Lymphs Abs 2.4 0.7 - 4.0 K/uL   Monocytes Relative 7 %   Monocytes Absolute 0.4 0.1 - 1.0 K/uL   Eosinophils Relative 2 %   Eosinophils Absolute 0.1 0.0 - 0.5 K/uL   Basophils Relative 0 %   Basophils Absolute 0.0 0.0 - 0.1 K/uL   Immature Granulocytes 1 %   Abs Immature Granulocytes 0.03 0.00 - 0.07 K/uL    Comment: Performed at [facility], [address] [place], [place] [address]  Comprehensive metabolic panel  Status: Abnormal   Collection Time: 02/28/20  5:04 AM  Result Value Ref Range   Sodium 137 135 - 145 mmol/L   Potassium 3.8 3.5 - 5.1 mmol/L   Chloride 108 98 - 111 mmol/L   CO2 23 22 - 32 mmol/L   Glucose, Bld 94 70  - 99 mg/dL    Comment: Glucose reference range applies only to samples taken after fasting for at least 8 hours.   BUN 21 (H) 6 - 20 mg/dL   Creatinine, Ser 0.99 0.44 - 1.00 mg/dL   Calcium 9.3 8.9 - 10.3 mg/dL   Total Protein 7.3 6.5 - 8.1 g/dL   Albumin 3.3 (L) 3.5 - 5.0 g/dL   AST 19 15 - 41 U/L   ALT 27 0 - 44 U/L   Alkaline Phosphatase 45 38 - 126 U/L   Total Bilirubin 0.5 0.3 - 1.2 mg/dL   GFR, Estimated >60 >60 mL/min    Comment: (NOTE) Calculated using the CKD-EPI Creatinine Equation (2021)    Anion gap 6 5 - 15    Comment: Performed at [facility] [address], [place], [place] [address]  Magnesium     Status: None   Collection Time: 02/28/20  5:04 AM  Result Value Ref Range   Magnesium 2.1 1.7 - 2.4 mg/dL    Comment: Performed at [facility] [address] [place], [place] [address]    RADIOGRAPHIC STUDIES: I have personally reviewed the radiological reports and agreed with the findings in the report. EEG  Result Date: 02/20/2020 Lora Havens, MD     02/20/2020  1:58 PM Patient Name: Sojourner Behringer MRN: [id number] Epilepsy Attending: Lora Havens Referring Physician/Provider: Dr Dannielle Huh Date: 02/20/2020 Duration: 24.42 minutes Patient history: 59 old female who presented with an episode of syncope.  EEG to evaluate for seizures. Level of alertness: Awake, asleep AEDs during EEG study: None Technical aspects: This EEG study was done with scalp electrodes positioned according to the 10-20 International system of electrode placement. Electrical activity was acquired at a sampling rate of 500Hz and reviewed with a high frequency filter of 70Hz and a low frequency filter of 1Hz. EEG data were recorded continuously and digitally stored. Description: The posterior dominant rhythm consists of 9 Hz activity of moderate voltage (25-35 uV) seen predominantly in posterior head regions, symmetric and reactive to eye opening and eye closing. Sleep was  characterized by vertex waves, sleep spindles (12 to 14 Hz), maximal frontocentral region.  Physiologic photic driving was seen during photic stimulation.  Hyperventilation was not performed.   IMPRESSION: This study is within normal limits. No seizures or epileptiform discharges were seen throughout the recording. Lora Havens   CT Angio Head W or Wo Contrast  Addendum Date: 02/18/2020   ADDENDUM REPORT: 02/18/2020 20:52 ADDENDUM: 3 mm left upper lobe lung nodule. No follow-up needed if patient is low-risk. Non-contrast chest CT can be considered in 12 months if patient is high-risk. This recommendation follows the consensus statement: Guidelines for Management of Incidental Pulmonary Nodules Detected on CT Images: From the Fleischner Society 2017; Radiology 2017; 284:228-243. Electronically Signed   By: Logan Bores M.D.   On: 02/18/2020 20:52   Result Date: 02/18/2020 CLINICAL DATA:  Altered mental status. Nausea and vomiting. Acute onset headache and vertigo. Family history of subarachnoid hemorrhage. EXAM: CT ANGIOGRAPHY HEAD AND NECK TECHNIQUE: Multidetector CT imaging of the head and neck was performed using the standard protocol during bolus  administration of intravenous contrast. Multiplanar CT image reconstructions and MIPs were obtained to evaluate the vascular anatomy. Carotid stenosis measurements (when applicable) are obtained utilizing NASCET criteria, using the distal internal carotid diameter as the denominator. CONTRAST:  49m OMNIPAQUE IOHEXOL 350 MG/ML SOLN COMPARISON:  None. FINDINGS: CTA NECK FINDINGS Aortic arch: Standard 3 vessel aortic arch with widely patent arch vessel origins. Right carotid system: Patent and smooth without evidence of stenosis or dissection. Left carotid system: Patent and smooth without evidence of stenosis or dissection. Vertebral arteries: Patent and smooth without evidence of stenosis or dissection. Moderately dominant left vertebral artery. Skeleton: No  acute osseous abnormality or suspicious osseous lesion. Other neck: Asymmetric heterogeneity and enlargement of the left thyroid lobe with a 2.7 cm exophytic nodule extending off the left lower pole. Heterogeneous appearance of the parotid and submandibular glands bilaterally with numerous punctate parotid calcifications bilaterally. Increased number of prominent but subcentimeter lymph nodes in levels I and II bilaterally measuring up to 8 mm in short axis. Upper chest: Mild biapical pleuroparenchymal lung scarring. 3 mm left upper lobe lung nodule. Bilateral breast implants. Review of the MIP images confirms the above findings CTA HEAD FINDINGS Anterior circulation: The internal carotid arteries are widely patent from skull base to carotid termini. ACAs and MCAs are patent without evidence of a proximal branch occlusion or significant proximal stenosis. No aneurysm is identified. Posterior circulation: The intracranial vertebral arteries are widely patent to the basilar. Patent PICA, AICA, and SCA origins are seen bilaterally. There are small to moderate-sized right and diminutive or absent left posterior communicating arteries. PCAs are patent without evidence of a significant proximal stenosis. No aneurysm is identified. Venous sinuses: Patent. Anatomic variants: None. Review of the MIP images confirms the above findings IMPRESSION: 1. No large vessel occlusion, significant stenosis, or aneurysm in the head and neck. 2. 2.7 cm left thyroid nodule. Recommend thyroid [place](ref: J Am Coll Radiol. 2015 Feb;12(2): 143-50). 3. Heterogeneous appearance of the salivary glands with numerous punctate parotid calcifications which may reflect an underlying inflammatory or autoimmune conditions such as Sjogren disease. 4. Borderline upper cervical lymphadenopathy, nonspecific though may be reactive or related to a systemic inflammatory condition. Electronically Signed: By: ALogan BoresM.D. On: 02/18/2020 20:02   CT Angio  Neck W and/or Wo Contrast  Addendum Date: 02/18/2020   ADDENDUM REPORT: 02/18/2020 20:52 ADDENDUM: 3 mm left upper lobe lung nodule. No follow-up needed if patient is low-risk. Non-contrast chest CT can be considered in 12 months if patient is high-risk. This recommendation follows the consensus statement: Guidelines for Management of Incidental Pulmonary Nodules Detected on CT Images: From the Fleischner Society 2017; Radiology 2017; 284:228-243. Electronically Signed   By: ALogan BoresM.D.   On: 02/18/2020 20:52   Result Date: 02/18/2020 CLINICAL DATA:  Altered mental status. Nausea and vomiting. Acute onset headache and vertigo. Family history of subarachnoid hemorrhage. EXAM: CT ANGIOGRAPHY HEAD AND NECK TECHNIQUE: Multidetector CT imaging of the head and neck was performed using the standard protocol during bolus administration of intravenous contrast. Multiplanar CT image reconstructions and MIPs were obtained to evaluate the vascular anatomy. Carotid stenosis measurements (when applicable) are obtained utilizing NASCET criteria, using the distal internal carotid diameter as the denominator. CONTRAST:  774mOMNIPAQUE IOHEXOL 350 MG/ML SOLN COMPARISON:  None. FINDINGS: CTA NECK FINDINGS Aortic arch: Standard 3 vessel aortic arch with widely patent arch vessel origins. Right carotid system: Patent and smooth without evidence of stenosis or dissection. Left carotid system: Patent  and smooth without evidence of stenosis or dissection. Vertebral arteries: Patent and smooth without evidence of stenosis or dissection. Moderately dominant left vertebral artery. Skeleton: No acute osseous abnormality or suspicious osseous lesion. Other neck: Asymmetric heterogeneity and enlargement of the left thyroid lobe with a 2.7 cm exophytic nodule extending off the left lower pole. Heterogeneous appearance of the parotid and submandibular glands bilaterally with numerous punctate parotid calcifications bilaterally.  Increased number of prominent but subcentimeter lymph nodes in levels I and II bilaterally measuring up to 8 mm in short axis. Upper chest: Mild biapical pleuroparenchymal lung scarring. 3 mm left upper lobe lung nodule. Bilateral breast implants. Review of the MIP images confirms the above findings CTA HEAD FINDINGS Anterior circulation: The internal carotid arteries are widely patent from skull base to carotid termini. ACAs and MCAs are patent without evidence of a proximal branch occlusion or significant proximal stenosis. No aneurysm is identified. Posterior circulation: The intracranial vertebral arteries are widely patent to the basilar. Patent PICA, AICA, and SCA origins are seen bilaterally. There are small to moderate-sized right and diminutive or absent left posterior communicating arteries. PCAs are patent without evidence of a significant proximal stenosis. No aneurysm is identified. Venous sinuses: Patent. Anatomic variants: None. Review of the MIP images confirms the above findings IMPRESSION: 1. No large vessel occlusion, significant stenosis, or aneurysm in the head and neck. 2. 2.7 cm left thyroid nodule. Recommend thyroid US (ref: J Am Coll Radiol. 2015 Feb;12(2): 143-50). 3. Heterogeneous appearance of the salivary glands with numerous punctate parotid calcifications which may reflect an underlying inflammatory or autoimmune conditions such as Sjogren disease. 4. Borderline upper cervical lymphadenopathy, nonspecific though may be reactive or related to a systemic inflammatory condition. Electronically Signed: By: Logan Bores M.D. On: 02/18/2020 20:02   MR BRAIN W WO CONTRAST  Result Date: 02/19/2020 CLINICAL DATA:  44 year old female with code stroke presentation yesterday. History of atrial fibrillation, breast cancer. No tPA administered. EXAM: MRI HEAD WITHOUT AND WITH CONTRAST TECHNIQUE: Multiplanar, multiecho pulse sequences of the brain and surrounding structures were obtained without  and with intravenous contrast. CONTRAST:  5.38m GADAVIST GADOBUTROL 1 MMOL/ML IV SOLN COMPARISON:  CT head and CTA head and neck yesterday. FINDINGS: Brain: No restricted diffusion to suggest acute infarction. No midline shift, mass effect, evidence of mass lesion, ventriculomegaly, extra-axial collection or acute intracranial hemorrhage. Cervicomedullary junction and pituitary are within normal limits. No abnormal enhancement identified.  No dural thickening. Mild nonspecific cerebral white matter T2 and FLAIR hyperintensity such as on series 11, image 15 in the posterior right hemisphere. No convincing cerebral edema or cortical encephalomalacia. No definite chronic cerebral blood products on SWI. The deep gray nuclei, brainstem and cerebellum appear within normal limits. Vascular: Major intracranial vascular flow voids are preserved, the left vertebral artery appears dominant. Major dural venous sinuses are enhancing and appear to be patent. Skull and upper cervical spine: Visible bone marrow signal and cervical spine within normal limits. Sinuses/Orbits: Negative orbits. Paranasal sinuses are well pneumatized. Other: Mild left mastoid effusion. No complicating features. Other visible internal auditory structures appear grossly normal. Scalp and face soft tissues remarkable for a symmetric granular appearance of the bilateral parotid and submandibular gland parenchyma (series 10, image 1). IMPRESSION: 1. No metastatic disease or acute intracranial abnormality. Minimal to mild for age nonspecific cerebral white matter signal changes. 2. Mild left mastoid effusion, likely postinflammatory and significance doubtful. 3. Granular appearance of the bilateral salivary glands suggesting Sjogren's syndrome or similar chronic/recurrent  inflammatory process. Electronically Signed   By: Genevie Ann M.D.   On: 02/19/2020 21:39   DG Abd Portable 1V  Result Date: 02/21/2020 CLINICAL DATA:  Constipation.  Distention.  Prior  stroke. EXAM: PORTABLE ABDOMEN - 1 VIEW COMPARISON:  No prior. FINDINGS: Soft tissue structures are unremarkable. No bowel distention. Stool noted throughout the colon. No acute bony abnormality. Pelvic calcifications consistent phleboliths. IMPRESSION: No acute abnormality. No bowel distention. Stool noted throughout the colon. Electronically Signed   By: Marcello Moores  Register   On: 02/21/2020 11:57   ECHOCARDIOGRAM COMPLETE  Result Date: 02/19/2020    ECHOCARDIOGRAM REPORT   Patient Name:   JANAYA BROY Date of Exam: 02/19/2020 Medical Rec #:  [id number]          Height:       65.0 in Accession #:    [id number]         Weight:       120.0 lb Date of Birth:  April 29, 1976          BSA:          1.592 m Patient Age:    37 years           BP:           91/69 mmHg Patient Gender: F                  HR:           55 bpm. Exam Location:  [facility] Procedure: 2D Echo Indications:    Syncope 780.2 / R55  History:        Patient has no prior history of Echocardiogram examinations.                 Arrythmias:Atrial Fibrillation, Signs/Symptoms:Syncope; Risk                 Factors:Non-Smoker. Cancer.  Sonographer:    Leavy Cella RDCS (AE) Referring Phys: [id number] ASIA B [place]  1. Left ventricular ejection fraction, by estimation, is 55 to 60%. The left ventricle has normal function. The left ventricle has no regional wall motion abnormalities. Left ventricular diastolic parameters were normal.  2. Right ventricular systolic function is normal. The right ventricular size is normal. There is normal pulmonary artery systolic pressure.  3. The mitral valve is grossly normal. Trivial mitral valve regurgitation.  4. The aortic valve is tricuspid. Aortic valve regurgitation is not visualized.  5. The inferior vena cava is normal in size with greater than 50% respiratory variability, suggesting right atrial pressure of 3 mmHg. FINDINGS  Left Ventricle: Left ventricular ejection fraction, by estimation, is  55 to 60%. The left ventricle has normal function. The left ventricle has no regional wall motion abnormalities. The left ventricular internal cavity size was normal in size. There is  no left ventricular hypertrophy. Left ventricular diastolic parameters were normal. Right Ventricle: The right ventricular size is normal. No increase in right ventricular wall thickness. Right ventricular systolic function is normal. There is normal pulmonary artery systolic pressure. The tricuspid regurgitant velocity is 2.03 m/s, and  with an assumed right atrial pressure of 3 mmHg, the estimated right ventricular systolic pressure is 70.6 mmHg. Left Atrium: Left atrial size was normal in size. Right Atrium: Right atrial size was normal in size. Pericardium: There is no evidence of pericardial effusion. Mitral Valve: The mitral valve is grossly normal. Trivial mitral valve regurgitation. Tricuspid Valve: The tricuspid valve is grossly normal. Tricuspid valve regurgitation is  trivial. Aortic Valve: The aortic valve is tricuspid. Aortic valve regurgitation is not visualized. Pulmonic Valve: The pulmonic valve was normal in structure. Pulmonic valve regurgitation is not visualized. Aorta: The aortic root and ascending aorta are structurally normal, with no evidence of dilitation. Venous: The inferior vena cava is normal in size with greater than 50% respiratory variability, suggesting right atrial pressure of 3 mmHg. IAS/Shunts: No atrial level shunt detected by color flow Doppler.  LEFT VENTRICLE PLAX 2D LVIDd:         3.87 cm  Diastology LVIDs:         2.37 cm  LV e' medial:    9.46 cm/s LV PW:         0.95 cm  LV E/e' medial:  8.4 LV IVS:        0.99 cm  LV e' lateral:   12.10 cm/s LVOT diam:     2.00 cm  LV E/e' lateral: 6.6 LVOT Area:     3.14 cm  RIGHT VENTRICLE RV S prime:     16.40 cm/s TAPSE (M-mode): 2.6 cm LEFT ATRIUM             Index       RIGHT ATRIUM           Index LA diam:        2.40 cm 1.51 cm/m  RA Area:      10.30 cm LA Vol (A2C):   34.0 ml 21.36 ml/m RA Volume:   22.70 ml  14.26 ml/m LA Vol (A4C):   38.9 ml 24.43 ml/m LA Biplane Vol: 37.6 ml 23.62 ml/m   AORTA Ao Root diam: 2.20 cm MITRAL VALVE               TRICUSPID VALVE MV Area (PHT): 2.74 cm    TR Peak grad:   16.5 mmHg MV Decel Time: 277 msec    TR Vmax:        203.00 cm/s MV E velocity: 79.30 cm/s MV A velocity: 28.70 cm/s  SHUNTS MV E/A ratio:  2.76        Systemic Diam: 2.00 cm Lyman Bishop MD Electronically signed by Lyman Bishop MD Signature Date/Time: 02/19/2020/1:12:52 PM    Final    CT VENOGRAM HEAD  Result Date: 02/23/2020 CLINICAL DATA:  44 year old female code stroke presentation on 02/18/2020. Persistent vertigo.  Query dural venous sinus thrombosis. EXAM: CT VENOGRAM HEAD TECHNIQUE: Multi detector CTA of the head for angiographic evaluation of the venous sinuses with multiplanar reformatted images CONTRAST:  7m OMNIPAQUE IOHEXOL 350 MG/ML SOLN COMPARISON:  Brain MRI without and with contrast 02/19/2020, head CT CTA head and neck 02/18/2020. FINDINGS: Brain: Stable non contrast CT appearance of the brain. Faint dystrophic calcifications at the bilateral basal ganglia. GPearline Cablesand white matter signal remains within normal limits. No intracranial mass, acute intracranial hemorrhage or evidence of cortically based infarct. Calvarium and skull base: No acute osseous abnormality identified. Paranasal sinuses: Tympanic cavities appear to remain clear. There is a left mastoid effusion again noted. Right mastoids are clear. Visible paranasal sinuses remain clear. Orbits: Visualized orbits and scalp soft tissues are within normal limits. In addition to the granular appearance of the parotids on MRI there are numerous punctate bilateral sialoliths noted on CT (series 4, image 1). Venous sinuses: Preserved enhancement of the superior sagittal sinus, with multiple normal arachnoid granulations suspected at the vertex (such as on series 10, image 94  and see recent MRI series 17, image 10).  Preserved enhancement of the torcula, bilateral transverse sinuses, bilateral sigmoid sinuses, bilateral IJ bulbs. Preserved enhancement of the straight sinus, vein of Galen, internal cerebral veins and basal veins of Rosenthal. The cavernous sinus appears patent and enhancing. The major intracranial arterial structures also appear to be enhancing as expected. The major cortical draining veins also appear to be enhancing. Anatomic variants: Slightly dominant right transverse and sigmoid sinus. Review of the MIP images confirms the above findings IMPRESSION: 1. Negative for dural venous sinus thrombosis. The major dural venous structures also appear normal on recent CTA, MRI. 2. Stable and negative CT appearance of the brain. 3. Left mastoid effusion again noted. Most commonly these are postinflammatory and not significant, but follow-up with ENT may be valuable considering persistent vertigo. 4. 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 Electronically Signed   By: Genevie Ann M.D.   On: 02/23/2020 20:48   CT HEAD CODE STROKE WO CONTRAST  Result Date: 02/18/2020 CLINICAL DATA:  Code stroke.  Passed out.  Vomiting today. EXAM: CT HEAD WITHOUT CONTRAST TECHNIQUE: Contiguous axial images were obtained from the base of the skull through the vertex without intravenous contrast. COMPARISON:  None. FINDINGS: Brain: No evidence of acute infarction, hemorrhage, hydrocephalus, extra-axial collection or mass lesion/mass effect. Mild and incidental mineralization at the globus pallidus. Vascular: No hyperdense vessel or unexpected calcification. Skull: Normal. Negative for fracture or focal lesion. Sinuses/Orbits: Unspecific gaze to the right. Other: Attempted call report but physician is currently on the other line. Bilateral parotid calcifications. ASPECTS [facility] Stroke Program Early CT  Score) Not scored with this history IMPRESSION: Normal appearance of the brain. Electronically Signed   By: Monte Fantasia M.D.   On: 02/18/2020 19:49     ASSESSMENT:  1.  T1CN0 left breast IDC, ER/PR positive, HER-2 negative: -Bilateral nipple sparing maste[facility] on 10/12/[facility] [place]. -Pathology showing 1.8 cm IDC, margins negative, grade 2, ER/PR positive, HER-2 negative, PT1CPN0. -Oncotype DX recurrence score of 21 -Treated with tamoxifen for a year, Lupron was added during year 2, tamoxifen continued for a total of 3 years. -Tamoxifen changed to Aromasin, Lupron monthly continued. -She recently moved to [place].  She did not receive Lupron injection for 2 months. -Genetic testing was negative for BRCA 1 and 2 -Ultrasound on 06/09/2019 in [place] showed left axillary lymph node. -She was recently hospitalized for syncopal episode.  CT angio of the head and neck on 02/18/2020 showed 3 mm left upper lobe lung nodule. -Brain MRI on 02/19/2020 without evidence of metastatic disease.  2.  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  She lives with her son. -Paternal uncle had prostate cancer.  Paternal grandmother died of colon cancer at age 20.  Maternal cousin had ovarian cancer.  3.  Bone health: -DEXA scan on 04/26/2019 with T score of -1.6.    PLAN:  1.  T1CN0 left breast cancer: -Physical exam today did not reveal any palpable masses.  Subcentimeter left axillary lymph node present. -We will give her Lupron injection today. -We will give refill for Aromasin. -She has severe mood changes with her depression, likely contributed by antiestrogen therapy.  We will refer for psychiatric evaluation.  She is on buspirone 7.5 mg daily.  She does not have any suicidal ideation at this time. -We also discussed surgical oophorectomy.  She is interested.  We will make a referral for GYN. -RTC  in 3 months for follow-up with  labs. -We will follow up on 3 mm left upper lobe lung nodule incidentally found on CT angio head on 02/18/2020 with repeat imaging in 12 months.  2.  Bone health: -Recommend weightbearing exercises.  Recommend calcium and vitamin D intake.    Derek Jack, MD 03/15/20 8:37 AM  [place] [phone]   I, Milinda Antis, am acting as a scribe for Dr. Sanda Linger.  I, Derek Jack MD, have reviewed the above documentation for accuracy and completeness, and I agree with the above.

## 2020-03-15 NOTE — Patient Instructions (Addendum)
[place] at [facility] Discharge Instructions  You were seen and examined today by Dr. Delton Coombes. Dr. Delton Coombes discussed your past medical history and family history of cancer. Dr. Delton Coombes discussed your cancer history and treatment regimen.  If you would like to have your ovaries removed, you would not require the Lupron injections. The reason the ovaries can be removed is because your breast cancer is fed by estrogen and the ovaries create estrogen naturally in your body. Please let us know if you are interested in doing this.  Dr. Delton Coombes has reviewed your records. Your risk of cancer recurrence is very low.   We will make a referral to our Social Worker so that you can discuss your depression.  You will return to the clinic for follow-up in 3 months.    Thank you for choosing [place] at [facility] to provide your oncology and hematology care.  To afford each patient quality time with our provider, please arrive at least 15 minutes before your scheduled appointment time.   If you have a lab appointment with the [facility] please come in thru the Main Entrance and check in at the main information desk.  You need to re-schedule your appointment should you arrive 10 or more minutes late.  We strive to give you quality time with our providers, and arriving late affects you and other patients whose appointments are after yours.  Also, if you no show three or more times for appointments you may be dismissed from the clinic at the providers discretion.     Again, thank you for choosing [facility].  Our hope is that these requests will decrease the amount of time that you wait before being seen by our physicians.       _____________________________________________________________  Should you have questions after your visit to [facility], please contact our office at [phone] and follow the prompts.   Our office hours are 8:00 a.m. and 4:30 p.m. Monday - Friday.  Please note that voicemails left after 4:00 p.m. may not be returned until the following business day.  We are closed weekends and major holidays.  You do have access to a nurse 24-7, just call the main number to the clinic [phone] and do not press any options, hold on the line and a nurse will answer the phone.    For prescription refill requests, have your pharmacy contact our office and allow 72 hours.    Due to Covid, you will need to wear a mask upon entering the hospital. If you do not have a mask, a mask will be given to you at the Main Entrance upon arrival. For doctor visits, patients may have 1 support person age 62 or older with them. For treatment visits, patients can not have anyone with them due to social distancing guidelines and our immunocompromised population.

## 2020-03-16 ENCOUNTER — Inpatient Hospital Stay (HOSPITAL_COMMUNITY): Payer: Medicare (Managed Care)

## 2020-03-16 VITALS — BP 109/64 | HR 60 | Temp 97.4°F | Resp 17

## 2020-03-16 DIAGNOSIS — C50912 Malignant neoplasm of unspecified site of left female breast: Secondary | ICD-10-CM | POA: Diagnosis not present

## 2020-03-16 DIAGNOSIS — Z170 Estrogen receptor positive status [ER+]: Secondary | ICD-10-CM

## 2020-03-16 MED ORDER — LEUPROLIDE ACETATE (3 MONTH) 11.25 MG IM KIT
11.2500 mg | PACK | Freq: Once | INTRAMUSCULAR | Status: AC
  Administered 2020-03-16: 11.25 mg via INTRAMUSCULAR
  Filled 2020-03-16: qty 11.25

## 2020-03-16 NOTE — Progress Notes (Signed)
Patient tolerated Lupron injection with no complaints voiced.  Site clean and dry with no bruising or swelling noted.  No complaints of pain.  Discharged with vital signs stable and no signs or symptoms of distress noted.

## 2020-03-20 ENCOUNTER — Inpatient Hospital Stay (HOSPITAL_COMMUNITY): Payer: Medicare (Managed Care) | Admitting: General Practice

## 2020-03-20 DIAGNOSIS — Z170 Estrogen receptor positive status [ER+]: Secondary | ICD-10-CM

## 2020-03-20 DIAGNOSIS — C50912 Malignant neoplasm of unspecified site of left female breast: Secondary | ICD-10-CM

## 2020-03-20 NOTE — Progress Notes (Signed)
[facility] CSW Progress Notes  Call to patient, no answer, left VM w contact information and encouragement to call back.  Edwyna Shell, LCSW Clinical Social Worker Phone:  [phone]

## 2020-04-02 ENCOUNTER — Institutional Professional Consult (permissible substitution): Payer: Medicare (Managed Care) | Admitting: Internal Medicine

## 2020-04-02 NOTE — Progress Notes (Deleted)
April Acevedo, female    DOB: 08/14/1975, 44 y.o.   MRN: [id number]   Brief patient profile:    Admit date: 02/18/2020  Discharge date: 02/29/2020 Admitted From: Home   Disposition:  Home Recommendations for Outpatient Follow-up:  PCP Please obtain BMP/CBC, 2 view CXR in 1week,  (see Discharge instructions)  PCP Please follow up on the following pending results: Needs outpatient follow-up with neurology, oncology, pulmonary, endocrine.  Monitor orthostatics closely.  Review imaging results in detail.   Home Health: Patient preferred outpatient PT which has been ordered Equipment/Devices: Kindly see below Consultations: Neurology Discharge Condition: Stable    CODE STATUS: Full   Diet Recommendation: Heart Healthy       Diet Order                  Diet - low sodium heart healthy            Diet regular Room service appropriate? Yes; Fluid consistency: Thin  Diet effective now                      Chief Complaint  Patient presents with  . Loss of Consciousness     Brief history of present illness from the day of admission and additional interim summary    Brief Narrative: Patient is a44 y.o.femalePAF-not on anticoagulation, breast cancer-presented to the hospital following a syncopal episode-further hospital course complicated by peripheral vertigo and frontal headaches. See below for further details.  Significant events: 10/16>> admit to [facility] for evaluation of syncope  Significant studies: 10/16>> CT head: No acute abnormalities 10/16>> CTA head and neck: No large vessel occlusion or stenosis or aneurysm in the head and neck. 10/17>> MRI brain: No metastatic disease acute intracranial abnormality 10/17>> Echo: EF 55-60% 10/19>> X-ray abdomen: No acute abnormality 10/21>> CT venogram head: Negative for dural venous sinus thrombosis                                                                 Hospital Course     Syncope:  Etiology unclear-some concern for seizures-evaluated by neurology-advised to hold off on AEDs unless patient has recurrent symptoms. Telemetry negative for arrhythmia-echo with preserved EF, this likely could have been due to dehydration causing orthostatic hypotension, also supine pressures low, beta-blocker dose dropped, adequate hydration with IV fluids and TED stockings blood pressure is much improved, she is today symptom-free.  Will be discharged home.  Beta-blocker dose dropped.  EKG remained stable, no focal deficits, head CT and MRI nonacute, discussed with neurology they will evaluate her again on 02/27/2020 as well.Previously per neurology-no driving for 6 months with full seizure precautions. Home health equipment ordered.  Continue outpatient neurology follow-up.  PCP to monitor blood pressure and orthostatics.  Vertigo:Vertigo appears positional-MRI brain without any acute abnormalities. CT venogram brain with no evidence of dural venous sinus thrombosis. Continue as needed meclizine-continued attempts at physical therapy-seems to be slowly improving-continue supportive care. Will need outpatient vestibular therapy per CM.  Headache: Much improved after 2 doses of IV Depacon and Solu-Medrol followed by as needed NSAIDs and Imitrex , nightly Topamax, CT brain, MRI MRV brain, EEG nonacute.  Seen by neurology Case discussed with Dr. Leonel Ramsay.  Will require outpatient follow-up with neurology  as well.  MAU:[id number] in sinus rhythm-Per prior documentation-not on anticoagulation due to interaction with her cancer medications. Beta-blocker dose reducedfurther on 02/27/2020 due to orthostatic hypotension.  Defer PCP to consider outpatient anticoagulation if desired.  Very high fall risk at this time.  Low [place] vas 2 score.  History of breast cancer-s/p double mastectomy 4 years back in [place]: Maintained on Aromasin and Lupron-we will need to establish with outpatient oncology as  patient just moved into this area. Apparently has not taken Lupron in the past several months. [facility] has been sent.  Insomnia: Continue trazodone-added as needed Ambien-claims she finally slept well last night.  3 mm left upper lung nodule:Stable for outpatient follow-up with pulmonary.  2.7 cm left thyroid nodule:Stable for outpatient follow-up with endocrine-TSH within normal limits.  Abnormal appearance of salivary gland and imaging studies:No clinical features consistent with Sjogren's symptoms-doubt further work-up required while inpatient, PCP to arrange for outpatient rheumatology follow-up.  Discharge diagnosis     Principal Problem:   Syncope Active Problems:   Vertigo   Acute encephalopathy   Unspecified atrial fibrillation (HCC)   Headache   Thyroid nodule greater than or equal to 1.5 cm in diameter incidentally noted on imaging study   Constipation       History of Present Illness  04/02/2020  Pulmonary/ 1st office eval/ Melvyn Novas / [place] [facility]  No chief complaint on file.    Dyspnea:  *** Cough: *** Sleep: *** SABA use:   Past Medical History:  Diagnosis Date  . Arthritis   . Atrial fibrillation ([place])   . Cancer ([place])   . Thyroid nodule greater than or equal to 1.5 cm in diameter incidentally noted on imaging study 02/20/2020    Outpatient Medications Prior to Visit  Medication Sig Dispense Refill  . busPIRone (BUSPAR) 7.5 MG tablet Take 7.5 mg by mouth daily.    Marland Kitchen exemestane (AROMASIN) 25 MG tablet Take 1 tablet (25 mg total) by mouth daily after breakfast. 30 tablet 6  . ibuprofen (ADVIL) 800 MG tablet Take 800 mg by mouth every 8 (eight) hours as needed.    Marland Kitchen leuprolide (LUPRON) 7.5 MG injection Inject 7.5 mg into the muscle every 28 (twenty-eight) days.     Marland Kitchen LORazepam (ATIVAN PO) Take by mouth. (Patient not taking: Reported on 03/15/2020)    . meclizine (ANTIVERT) 25 MG tablet Take 1 tablet (25 mg total)  by mouth 3 (three) times daily as needed for dizziness. 30 tablet 0  . NONFORMULARY OR COMPOUNDED ITEM outpatient vestibular therapy. 1 each 0  . propranolol (INDERAL) 10 MG tablet Take 1 tablet (10 mg total) by mouth at bedtime. 30 tablet 0  . SUMAtriptan (IMITREX) 50 MG tablet Take 1 tablet (50 mg total) by mouth daily as needed for migraine. May repeat in 2 hours if headache persists or recurs. 15 tablet 2  . topiramate (TOPAMAX) 50 MG tablet Take 1 tablet (50 mg total) by mouth at bedtime. (Patient not taking: Reported on 03/15/2020) 30 tablet 0   No facility-administered medications prior to visit.     Objective:     There were no vitals taken for this visit.         Assessment   No problem-specific Assessment & Plan notes found for this encounter.     Christinia Gully, MD 04/02/2020

## 2020-05-08 ENCOUNTER — Other Ambulatory Visit: Payer: Self-pay

## 2020-05-08 ENCOUNTER — Emergency Department (HOSPITAL_COMMUNITY)
Admission: EM | Admit: 2020-05-08 | Discharge: 2020-05-09 | Disposition: A | Discharge status: Home / Self Care | Payer: Medicare Other | Attending: Emergency Medicine | Admitting: Emergency Medicine

## 2020-05-08 ENCOUNTER — Encounter (HOSPITAL_COMMUNITY): Payer: Self-pay | Admitting: *Deleted

## 2020-05-08 DIAGNOSIS — R350 Frequency of micturition: Secondary | ICD-10-CM | POA: Diagnosis not present

## 2020-05-08 DIAGNOSIS — N898 Other specified noninflammatory disorders of vagina: Secondary | ICD-10-CM | POA: Insufficient documentation

## 2020-05-08 DIAGNOSIS — R109 Unspecified abdominal pain: Secondary | ICD-10-CM

## 2020-05-08 DIAGNOSIS — Z79899 Other long term (current) drug therapy: Secondary | ICD-10-CM | POA: Diagnosis not present

## 2020-05-08 DIAGNOSIS — Z20822 Contact with and (suspected) exposure to covid-19: Secondary | ICD-10-CM | POA: Insufficient documentation

## 2020-05-08 DIAGNOSIS — Z853 Personal history of malignant neoplasm of breast: Secondary | ICD-10-CM | POA: Insufficient documentation

## 2020-05-08 DIAGNOSIS — R093 Abnormal sputum: Secondary | ICD-10-CM | POA: Diagnosis not present

## 2020-05-08 LAB — URINALYSIS, ROUTINE W REFLEX MICROSCOPIC
Bacteria, UA: NONE SEEN
Bilirubin Urine: NEGATIVE
Glucose, UA: NEGATIVE mg/dL
Hgb urine dipstick: NEGATIVE
Ketones, ur: NEGATIVE mg/dL
Nitrite: NEGATIVE
Protein, ur: NEGATIVE mg/dL
Specific Gravity, Urine: 1.011 (ref 1.005–1.030)
pH: 6 (ref 5.0–8.0)

## 2020-05-08 NOTE — ED Triage Notes (Signed)
Back pain with vaginal discharge

## 2020-05-09 ENCOUNTER — Emergency Department (HOSPITAL_COMMUNITY): Payer: Medicare Other

## 2020-05-09 DIAGNOSIS — R109 Unspecified abdominal pain: Secondary | ICD-10-CM | POA: Diagnosis not present

## 2020-05-09 LAB — SARS CORONAVIRUS 2 BY RT PCR (HOSPITAL ORDER, PERFORMED IN ~~LOC~~ HOSPITAL LAB): SARS Coronavirus 2: NEGATIVE

## 2020-05-09 LAB — PREGNANCY, URINE: Preg Test, Ur: NEGATIVE

## 2020-05-09 NOTE — ED Provider Notes (Signed)
[facility] EMERGENCY DEPARTMENT Provider Note   CSN: [id number] Arrival date & time: 05/08/20  1337   Time seen 12:47 AM  History Chief Complaint  Patient presents with  . Urinary Frequency    April Acevedo is a 45 y.o. female.  HPI   Triage note states patient is here for flank pain however she states she has had a cough with green sputum production for 4 days and green rhinorrhea for 3 days.  She has had chills with out fever.  She denies sore throat, chest pain, or shortness of breath.  She also states she has some low back ache left side worse than the right with green vaginal discharge without dysuria but with frequency.  She states her last sexual contact was over 6 months ago.  She denies being around anybody she knows is sick.  Patient states she took the [employer] Covid vaccine x2  PCP Patient, No Pcp Per   Past Medical History:  Diagnosis Date  . Arthritis   . Atrial fibrillation (HCC)   . Cancer (HCC)   . Thyroid nodule greater than or equal to 1.5 cm in diameter incidentally noted on imaging study 02/20/2020    Patient Active Problem List   Diagnosis Date Noted  . Breast cancer, stage 1, estrogen receptor positive, left (HCC) 03/15/2020  . Vertigo 02/20/2020  . Acute encephalopathy 02/20/2020  . Unspecified atrial fibrillation (HCC) 02/20/2020  . Headache 02/20/2020  . Thyroid nodule greater than or equal to 1.5 cm in diameter incidentally noted on imaging study 02/20/2020  . Constipation 02/20/2020  . Syncope 02/18/2020    Past Surgical History:  Procedure Laterality Date  . BREAST SURGERY Bilateral 2017   in [place]     OB History   No obstetric history on file.     No family history on file.  Social History   Tobacco Use  . Smoking status: Never Smoker  . Smokeless tobacco: Never Used  Vaping Use  . Vaping Use: Never used  Substance Use Topics  . Alcohol use: Yes  . Drug use: Never    Home Medications Prior to Admission  medications   Medication Sig Start Date End Date Taking? Authorizing Provider  busPIRone (BUSPAR) 7.5 MG tablet Take 7.5 mg by mouth daily.    [provider]  exemestane (AROMASIN) 25 MG tablet Take 1 tablet (25 mg total) by mouth daily after breakfast. 03/15/20   Doreatha Massed, MD  ibuprofen (ADVIL) 800 MG tablet Take 800 mg by mouth every 8 (eight) hours as needed.    [provider]  leuprolide (LUPRON) 7.5 MG injection Inject 7.5 mg into the muscle every 28 (twenty-eight) days.     [provider]  LORazepam (ATIVAN PO) Take by mouth. Patient not taking: Reported on 03/15/2020    [provider]  meclizine (ANTIVERT) 25 MG tablet Take 1 tablet (25 mg total) by mouth 3 (three) times daily as needed for dizziness. 02/29/20   Leroy Sea, MD  NONFORMULARY OR COMPOUNDED ITEM outpatient vestibular therapy. 02/26/20   Leroy Sea, MD  propranolol (INDERAL) 10 MG tablet Take 1 tablet (10 mg total) by mouth at bedtime. 03/01/20   Leroy Sea, MD  SUMAtriptan (IMITREX) 50 MG tablet Take 1 tablet (50 mg total) by mouth daily as needed for migraine. May repeat in 2 hours if headache persists or recurs. 02/29/20 03/01/21  Leroy Sea, MD  topiramate (TOPAMAX) 50 MG tablet Take 1 tablet (50 mg  total) by mouth at bedtime. Patient not taking: Reported on 03/15/2020 02/29/20   Leroy Sea, MD    Allergies    Patient has no known allergies.  Review of Systems   Review of Systems  All other systems reviewed and are negative.   Physical Exam Updated Vital Signs BP 102/68   Pulse 63   Temp 98.4 F (36.9 C) (Oral)   Resp 20   SpO2 98%   Physical Exam Vitals and nursing note reviewed.  Constitutional:      General: She is not in acute distress.    Appearance: Normal appearance. She is normal weight. She is not ill-appearing or toxic-appearing.  HENT:     Head: Normocephalic and atraumatic.     Right Ear: External ear  normal.     Left Ear: External ear normal.  Eyes:     Extraocular Movements: Extraocular movements intact.     Conjunctiva/sclera: Conjunctivae normal.     Pupils: Pupils are equal, round, and reactive to light.  Cardiovascular:     Rate and Rhythm: Normal rate.     Pulses: Normal pulses.     Heart sounds: Normal heart sounds.  Pulmonary:     Effort: Pulmonary effort is normal. No respiratory distress.     Breath sounds: Normal breath sounds.  Abdominal:     Tenderness: There is abdominal tenderness. There is right CVA tenderness and left CVA tenderness.     Comments: Mild suprapubic tenderness to palpation  Musculoskeletal:        General: Normal range of motion.     Cervical back: Normal range of motion.  Skin:    General: Skin is warm and dry.  Neurological:     General: No focal deficit present.     Mental Status: She is alert and oriented to person, place, and time.     Cranial Nerves: No cranial nerve deficit.  Psychiatric:        Mood and Affect: Mood normal.        Behavior: Behavior normal.        Thought Content: Thought content normal.     ED Results / Procedures / Treatments   Labs (all labs ordered are listed, but only abnormal results are displayed) Results for orders placed or performed during the hospital encounter of 05/08/20  Urinalysis, Routine w reflex microscopic  Result Value Ref Range   Color, Urine YELLOW YELLOW   APPearance CLEAR CLEAR   Specific Gravity, Urine 1.011 1.005 - 1.030   pH 6.0 5.0 - 8.0   Glucose, UA NEGATIVE NEGATIVE mg/dL   Hgb urine dipstick NEGATIVE NEGATIVE   Bilirubin Urine NEGATIVE NEGATIVE   Ketones, ur NEGATIVE NEGATIVE mg/dL   Protein, ur NEGATIVE NEGATIVE mg/dL   Nitrite NEGATIVE NEGATIVE   Leukocytes,Ua TRACE (A) NEGATIVE   WBC, UA 0-5 0 - 5 WBC/hpf   Bacteria, UA NONE SEEN NONE SEEN   Squamous Epithelial / LPF 0-5 0 - 5  Pregnancy, urine  Result Value Ref Range   Preg Test, Ur NEGATIVE NEGATIVE   Laboratory  interpretation all normal     EKG None  Radiology DG Chest Port 1 View  Result Date: 05/09/2020 CLINICAL DATA:  Initial evaluation for acute cough, chills. EXAM: PORTABLE CHEST 1 VIEW COMPARISON:  None available. FINDINGS: Cardiac and mediastinal silhouettes are within normal limits. Lungs normally inflated. Mild scattered peribronchial thickening/interstitial prominence noted within the mid and lower lungs, nonspecific, but could reflect changes of bronchiolitis/atypical pneumonitis. No focal  infiltrates or airspace consolidation. No edema or effusion. No pneumothorax. Mild irregular pleuroparenchymal thickening noted at the right lung apex. No acute osseous finding. IMPRESSION: Mild scattered peribronchial thickening/interstitial prominence within the mid and lower lungs, nonspecific, but could reflect changes of bronchiolitis/atypical pneumonitis. No focal infiltrates or airspace consolidation to suggest frank bronchopneumonia. Electronically Signed   By: Jeannine Boga M.D.   On: 05/09/2020 03:09   DG Abd Portable 1 View  Result Date: 05/09/2020 CLINICAL DATA:  Initial evaluation for acute left flank pain. Question renal stone. EXAM: PORTABLE ABDOMEN - 1 VIEW COMPARISON:  Prior radiograph from 02/21/2020. FINDINGS: Bowel gas pattern within normal limits without evidence for ileus or obstruction. Moderate retained stool within the colon. No abnormal bowel wall thickening. Few subcentimeter calcific densities overlying the right hemipelvis, stable from previous, in felt to be most consistent with vascular phleboliths. Few punctate 2 mm calcific densities overlie the left renal shadow, which could reflect nephrolithiasis. No other visible radiopaque calculi identified. Osseous structures demonstrate no acute finding. IMPRESSION: 1. Two punctate 2 mm calcific densities overlying the left renal shadow, suspicious for possible nephrolithiasis. No visible ureterolithiasis by radiography. 2.  Nonobstructive bowel gas pattern. Electronically Signed   By: Jeannine Boga M.D.   On: 05/09/2020 03:14    Procedures Procedures (including critical care time)  Medications Ordered in ED Medications - No data to display  ED Course  I have reviewed the triage vital signs and the nursing notes.  Pertinent labs & imaging results that were available during my care of the patient were reviewed by me and considered in my medical decision making (see chart for details).    MDM Rules/Calculators/A&P                          Chest x-ray and abdominal x-ray were done to evaluate patient's complaints of coughing and flank pain.  I suspect however of her symptoms she most likely has Covid which is rampant in her community right now.  A lot of these patients are complaining of low back pain.  Her urine was not suggestive of UTI, urine culture was sent.    Final Clinical Impression(s) / ED Diagnoses Final diagnoses:  Suspected 2019 novel coronavirus infection  Bilateral flank pain    Rx / DC Orders ED Discharge Orders    None     Plan discharge  Rolland Porter, MD, Barbette Or, MD 05/09/20 [phone]

## 2020-05-09 NOTE — Discharge Instructions (Addendum)
Drink plenty of fluids.  Take acetaminophen 650 mg plus ibuprofen 600 mg every 6 hours as needed for body aches or pain.  If your Covid test is positive start taking zinc 50 mg once a day, vitamin D and vitamin C and aspirin 81 mg daily.  You should be reevaluated if you start struggling to breathe.  You can check the results of your tests for your vaginal discharge and your Covid test in MyChart later today.  Your urine culture result will probably be ready in the next 2 days.

## 2020-05-10 LAB — GC/CHLAMYDIA PROBE AMP (~~LOC~~) NOT AT ARMC
Chlamydia: NEGATIVE
Comment: NEGATIVE
Comment: NORMAL
Neisseria Gonorrhea: NEGATIVE

## 2020-05-10 LAB — URINE CULTURE: Culture: <10000 — AB

## 2020-06-11 ENCOUNTER — Encounter: Payer: Medicare Other | Admitting: Obstetrics & Gynecology

## 2020-06-14 ENCOUNTER — Ambulatory Visit (INDEPENDENT_AMBULATORY_CARE_PROVIDER_SITE_OTHER): Payer: Medicare Other | Admitting: Obstetrics & Gynecology

## 2020-06-14 ENCOUNTER — Encounter: Payer: Self-pay | Admitting: Obstetrics & Gynecology

## 2020-06-14 ENCOUNTER — Other Ambulatory Visit: Payer: Self-pay

## 2020-06-14 VITALS — BP 108/62 | HR 78 | Ht 64.0 in | Wt 131.4 lb

## 2020-06-14 DIAGNOSIS — Z170 Estrogen receptor positive status [ER+]: Secondary | ICD-10-CM

## 2020-06-14 DIAGNOSIS — C50912 Malignant neoplasm of unspecified site of left female breast: Secondary | ICD-10-CM | POA: Diagnosis not present

## 2020-06-14 NOTE — Progress Notes (Signed)
Preoperative History and Physical  April Acevedo is a 45 y.o. [phone] with Patient's last menstrual period was 03/19/2020 (approximate). admitted for a laparoscopic removal of both ovaries due to estrogen receptor positive breast cancer and continued ovarian estrogen production.    PMH:    Past Medical History:  Diagnosis Date  . Arthritis   . Atrial fibrillation ([place])   . Cancer ([place])   . Thyroid nodule greater than or equal to 1.5 cm in diameter incidentally noted on imaging study 02/20/2020    PSH:     Past Surgical History:  Procedure Laterality Date  . BREAST SURGERY Bilateral 2017   in [place]    POb/GynH:      OB History    Gravida  2   Para  2   Term  0   Preterm  2   AB  0   Living  2     SAB  0   IAB  0   Ectopic  0   Multiple  0   Live Births  2           SH:   Social History   Tobacco Use  . Smoking status: Never Smoker  . Smokeless tobacco: Never Used  Vaping Use  . Vaping Use: Never used  Substance Use Topics  . Alcohol use: Yes  . Drug use: Never    FH:    Family History  Problem Relation Age of Onset  . Parkinson's disease Paternal Grandfather   . Colon cancer Paternal Grandmother   . Kidney disease Maternal Grandmother   . Ovarian cancer Maternal Grandmother   . Aneurysm Maternal Grandfather   . Suicidality Father   . Diabetes Mother   . Hyperthyroidism Mother   . Aneurysm Brother   . Colon cancer Sister      Allergies: No Known Allergies  Medications:       Current Outpatient Medications:  .  busPIRone (BUSPAR) 7.5 MG tablet, Take 7.5 mg by mouth daily., Disp: , Rfl:  .  exemestane (AROMASIN) 25 MG tablet, Take 1 tablet (25 mg total) by mouth daily after breakfast., Disp: 30 tablet, Rfl: 6 .  ibuprofen (ADVIL) 800 MG tablet, Take 800 mg by mouth every 8 (eight) hours as needed., Disp: , Rfl:  .  leuprolide (LUPRON) 7.5 MG injection, Inject 7.5 mg into the muscle every 3 (three) months., Disp: , Rfl:  .   LORazepam (ATIVAN PO), Take by mouth as needed., Disp: , Rfl:  .  meclizine (ANTIVERT) 25 MG tablet, Take 1 tablet (25 mg total) by mouth 3 (three) times daily as needed for dizziness., Disp: 30 tablet, Rfl: 0 .  NONFORMULARY OR COMPOUNDED ITEM, outpatient vestibular therapy., Disp: 1 each, Rfl: 0 .  SUMAtriptan (IMITREX) 50 MG tablet, Take 1 tablet (50 mg total) by mouth daily as needed for migraine. May repeat in 2 hours if headache persists or recurs., Disp: 15 tablet, Rfl: 2 .  topiramate (TOPAMAX) 50 MG tablet, Take 1 tablet (50 mg total) by mouth at bedtime., Disp: 30 tablet, Rfl: 0 .  propranolol (INDERAL) 10 MG tablet, Take 1 tablet (10 mg total) by mouth at bedtime. (Patient not taking: Reported on 06/14/2020), Disp: 30 tablet, Rfl: 0  Review of Systems:   Review of Systems  Constitutional: Negative for fever, chills, weight loss, malaise/fatigue and diaphoresis.  HENT: Negative for hearing loss, ear pain, nosebleeds, congestion, sore throat, neck pain, tinnitus and ear discharge.   Eyes: Negative for  blurred vision, double vision, photophobia, pain, discharge and redness.  Respiratory: Negative for cough, hemoptysis, sputum production, shortness of breath, wheezing and stridor.   Cardiovascular: Negative for chest pain, palpitations, orthopnea, claudication, leg swelling and PND.  Gastrointestinal: Positive for abdominal pain. Negative for heartburn, nausea, vomiting, diarrhea, constipation, blood in stool and melena.  Genitourinary: Negative for dysuria, urgency, frequency, hematuria and flank pain.  Musculoskeletal: Negative for myalgias, back pain, joint pain and falls.  Skin: Negative for itching and rash.  Neurological: Negative for dizziness, tingling, tremors, sensory change, speech change, focal weakness, seizures, loss of consciousness, weakness and headaches.  Endo/Heme/Allergies: Negative for environmental allergies and polydipsia. Does not bruise/bleed easily.   Psychiatric/Behavioral: Negative for depression, suicidal ideas, hallucinations, memory loss and substance abuse. The patient is not nervous/anxious and does not have insomnia.      PHYSICAL EXAM:  Blood pressure 108/62, pulse 78, height 5\' 4"  (1.626 m), weight 131 lb 6.4 oz (59.6 kg), last menstrual period 03/19/2020.    Vitals reviewed. Constitutional: She is oriented to person, place, and time. She appears well-developed and well-nourished.  HENT:  Head: Normocephalic and atraumatic.  Right Ear: External ear normal.  Left Ear: External ear normal.  Nose: Nose normal.  Mouth/Throat: Oropharynx is clear and moist.  Eyes: Conjunctivae and EOM are normal. Pupils are equal, round, and reactive to light. Right eye exhibits no discharge. Left eye exhibits no discharge. No scleral icterus.  Neck: Normal range of motion. Neck supple. No tracheal deviation present. No thyromegaly present.  Cardiovascular: Normal rate, regular rhythm, normal heart sounds and intact distal pulses.  Exam reveals no gallop and no friction rub.   No murmur heard. Respiratory: Effort normal and breath sounds normal. No respiratory distress. She has no wheezes. She has no rales. She exhibits no tenderness.  S/P bilateral mastectomy GI: Soft. Bowel sounds are normal. She exhibits no distension and no mass. There is tenderness. There is no rebound and no guarding.  Genitourinary:       Vulva is normal without lesions Vagina is pink moist without discharge Cervix normal in appearance and pap is normal Uterus is normal size, contour, position, consistency, mobility, non-tender Adnexa is negative with normal sized ovaries by sonogram  Musculoskeletal: Normal range of motion. She exhibits no edema and no tenderness.  Neurological: She is alert and oriented to person, place, and time. She has normal reflexes. She displays normal reflexes. No cranial nerve deficit. She exhibits normal muscle tone. Coordination normal.   Skin: Skin is warm and dry. No rash noted. No erythema. No pallor.  Psychiatric: She has a normal mood and affect. Her behavior is normal. Judgment and thought content normal.    Labs: No results found for this or any previous visit (from the past 336 hour(s)).  EKG: Orders placed or performed during the hospital encounter of 02/18/20  . EKG 12-Lead  . EKG 12-Lead  . ED EKG  . ED EKG  . EKG  . EKG 12-Lead  . EKG 12-Lead  . EKG 12-Lead  . EKG 12-Lead    Imaging Studies: No results found.    Assessment: Prophylactic ovarian removal, laparoscopic, due to estrogen receptor positive breast cancer and continued ovarian estrogen production   Patient Active Problem List   Diagnosis Date Noted  . Breast cancer, stage 1, estrogen receptor positive, left ([facility]) 03/15/2020  . Vertigo 02/20/2020  . Acute encephalopathy 02/20/2020  . Unspecified atrial fibrillation ([place]) 02/20/2020  . Headache 02/20/2020  . Thyroid nodule greater  than or equal to 1.5 cm in diameter incidentally noted on imaging study 02/20/2020  . Constipation 02/20/2020  . Syncope 02/18/2020    Plan: Laparoscopic removal of both ovries 08/01/20  Mertie Clause Eure 06/14/2020 2:22 PM      Face to face time:  20 minutes  Greater than 50% of the visit time was spent in counseling and coordination of care with the patient.  The summary and outline of the counseling and care coordination is summarized in the note above.   All questions were answered.

## 2020-06-18 ENCOUNTER — Encounter (HOSPITAL_COMMUNITY): Payer: Self-pay | Admitting: Hematology

## 2020-06-18 ENCOUNTER — Other Ambulatory Visit: Payer: Self-pay

## 2020-06-18 ENCOUNTER — Encounter (HOSPITAL_COMMUNITY): Payer: Self-pay

## 2020-06-18 ENCOUNTER — Inpatient Hospital Stay (HOSPITAL_COMMUNITY): Payer: Medicare Other

## 2020-06-18 ENCOUNTER — Inpatient Hospital Stay (HOSPITAL_COMMUNITY): Payer: Medicare Other | Attending: Hematology

## 2020-06-18 ENCOUNTER — Inpatient Hospital Stay (HOSPITAL_BASED_OUTPATIENT_CLINIC_OR_DEPARTMENT_OTHER): Payer: Medicare Other | Admitting: Hematology

## 2020-06-18 VITALS — BP 100/69 | HR 58 | Temp 97.1°F | Resp 18 | Wt 131.8 lb

## 2020-06-18 DIAGNOSIS — Z170 Estrogen receptor positive status [ER+]: Secondary | ICD-10-CM

## 2020-06-18 DIAGNOSIS — R911 Solitary pulmonary nodule: Secondary | ICD-10-CM | POA: Insufficient documentation

## 2020-06-18 DIAGNOSIS — C50912 Malignant neoplasm of unspecified site of left female breast: Secondary | ICD-10-CM | POA: Diagnosis present

## 2020-06-18 DIAGNOSIS — Z79818 Long term (current) use of other agents affecting estrogen receptors and estrogen levels: Secondary | ICD-10-CM | POA: Diagnosis not present

## 2020-06-18 DIAGNOSIS — K4090 Unilateral inguinal hernia, without obstruction or gangrene, not specified as recurrent: Secondary | ICD-10-CM | POA: Insufficient documentation

## 2020-06-18 DIAGNOSIS — Z9012 Acquired absence of left breast and nipple: Secondary | ICD-10-CM | POA: Diagnosis not present

## 2020-06-18 DIAGNOSIS — Z79899 Other long term (current) drug therapy: Secondary | ICD-10-CM | POA: Insufficient documentation

## 2020-06-18 DIAGNOSIS — Z79811 Long term (current) use of aromatase inhibitors: Secondary | ICD-10-CM | POA: Insufficient documentation

## 2020-06-18 DIAGNOSIS — F32A Depression, unspecified: Secondary | ICD-10-CM | POA: Insufficient documentation

## 2020-06-18 LAB — COMPREHENSIVE METABOLIC PANEL
ALT: 15 U/L (ref 0–44)
AST: 24 U/L (ref 15–41)
Albumin: 4 g/dL (ref 3.5–5.0)
Alkaline Phosphatase: 57 U/L (ref 38–126)
Anion gap: 5 (ref 5–15)
BUN: 12 mg/dL (ref 6–20)
CO2: 25 mmol/L (ref 22–32)
Calcium: 8.9 mg/dL (ref 8.9–10.3)
Chloride: 104 mmol/L (ref 98–111)
Creatinine, Ser: 0.84 mg/dL (ref 0.44–1.00)
GFR, Estimated: >60 mL/min (ref >60)
Glucose, Bld: 98 mg/dL (ref 70–99)
Potassium: 3.7 mmol/L (ref 3.5–5.1)
Sodium: 134 mmol/L — ABNORMAL LOW (ref 135–145)
Total Bilirubin: 0.7 mg/dL (ref 0.3–1.2)
Total Protein: 8.8 g/dL — ABNORMAL HIGH (ref 6.5–8.1)

## 2020-06-18 LAB — CBC WITH DIFFERENTIAL/PLATELET
Abs Immature Granulocytes: 0 10*3/uL (ref 0.00–0.07)
Basophils Absolute: 0 10*3/uL (ref 0.0–0.1)
Basophils Relative: 1 %
Eosinophils Absolute: 0 10*3/uL (ref 0.0–0.5)
Eosinophils Relative: 1 %
HCT: 41 % (ref 36.0–46.0)
Hemoglobin: 12.6 g/dL (ref 12.0–15.0)
Immature Granulocytes: 0 %
Lymphocytes Relative: 42 %
Lymphs Abs: 1.2 10*3/uL (ref 0.7–4.0)
MCH: 26.4 pg (ref 26.0–34.0)
MCHC: 30.7 g/dL (ref 30.0–36.0)
MCV: 86 fL (ref 80.0–100.0)
Monocytes Absolute: 0.3 10*3/uL (ref 0.1–1.0)
Monocytes Relative: 10 %
Neutro Abs: 1.3 10*3/uL — ABNORMAL LOW (ref 1.7–7.7)
Neutrophils Relative %: 46 %
Platelets: 200 10*3/uL (ref 150–400)
RBC: 4.77 MIL/uL (ref 3.87–5.11)
RDW: 12.8 % (ref 11.5–15.5)
WBC: 2.7 10*3/uL — ABNORMAL LOW (ref 4.0–10.5)
nRBC: 0 % (ref 0.0–0.2)

## 2020-06-18 LAB — VITAMIN D 25 HYDROXY (VIT D DEFICIENCY, FRACTURES): Vit D, 25-Hydroxy: 42.23 ng/mL (ref 30–100)

## 2020-06-18 MED ORDER — LEUPROLIDE ACETATE (3 MONTH) 11.25 MG IM KIT
11.2500 mg | PACK | Freq: Once | INTRAMUSCULAR | Status: AC
  Administered 2020-06-18: 11.25 mg via INTRAMUSCULAR
  Filled 2020-06-18: qty 11.25

## 2020-06-18 MED ORDER — CALCIUM CARBONATE-VITAMIN D 500-200 MG-UNIT PO TABS: 1.0000 | ORAL_TABLET | Freq: Two times a day (BID) | ORAL | 6 refills | Status: DC

## 2020-06-18 MED ORDER — IBUPROFEN 800 MG PO TABS: 800.0000 mg | ORAL_TABLET | Freq: Three times a day (TID) | ORAL | 6 refills | Status: DC | PRN

## 2020-06-18 MED ORDER — BUSPIRONE HCL 7.5 MG PO TABS: 7.5000 mg | ORAL_TABLET | Freq: Every day | ORAL | 6 refills | Status: DC

## 2020-06-18 NOTE — Patient Instructions (Signed)
[place] [facility] Discharge Instructions  You were seen today by Dr. Delton Coombes. He went over your recent results. You received your Lupron injection today. You had labs drawn today. You will be prescribed calcium and vitamin D to take to strengthen your bones. You will be prescribed Buspar, but make an appointment to see a psychiatrist to manage your depression. You may proceed with your surgery with Dr. Etter Sjogren. Delton Coombes will see you back in 3 months for labs and follow up.   Thank you for choosing [place] at [facility] to provide your oncology and hematology care.  To afford each patient quality time with our provider, please arrive at least 15 minutes before your scheduled appointment time.   If you have a lab appointment with the [facility] please come in thru the Main Entrance and check in at the main information desk  You need to re-schedule your appointment should you arrive 10 or more minutes late.  We strive to give you quality time with our providers, and arriving late affects you and other patients whose appointments are after yours.  Also, if you no show three or more times for appointments you may be dismissed from the clinic at the providers discretion.     Again, thank you for choosing [facility].  Our hope is that these requests will decrease the amount of time that you wait before being seen by our physicians.       _____________________________________________________________  Should you have questions after your visit to [facility], please contact our office at [phone] between the hours of 8:00 a.m. and 4:30 p.m.  Voicemails left after 4:00 p.m. will not be returned until the following business day.  For prescription refill requests, have your pharmacy contact our office and allow 72 hours.    Cancer Center Support Programs:   > Cancer Support Group  2nd Tuesday of the month  1pm-2pm, Journey Room

## 2020-06-18 NOTE — Progress Notes (Signed)
Patient tolerated Lupron injection with no complaints voiced.  Site clean and dry with no bruising or swelling noted at site.  Band aid applied.  Vss with discharge and left in satisfactory condition with no s/s of distress noted.

## 2020-06-18 NOTE — Progress Notes (Signed)
Patient was assessed by Dr. Delton Coombes and labs have been reviewed.  Patient is okay to proceed with Lupron injection. Primary RN and pharmacy aware.

## 2020-06-18 NOTE — Progress Notes (Signed)
April Acevedo, April Acevedo   Patient Care Team: Patient, No Pcp Per as PCP - General (General Practice) April Mates, RN as Oncology Nurse Navigator (Oncology)  SUMMARY OF ONCOLOGIC HISTORY: Oncology History  Breast cancer, stage 1, estrogen receptor positive, left ([place])  03/15/2020 Initial Diagnosis   Breast cancer, stage 1, estrogen receptor positive, left ([place])   03/15/2020 Cancer Staging   Staging form: Breast, AJCC 8th Edition - Clinical stage from 03/15/2020: Stage IA (cT1c, cN0, cM0, G2, ER+, PR+, HER2-) - Signed by Derek Jack, MD on 03/15/2020     CHIEF COMPLIANT: Follow-up for left breast cancer   INTERVAL HISTORY: Ms. April Acevedo is a 45 y.o. female here today for follow up of her left breast cancer. Her last visit was on 03/15/2020.   Today she reports feeling okay. She complains of having monthly pain with throbbing over her right ovary which has worsened to sharp pain and can feel a lump over the area. She is taking Aromasin and tolerating it well; she denies having any hot flashes or night sweats. Her depression is still present and she is taking Buspar (ran out recently), but she has not gone to see the psychiatrist yet. She is not taking calcium or vitamin D. She continues having spotting with occasional vaginal discharge.  She is scheduled to have a laparoscopic BSO with Dr. Elonda Husky on 03/30.   REVIEW OF SYSTEMS:   Review of Systems  Constitutional: Positive for appetite change (65%). Negative for diaphoresis and fatigue.  Respiratory: Positive for shortness of breath.   Cardiovascular: Positive for palpitations.  Gastrointestinal: Positive for abdominal pain (sharp RLQ pain).  Endocrine: Negative for hot flashes.  Genitourinary: Positive for menstrual problem (spotting) and vaginal discharge (occasional).   Neurological: Positive for headaches.  Psychiatric/Behavioral: Positive for depression (on  Buspar) and sleep disturbance. The patient is nervous/anxious.   All other systems reviewed and are negative.   I have reviewed the past medical history, past surgical history, social history and family history with the patient and they are unchanged from previous note.   ALLERGIES:   has No Known Allergies.   MEDICATIONS:  Current Outpatient Medications  Medication Sig Dispense Refill  . calcium-vitamin D (OSCAL WITH D) 500-200 MG-UNIT tablet Take 1 tablet by mouth 2 (two) times daily. 60 tablet 6  . busPIRone (BUSPAR) 7.5 MG tablet Take 1 tablet (7.5 mg total) by mouth daily. 30 tablet 6  . exemestane (AROMASIN) 25 MG tablet Take 1 tablet (25 mg total) by mouth daily after breakfast. 30 tablet 6  . ibuprofen (ADVIL) 800 MG tablet Take 1 tablet (800 mg total) by mouth every 8 (eight) hours as needed. 30 tablet 6  . leuprolide (LUPRON) 7.5 MG injection Inject 7.5 mg into the muscle every 3 (three) months.    Marland Kitchen LORazepam (ATIVAN PO) Take 0.5 mg by mouth as needed.    . meclizine (ANTIVERT) 25 MG tablet Take 1 tablet (25 mg total) by mouth 3 (three) times daily as needed for dizziness. 30 tablet 0  . NONFORMULARY OR COMPOUNDED ITEM outpatient vestibular therapy. 1 each 0  . propranolol (INDERAL) 10 MG tablet Take 1 tablet (10 mg total) by mouth at bedtime. (Patient not taking: Reported on 06/14/2020) 30 tablet 0  . SUMAtriptan (IMITREX) 50 MG tablet Take 1 tablet (50 mg total) by mouth daily as needed for migraine. May repeat in 2 hours if headache persists or recurs. 15 tablet 2  .  topiramate (TOPAMAX) 50 MG tablet Take 1 tablet (50 mg total) by mouth at bedtime. 30 tablet 0   No current facility-administered medications for this visit.     PHYSICAL EXAMINATION: Performance status (ECOG): 0 - Asymptomatic  Vitals:   06/18/20 1128  BP: 100/69  Pulse: (!) 58  Resp: 18  Temp: (!) 97.1 F (36.2 C)  SpO2: 100%   Wt Readings from Last 3 Encounters:  06/18/20 131 lb 12.8 oz (59.8  kg)  06/14/20 131 lb 6.4 oz (59.6 kg)  03/15/20 125 lb 8 oz (56.9 kg)   Physical Exam Vitals reviewed.  Constitutional:      Appearance: Normal appearance.  Cardiovascular:     Rate and Rhythm: Normal rate and regular rhythm.     Pulses: Normal pulses.     Heart sounds: Normal heart sounds.  Pulmonary:     Effort: Pulmonary effort is normal.     Breath sounds: Normal breath sounds.  Abdominal:     Tenderness: There is no abdominal tenderness.     Hernia: A hernia is present. Hernia is present in the right inguinal area.  Neurological:     General: No focal deficit present.     Mental Status: She is alert and oriented to person, place, and time.  Psychiatric:        Mood and Affect: Mood normal.        Behavior: Behavior normal.     Breast Exam Chaperone: Milinda Antis, MD     LABORATORY DATA:  I have reviewed the data as listed CMP Latest Ref Rng & Units 02/28/2020 02/25/2020 02/24/2020  Glucose 70 - 99 mg/dL 94 105(H) 128(H)  BUN 6 - 20 mg/dL 21(H) 17 13  Creatinine 0.44 - 1.00 mg/dL 0.99 0.93 0.91  Sodium 135 - 145 mmol/L 137 139 138  Potassium 3.5 - 5.1 mmol/L 3.8 3.7 3.9  Chloride 98 - 111 mmol/L 108 106 105  CO2 22 - 32 mmol/L $RemoveB'23 25 23  'cwiHBKku$ Calcium 8.9 - 10.3 mg/dL 9.3 9.0 9.5  Total Protein 6.5 - 8.1 g/dL 7.3 7.7 -  Total Bilirubin 0.3 - 1.2 mg/dL 0.5 0.4 -  Alkaline Phos 38 - 126 U/L 45 41 -  AST 15 - 41 U/L 19 40 -  ALT 0 - 44 U/L 27 35 -   No results found for: OEH212 Lab Results  Component Value Date   WBC 5.5 02/28/2020   HGB 12.9 02/28/2020   HCT 40.3 02/28/2020   MCV 82.6 02/28/2020   PLT 178 02/28/2020   NEUTROABS 2.5 02/28/2020    ASSESSMENT:  1.  T1CN0 left breast IDC, ER/PR positive, HER-2 negative: -Bilateral nipple sparing maste[facility] on 10/12/[facility] [place]. -Pathology showing 1.8 cm IDC, margins negative, grade 2, ER/PR positive, HER-2 negative, PT1CPN0. -Oncotype DX recurrence score of 21 -Treated with  tamoxifen for a year, Lupron was added during year 2, tamoxifen continued for a total of 3 years. -Tamoxifen changed to Aromasin, Lupron monthly continued. -She recently moved to [place].  She did not receive Lupron injection for 2 months. -Genetic testing was negative for BRCA 1 and 2 -Ultrasound on 06/09/2019 in [place] showed left axillary lymph node. -She was recently hospitalized for syncopal episode.  CT angio of the head and neck on 02/18/2020 showed 3 mm left upper lobe lung nodule. -Brain MRI on 02/19/2020 without evidence of metastatic disease.  2.  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  She  lives with her son. -Paternal uncle had prostate cancer.  Paternal grandmother died of colon cancer at age 73.  Maternal cousin had ovarian cancer.  3.  Bone health: -DEXA scan on 04/26/2019 with T score of -1.6.   PLAN:  1.  T1CN0 left breast cancer: -She was evaluated by Dr. Elonda Husky and oophorectomy being considered end of March. -I have reviewed her labs.  LFTs are normal. -Continue Aromasin.  She will receive Lupron last injection today. -She reported feeling a small lump in the right groin region.  Likely inguinal hernia. -We will follow up on 3 mm left lung nodule incidentally found on CT angio head on 02/18/2020 with repeat imaging in 12 months. -RTC 3 months with labs.  2.  Bone health: -We checked vitamin D level which was 42 today. -Continue calcium and vitamin D intake.   Breast Cancer therapy associated bone loss: I have recommended calcium, Vitamin D and weight bearing exercises.    Orders Placed This Encounter  Procedures  . CBC with Differential/Platelet    Standing Status:   Future    Standing Expiration Date:   06/18/2021    Order Specific Question:   Release to patient    Answer:   Immediate  . Comprehensive metabolic panel    Standing Status:   Future    Standing Expiration Date:   06/18/2021    Order Specific Question:    Release to patient    Answer:   Immediate  . VITAMIN D 25 Hydroxy (Vit-D Deficiency, Fractures)    Standing Status:   Future    Standing Expiration Date:   06/18/2021    Order Specific Question:   Release to patient    Answer:   Immediate  . Cancer antigen 15-3    Standing Status:   Future    Standing Expiration Date:   06/18/2021  . Cancer antigen 27.29    Standing Status:   Future    Standing Expiration Date:   06/18/2021  . CBC with Differential/Platelet    Standing Status:   Future    Standing Expiration Date:   06/18/2021    Order Specific Question:   Release to patient    Answer:   Immediate  . Comprehensive metabolic panel    Standing Status:   Future    Standing Expiration Date:   06/18/2021    Order Specific Question:   Release to patient    Answer:   Immediate   The patient has a good understanding of the overall plan. she agrees with it. she will call with any problems that may develop before the next visit here.    Derek Jack, MD [place] [phone]   I, Milinda Antis, am acting as a scribe for Dr. Sanda Linger.  I, Derek Jack MD, have reviewed the above documentation for accuracy and completeness, and I agree with the above.

## 2020-06-18 NOTE — Progress Notes (Signed)
Pt is taking Aromasin as prescribed with no side effects.

## 2020-06-19 LAB — CANCER ANTIGEN 27.29: CA 27.29: 29.4 U/mL (ref 0.0–38.6)

## 2020-06-19 LAB — CANCER ANTIGEN 15-3: CA 15-3: 24 U/mL (ref 0.0–25.0)

## 2020-07-27 NOTE — Patient Instructions (Signed)
April Acevedo  07/27/2020     @PREFPERIOPPHARMACY @   Your procedure is scheduled on  08/01/2020   Report to [facility] at  [email or web address].   Call this number if you have problems the morning of surgery:  [phone]   Remember:  Do not eat or drink after midnight.                        Take these medicines the morning of surgery with A SIP OF WATER  Buspar, aromasin, ativan (if needed), imitrex (if needed).     Place clean sheets on your bed the night before your procedure and DO NOT sleep with pets this night.  Shower with CHG the night before and the morning of your procedure. DO NOT put CHG on your face, hair or genitals.  After each shower, dry off with a clean towel, put on clean, comfortable clothes and brush your teeth.      Do not wear jewelry, make-up or nail polish.  Do not wear lotions, powders, or perfumes, or deodorant.  Do not shave 48 hours prior to surgery.  Men may shave face and neck.  Do not bring valuables to the hospital.  [facility] is not responsible for any belongings or valuables.  Contacts, dentures or bridgework may not be worn into surgery.  Leave your suitcase in the car.  After surgery it may be brought to your room.  For patients admitted to the hospital, discharge time will be determined by your treatment team.  Patients discharged the day of surgery will not be allowed to drive home and must have someone wit them for 24 hours.   Special instructions:  DO NOT smoke tobacco or vape the morning of your procedure.   Please read over the following fact sheets that you were given. Coughing and Deep Breathing, Surgical Site Infection Prevention, Anesthesia Post-op Instructions and Care and Recovery After Surgery       Bilateral Salpingo-Oophorectomy, Care After This sheet gives you information about how to care for yourself after your procedure. Your health care provider may also give you more specific instructions. If  you have problems or questions, contact your health care provider. What can I expect after the procedure? After the procedure, it is common to have:  Abdominal pain.  Some occasional vaginal bleeding (spotting).  Tiredness.  Symptoms of menopause, such as hot flashes, night sweats, or mood swings. Follow these instructions at home: Incision care  Keep your incision area and your bandage (dressing) clean and dry.  Follow instructions from your health care provider about how to take care of your incision. Make sure you: ? Wash your hands with soap and water before you change your dressing. If soap and water are not available, use hand sanitizer. ? Change your dressing as told by your health care provider. ? Leave stitches (sutures), staples, skin glue, or adhesive strips in place. These skin closures may need to stay in place for 2 weeks or longer. If adhesive strip edges start to loosen and curl up, you may trim the loose edges. Do not remove adhesive strips completely unless your health care provider tells you to do that.  Check your incision area every day for signs of infection. Check for: ? Redness, swelling, or pain. ? Fluid or blood. ? Warmth. ? Pus or a bad smell.   Activity  Do not drive or use heavy machinery  while taking prescription pain medicine.  Do not drive for 24 hours if you received a medicine to help you relax (sedative) during your procedure.  Take frequent, short walks throughout the day. Rest when you get tired. Ask your health care provider what activities are safe for you.  Avoid activity that requires great effort. Also, avoid heavy lifting. Do not lift anything that is heavier than 10 lbs. (4.5 kg), or the limit that your health care provider tells you, until he or she says that it is safe to do so.  Do not douche, use tampons, or have sex until your health care provider approves.   General instructions  To prevent or treat constipation while you are  taking prescription pain medicine, your health care provider may recommend that you: ? Drink enough fluid to keep your urine clear or pale yellow. ? Take over-the-counter or prescription medicines. ? Eat foods that are high in fiber, such as fresh fruits and vegetables, whole grains, and beans. ? Limit foods that are high in fat and processed sugars, such as fried and sweet foods.  Take over-the-counter and prescription medicines only as told by your health care provider.  Do not take baths, swim, or use a hot tub until your health care provider approves. Ask your health care provider if you can take showers. You may only be allowed to take sponge baths for bathing.  Wear compression stockings as told by your health care provider. These stockings help to prevent blood clots and reduce swelling in your legs.  Keep all follow-up visits as told by your health care provider. This is important.   Contact a health care provider if:  You have pain when you urinate.  You have pus or a bad smelling discharge coming from your vagina.  You have redness, swelling, or pain around your incision.  You have fluid or blood coming from your incision.  Your incision feels warm to the touch.  You have pus or a bad smell coming from your incision.  You have a fever.  Your incision starts to break open.  You have pain in the abdomen, and it gets worse or does not get better when you take medicine.  You develop a rash.  You develop nausea and vomiting.  You feel lightheaded. Get help right away if:  You develop pain in your chest or leg.  You become short of breath.  You faint.  You have increased bleeding from your vagina. Summary  After the procedure, it is common to have pain, bleeding in the vagina, and symptoms of menopause.  Follow instructions from your health care provider about how to take care of your incision.  Follow instructions from your health care provider about  activities and restrictions.  Check your incision every day for signs of infection and report any symptoms to your health care provider. This information is not intended to replace advice given to you by your health care provider. Make sure you discuss any questions you have with your health care provider. Document Revised: 06/25/2018 Document Reviewed: 05/26/2016 Elsevier Patient Education  2021 [place] Anesthesia, Adult, Care After This sheet gives you information about how to care for yourself after your procedure. Your health care provider may also give you more specific instructions. If you have problems or questions, contact your health care provider. What can I expect after the procedure? After the procedure, the following side effects are common:  Pain or discomfort at the IV site.  Nausea.  Vomiting.  Sore throat.  Trouble concentrating.  Feeling cold or chills.  Feeling weak or tired.  Sleepiness and fatigue.  Soreness and body aches. These side effects can affect parts of the body that were not involved in surgery. Follow these instructions at home: For the time period you were told by your health care provider:  Rest.  Do not participate in activities where you could fall or become injured.  Do not drive or use machinery.  Do not drink alcohol.  Do not take sleeping pills or medicines that cause drowsiness.  Do not make important decisions or sign legal documents.  Do not take care of children on your own.   Eating and drinking  Follow any instructions from your health care provider about eating or drinking restrictions.  When you feel hungry, start by eating small amounts of foods that are soft and easy to digest (bland), such as toast. Gradually return to your regular diet.  Drink enough fluid to keep your urine pale yellow.  If you vomit, rehydrate by drinking water, juice, or clear broth. General instructions  If you have sleep  apnea, surgery and certain medicines can increase your risk for breathing problems. Follow instructions from your health care provider about wearing your sleep device: ? Anytime you are sleeping, including during daytime naps. ? While taking prescription pain medicines, sleeping medicines, or medicines that make you drowsy.  Have a responsible adult stay with you for the time you are told. It is important to have someone help care for you until you are awake and alert.  Return to your normal activities as told by your health care provider. Ask your health care provider what activities are safe for you.  Take over-the-counter and prescription medicines only as told by your health care provider.  If you smoke, do not smoke without supervision.  Keep all follow-up visits as told by your health care provider. This is important. Contact a health care provider if:  You have nausea or vomiting that does not get better with medicine.  You cannot eat or drink without vomiting.  You have pain that does not get better with medicine.  You are unable to pass urine.  You develop a skin rash.  You have a fever.  You have redness around your IV site that gets worse. Get help right away if:  You have difficulty breathing.  You have chest pain.  You have blood in your urine or stool, or you vomit blood. Summary  After the procedure, it is common to have a sore throat or nausea. It is also common to feel tired.  Have a responsible adult stay with you for the time you are told. It is important to have someone help care for you until you are awake and alert.  When you feel hungry, start by eating small amounts of foods that are soft and easy to digest (bland), such as toast. Gradually return to your regular diet.  Drink enough fluid to keep your urine pale yellow.  Return to your normal activities as told by your health care provider. Ask your health care provider what activities are safe for  you. This information is not intended to replace advice given to you by your health care provider. Make sure you discuss any questions you have with your health care provider. Document Revised: 01/05/2020 Document Reviewed: 08/04/2019 Elsevier Patient Education  2021 [employer].

## 2020-07-29 ENCOUNTER — Other Ambulatory Visit: Payer: Self-pay | Admitting: Obstetrics & Gynecology

## 2020-07-30 ENCOUNTER — Encounter (HOSPITAL_COMMUNITY): Payer: Self-pay

## 2020-07-30 ENCOUNTER — Other Ambulatory Visit: Payer: Self-pay

## 2020-07-30 ENCOUNTER — Encounter (HOSPITAL_COMMUNITY)
Admission: RE | Admit: 2020-07-30 | Discharge: 2020-07-30 | Disposition: A | Discharge status: Home / Self Care | Payer: Medicare Other | Source: Ambulatory Visit | Attending: Obstetrics & Gynecology | Admitting: Obstetrics & Gynecology

## 2020-07-30 ENCOUNTER — Other Ambulatory Visit (HOSPITAL_COMMUNITY)
Admission: RE | Admit: 2020-07-30 | Discharge: 2020-07-30 | Disposition: A | Discharge status: Home / Self Care | Payer: Medicare Other | Source: Ambulatory Visit | Attending: Obstetrics & Gynecology | Admitting: Obstetrics & Gynecology

## 2020-07-30 ENCOUNTER — Encounter: Payer: Self-pay | Admitting: Genetic Counselor

## 2020-07-30 DIAGNOSIS — Z1379 Encounter for other screening for genetic and chromosomal anomalies: Secondary | ICD-10-CM | POA: Insufficient documentation

## 2020-07-30 DIAGNOSIS — Z01812 Encounter for preprocedural laboratory examination: Secondary | ICD-10-CM | POA: Insufficient documentation

## 2020-07-30 DIAGNOSIS — Z20822 Contact with and (suspected) exposure to covid-19: Secondary | ICD-10-CM | POA: Insufficient documentation

## 2020-07-30 HISTORY — DX: Cardiac arrhythmia, unspecified: I49.9

## 2020-07-30 LAB — URINALYSIS, ROUTINE W REFLEX MICROSCOPIC
Bacteria, UA: NONE SEEN
Bilirubin Urine: NEGATIVE
Glucose, UA: NEGATIVE mg/dL
Hgb urine dipstick: NEGATIVE
Ketones, ur: NEGATIVE mg/dL
Nitrite: NEGATIVE
Protein, ur: NEGATIVE mg/dL
Specific Gravity, Urine: 1.003 — ABNORMAL LOW (ref 1.005–1.030)
pH: 6 (ref 5.0–8.0)

## 2020-07-30 LAB — COMPREHENSIVE METABOLIC PANEL
ALT: 18 U/L (ref 0–44)
AST: 26 U/L (ref 15–41)
Albumin: 3.8 g/dL (ref 3.5–5.0)
Alkaline Phosphatase: 52 U/L (ref 38–126)
Anion gap: 8 (ref 5–15)
BUN: 11 mg/dL (ref 6–20)
CO2: 25 mmol/L (ref 22–32)
Calcium: 9 mg/dL (ref 8.9–10.3)
Chloride: 100 mmol/L (ref 98–111)
Creatinine, Ser: 0.89 mg/dL (ref 0.44–1.00)
GFR, Estimated: >60 mL/min (ref >60)
Glucose, Bld: 78 mg/dL (ref 70–99)
Potassium: 4.2 mmol/L (ref 3.5–5.1)
Sodium: 133 mmol/L — ABNORMAL LOW (ref 135–145)
Total Bilirubin: 0.5 mg/dL (ref 0.3–1.2)
Total Protein: 8.5 g/dL — ABNORMAL HIGH (ref 6.5–8.1)

## 2020-07-30 LAB — CBC
HCT: 39.6 % (ref 36.0–46.0)
Hemoglobin: 12.5 g/dL (ref 12.0–15.0)
MCH: 26.8 pg (ref 26.0–34.0)
MCHC: 31.6 g/dL (ref 30.0–36.0)
MCV: 84.8 fL (ref 80.0–100.0)
Platelets: 188 10*3/uL (ref 150–400)
RBC: 4.67 MIL/uL (ref 3.87–5.11)
RDW: 13.2 % (ref 11.5–15.5)
WBC: 2.7 10*3/uL — ABNORMAL LOW (ref 4.0–10.5)
nRBC: 0 % (ref 0.0–0.2)

## 2020-07-30 LAB — HCG, QUANTITATIVE, PREGNANCY: hCG, Beta Chain, Quant, S: <1 m[IU]/mL (ref <5)

## 2020-07-30 LAB — RAPID HIV SCREEN (HIV 1/2 AB+AG)
HIV 1/2 Antibodies: NONREACTIVE
HIV-1 P24 Antigen - HIV24: NONREACTIVE

## 2020-07-30 LAB — SARS CORONAVIRUS 2 (TAT 6-24 HRS): SARS Coronavirus 2: NEGATIVE

## 2020-07-31 NOTE — H&P (Signed)
Preoperative History and Physical  April Acevedo is a 45 y.o. [phone] with No LMP recorded. (Menstrual status: Irregular Periods). admitted for a . admitted for a laparoscopic removal of both ovaries due to estrogen receptor positive breast cancer and continued ovarian estrogen production.     PMH:    Past Medical History:  Diagnosis Date  . Arthritis   . Atrial fibrillation ([place])   . Cancer ([place])   . Dysrhythmia    SVT and occ PVC  . Thyroid nodule greater than or equal to 1.5 cm in diameter incidentally noted on imaging study 02/20/2020    PSH:     Past Surgical History:  Procedure Laterality Date  . BREAST SURGERY Bilateral 2017   in [place]    POb/GynH:      OB History    Gravida  2   Para  2   Term  0   Preterm  2   AB  0   Living  2     SAB  0   IAB  0   Ectopic  0   Multiple  0   Live Births  2           SH:   Social History   Tobacco Use  . Smoking status: Never Smoker  . Smokeless tobacco: Never Used  Vaping Use  . Vaping Use: Never used  Substance Use Topics  . Alcohol use: Yes    Comment: rare  . Drug use: Never    FH:    Family History  Problem Relation Age of Onset  . Parkinson's disease Paternal Grandfather   . Colon cancer Paternal Grandmother   . Kidney disease Maternal Grandmother   . Ovarian cancer Maternal Grandmother   . Aneurysm Maternal Grandfather   . Suicidality Father   . Diabetes Mother   . Hyperthyroidism Mother   . Aneurysm Brother   . Colon cancer Sister      Allergies: No Known Allergies  Medications:      No current facility-administered medications for this encounter.  Current Outpatient Medications:  .  busPIRone (BUSPAR) 7.5 MG tablet, Take 1 tablet (7.5 mg total) by mouth daily., Disp: 30 tablet, Rfl: 6 .  calcium-vitamin D (OSCAL WITH D) 500-200 MG-UNIT tablet, Take 1 tablet by mouth 2 (two) times daily., Disp: 60 tablet, Rfl: 6 .  exemestane (AROMASIN) 25 MG tablet, Take 1 tablet  (25 mg total) by mouth daily after breakfast., Disp: 30 tablet, Rfl: 6 .  ibuprofen (ADVIL) 800 MG tablet, Take 1 tablet (800 mg total) by mouth every 8 (eight) hours as needed. (Patient taking differently: Take 800 mg by mouth 3 (three) times daily.), Disp: 30 tablet, Rfl: 6 .  leuprolide (LUPRON) 7.5 MG injection, Inject 7.5 mg into the muscle every 3 (three) months., Disp: , Rfl:  .  LORazepam (ATIVAN) 0.5 MG tablet, Take 0.5 mg by mouth daily as needed (anxiety)., Disp: , Rfl:  .  meclizine (ANTIVERT) 25 MG tablet, Take 1 tablet (25 mg total) by mouth 3 (three) times daily as needed for dizziness., Disp: 30 tablet, Rfl: 0 .  SUMAtriptan (IMITREX) 50 MG tablet, Take 1 tablet (50 mg total) by mouth daily as needed for migraine. May repeat in 2 hours if headache persists or recurs., Disp: 15 tablet, Rfl: 2 .  topiramate (TOPAMAX) 50 MG tablet, Take 1 tablet (50 mg total) by mouth at bedtime., Disp: 30 tablet, Rfl: 0 .  traZODone (DESYREL) 50 MG tablet, Take  50 mg by mouth at bedtime., Disp: , Rfl:  .  NONFORMULARY OR COMPOUNDED ITEM, outpatient vestibular therapy., Disp: 1 each, Rfl: 0 .  propranolol (INDERAL) 10 MG tablet, Take 1 tablet (10 mg total) by mouth at bedtime., Disp: 30 tablet, Rfl: 0  Review of Systems:   Review of Systems  Constitutional: Negative for fever, chills, weight loss, malaise/fatigue and diaphoresis.  HENT: Negative for hearing loss, ear pain, nosebleeds, congestion, sore throat, neck pain, tinnitus and ear discharge.   Eyes: Negative for blurred vision, double vision, photophobia, pain, discharge and redness.  Respiratory: Negative for cough, hemoptysis, sputum production, shortness of breath, wheezing and stridor.   Cardiovascular: Negative for chest pain, palpitations, orthopnea, claudication, leg swelling and PND.  Gastrointestinal: Positive for abdominal pain. Negative for heartburn, nausea, vomiting, diarrhea, constipation, blood in stool and melena.   Genitourinary: Negative for dysuria, urgency, frequency, hematuria and flank pain.  Musculoskeletal: Negative for myalgias, back pain, joint pain and falls.  Skin: Negative for itching and rash.  Neurological: Negative for dizziness, tingling, tremors, sensory change, speech change, focal weakness, seizures, loss of consciousness, weakness and headaches.  Endo/Heme/Allergies: Negative for environmental allergies and polydipsia. Does not bruise/bleed easily.  Psychiatric/Behavioral: Negative for depression, suicidal ideas, hallucinations, memory loss and substance abuse. The patient is not nervous/anxious and does not have insomnia.      PHYSICAL EXAM:  There were no vitals taken for this visit.    Vitals reviewed. Constitutional: She is oriented to person, place, and time. She appears well-developed and well-nourished.  HENT:  Head: Normocephalic and atraumatic.  Right Ear: External ear normal.  Left Ear: External ear normal.  Nose: Nose normal.  Mouth/Throat: Oropharynx is clear and moist.  Eyes: Conjunctivae and EOM are normal. Pupils are equal, round, and reactive to light. Right eye exhibits no discharge. Left eye exhibits no discharge. No scleral icterus.  Neck: Normal range of motion. Neck supple. No tracheal deviation present. No thyromegaly present.  Cardiovascular: Normal rate, regular rhythm, normal heart sounds and intact distal pulses.  Exam reveals no gallop and no friction rub.   No murmur heard. Respiratory: Effort normal and breath sounds normal. No respiratory distress. She has no wheezes. She has no rales. She exhibits no tenderness.  GI: Soft. Bowel sounds are normal. She exhibits no distension and no mass. There is tenderness. There is no rebound and no guarding.  Genitourinary:       Vulva is normal without lesions Vagina is pink moist without discharge Cervix normal in appearance and pap is normal Uterus is normal size, contour, position, consistency, mobility,  non-tender Adnexa is negative with normal sized ovaries by sonogram  Musculoskeletal: Normal range of motion. She exhibits no edema and no tenderness.  Neurological: She is alert and oriented to person, place, and time. She has normal reflexes. She displays normal reflexes. No cranial nerve deficit. She exhibits normal muscle tone. Coordination normal.  Skin: Skin is warm and dry. No rash noted. No erythema. No pallor.  Psychiatric: She has a normal mood and affect. Her behavior is normal. Judgment and thought content normal.    Labs: Results for orders placed or performed during the hospital encounter of 07/30/20 (from the past 336 hour(s))  SARS CORONAVIRUS 2 (TAT 6-24 HRS) Nasopharyngeal Nasopharyngeal Swab   Collection Time: 07/30/20  1:39 PM   Specimen: Nasopharyngeal Swab  Result Value Ref Range   SARS Coronavirus 2 NEGATIVE NEGATIVE  Results for orders placed or performed during the hospital encounter of  07/30/20 (from the past 336 hour(s))  Urinalysis, Routine w reflex microscopic Urine, Clean Catch   Collection Time: 07/30/20  3:36 PM  Result Value Ref Range   Color, Urine STRAW (A) YELLOW   APPearance CLEAR CLEAR   Specific Gravity, Urine 1.003 (L) 1.005 - 1.030   pH 6.0 5.0 - 8.0   Glucose, UA NEGATIVE NEGATIVE mg/dL   Hgb urine dipstick NEGATIVE NEGATIVE   Bilirubin Urine NEGATIVE NEGATIVE   Ketones, ur NEGATIVE NEGATIVE mg/dL   Protein, ur NEGATIVE NEGATIVE mg/dL   Nitrite NEGATIVE NEGATIVE   Leukocytes,Ua MODERATE (A) NEGATIVE   RBC / HPF 0-5 0 - 5 RBC/hpf   WBC, UA 6-10 0 - 5 WBC/hpf   Bacteria, UA NONE SEEN NONE SEEN   Squamous Epithelial / LPF 0-5 0 - 5  CBC   Collection Time: 07/30/20  3:37 PM  Result Value Ref Range   WBC 2.7 (L) 4.0 - 10.5 K/uL   RBC 4.67 3.87 - 5.11 MIL/uL   Hemoglobin 12.5 12.0 - 15.0 g/dL   HCT 39.6 36.0 - 46.0 %   MCV 84.8 80.0 - 100.0 fL   MCH 26.8 26.0 - 34.0 pg   MCHC 31.6 30.0 - 36.0 g/dL   RDW 13.2 11.5 - 15.5 %   Platelets  188 150 - 400 K/uL   nRBC 0.0 0.0 - 0.2 %  Comprehensive metabolic panel   Collection Time: 07/30/20  3:37 PM  Result Value Ref Range   Sodium 133 (L) 135 - 145 mmol/L   Potassium 4.2 3.5 - 5.1 mmol/L   Chloride 100 98 - 111 mmol/L   CO2 25 22 - 32 mmol/L   Glucose, Bld 78 70 - 99 mg/dL   BUN 11 6 - 20 mg/dL   Creatinine, Ser 0.89 0.44 - 1.00 mg/dL   Calcium 9.0 8.9 - 10.3 mg/dL   Total Protein 8.5 (H) 6.5 - 8.1 g/dL   Albumin 3.8 3.5 - 5.0 g/dL   AST 26 15 - 41 U/L   ALT 18 0 - 44 U/L   Alkaline Phosphatase 52 38 - 126 U/L   Total Bilirubin 0.5 0.3 - 1.2 mg/dL   GFR, Estimated >60 >60 mL/min   Anion gap 8 5 - 15  hCG, quantitative, pregnancy   Collection Time: 07/30/20  3:37 PM  Result Value Ref Range   hCG, Beta Chain, Quant, S <1 <5 mIU/mL  Rapid HIV screen (HIV 1/2 Ab+Ag)   Collection Time: 07/30/20  3:37 PM  Result Value Ref Range   HIV-1 P24 Antigen - HIV24 NON REACTIVE NON REACTIVE   HIV 1/2 Antibodies NON REACTIVE NON REACTIVE   Interpretation (HIV Ag Ab)      A non reactive test result means that HIV 1 or HIV 2 antibodies and HIV 1 p24 antigen were not detected in the specimen.    EKG: Orders placed or performed during the hospital encounter of 02/18/20  . EKG 12-Lead  . EKG 12-Lead  . ED EKG  . ED EKG  . EKG  . EKG 12-Lead  . EKG 12-Lead  . EKG 12-Lead  . EKG 12-Lead    Imaging Studies: No results found.    Assessment: Prophylactic ovarian removal, laparoscopic, due to estrogen receptor positive breast cancer and continued ovarian estrogen production   Patient Active Problem List   Diagnosis Date Noted  . Genetic testing 07/30/2020  . Breast cancer, stage 1, estrogen receptor positive, left ([place]) 03/15/2020  . Vertigo 02/20/2020  .  Acute encephalopathy 02/20/2020  . Unspecified atrial fibrillation ([place]) 02/20/2020  . Headache 02/20/2020  . Thyroid nodule greater than or equal to 1.5 cm in diameter incidentally noted on imaging study 02/20/2020   . Constipation 02/20/2020  . Syncope 02/18/2020    Plan: Laparoscopic removal of both ovaries  Florian Buff 07/31/2020 3:03 PM

## 2020-08-01 ENCOUNTER — Other Ambulatory Visit: Payer: Self-pay

## 2020-08-01 ENCOUNTER — Ambulatory Visit (HOSPITAL_COMMUNITY)
Admission: RE | Admit: 2020-08-01 | Discharge: 2020-08-01 | Disposition: A | Discharge status: Home / Self Care | Payer: Medicare Other | Source: Ambulatory Visit | Attending: Obstetrics & Gynecology | Admitting: Obstetrics & Gynecology

## 2020-08-01 ENCOUNTER — Encounter (HOSPITAL_COMMUNITY): Payer: Self-pay | Admitting: Obstetrics & Gynecology

## 2020-08-01 ENCOUNTER — Ambulatory Visit (HOSPITAL_COMMUNITY): Payer: Medicare Other | Admitting: Anesthesiology

## 2020-08-01 ENCOUNTER — Encounter (HOSPITAL_COMMUNITY)
Admission: RE | Disposition: A | Discharge status: Home / Self Care | Payer: Self-pay | Source: Ambulatory Visit | Attending: Obstetrics & Gynecology

## 2020-08-01 DIAGNOSIS — C50919 Malignant neoplasm of unspecified site of unspecified female breast: Secondary | ICD-10-CM

## 2020-08-01 DIAGNOSIS — Z79899 Other long term (current) drug therapy: Secondary | ICD-10-CM | POA: Diagnosis not present

## 2020-08-01 DIAGNOSIS — Z79811 Long term (current) use of aromatase inhibitors: Secondary | ICD-10-CM | POA: Diagnosis not present

## 2020-08-01 DIAGNOSIS — Z9851 Tubal ligation status: Secondary | ICD-10-CM | POA: Insufficient documentation

## 2020-08-01 DIAGNOSIS — Z170 Estrogen receptor positive status [ER+]: Secondary | ICD-10-CM | POA: Diagnosis not present

## 2020-08-01 DIAGNOSIS — Z4002 Encounter for prophylactic removal of ovary: Secondary | ICD-10-CM | POA: Insufficient documentation

## 2020-08-01 DIAGNOSIS — C50912 Malignant neoplasm of unspecified site of left female breast: Secondary | ICD-10-CM | POA: Diagnosis not present

## 2020-08-01 HISTORY — PX: LAPAROSCOPIC BILATERAL SALPINGO OOPHERECTOMY: SHX5890

## 2020-08-01 SURGERY — SALPINGO-OOPHORECTOMY, BILATERAL, LAPAROSCOPIC
Anesthesia: General | Site: Abdomen | Laterality: Bilateral

## 2020-08-01 MED ORDER — KETOROLAC TROMETHAMINE 30 MG/ML IJ SOLN: 30.0000 mg | Freq: Once | INTRAMUSCULAR | Status: AC

## 2020-08-01 MED ORDER — CHLORHEXIDINE GLUCONATE 0.12 % MT SOLN
15.0000 mL | Freq: Once | OROMUCOSAL | Status: AC
  Administered 2020-08-01: 15 mL via OROMUCOSAL

## 2020-08-01 MED ORDER — DEXAMETHASONE SODIUM PHOSPHATE 10 MG/ML IJ SOLN
INTRAMUSCULAR | Status: DC | PRN
  Administered 2020-08-01: 5 mg via INTRAVENOUS

## 2020-08-01 MED ORDER — KETOROLAC TROMETHAMINE 10 MG PO TABS: 10.0000 mg | ORAL_TABLET | Freq: Three times a day (TID) | ORAL | 0 refills | Status: DC | PRN

## 2020-08-01 MED ORDER — PROPOFOL 10 MG/ML IV BOLUS
INTRAVENOUS | Status: DC | PRN
  Administered 2020-08-01: 180 mg via INTRAVENOUS

## 2020-08-01 MED ORDER — LACTATED RINGERS IV BOLUS
500.0000 mL | Freq: Once | INTRAVENOUS | Status: AC
  Administered 2020-08-01: 500 mL via INTRAVENOUS

## 2020-08-01 MED ORDER — SUGAMMADEX SODIUM 500 MG/5ML IV SOLN
INTRAVENOUS | Status: DC | PRN
  Administered 2020-08-01: 150 mg via INTRAVENOUS

## 2020-08-01 MED ORDER — ROCURONIUM BROMIDE 10 MG/ML (PF) SYRINGE
PREFILLED_SYRINGE | INTRAVENOUS | Status: DC | PRN
  Administered 2020-08-01: 10 mg via INTRAVENOUS
  Administered 2020-08-01: 50 mg via INTRAVENOUS

## 2020-08-01 MED ORDER — ONDANSETRON HCL 4 MG/2ML IJ SOLN
INTRAMUSCULAR | Status: DC | PRN
  Administered 2020-08-01: 4 mg via INTRAVENOUS

## 2020-08-01 MED ORDER — MIDAZOLAM HCL 2 MG/2ML IJ SOLN
INTRAMUSCULAR | Status: DC | PRN
  Administered 2020-08-01: 2 mg via INTRAVENOUS

## 2020-08-01 MED ORDER — SCOPOLAMINE 1 MG/3DAYS TD PT72
MEDICATED_PATCH | TRANSDERMAL | Status: AC
  Administered 2020-08-01: 1.5 mg
  Filled 2020-08-01: qty 1

## 2020-08-01 MED ORDER — FENTANYL CITRATE (PF) 100 MCG/2ML IJ SOLN
INTRAMUSCULAR | Status: DC | PRN
  Administered 2020-08-01: 100 ug via INTRAVENOUS
  Administered 2020-08-01: 50 ug via INTRAVENOUS

## 2020-08-01 MED ORDER — ROCURONIUM BROMIDE 10 MG/ML (PF) SYRINGE
PREFILLED_SYRINGE | INTRAVENOUS | Status: AC
  Filled 2020-08-01: qty 10

## 2020-08-01 MED ORDER — CEFAZOLIN SODIUM-DEXTROSE 2-4 GM/100ML-% IV SOLN
INTRAVENOUS | Status: AC
  Filled 2020-08-01: qty 100

## 2020-08-01 MED ORDER — HYDROMORPHONE HCL 1 MG/ML IJ SOLN
0.2500 mg | INTRAMUSCULAR | Status: DC | PRN
  Administered 2020-08-01 (×3): 0.5 mg via INTRAVENOUS
  Filled 2020-08-01 (×3): qty 0.5

## 2020-08-01 MED ORDER — LIDOCAINE HCL (CARDIAC) PF 100 MG/5ML IV SOSY
PREFILLED_SYRINGE | INTRAVENOUS | Status: DC | PRN
  Administered 2020-08-01: 40 mg via INTRATRACHEAL

## 2020-08-01 MED ORDER — MIDAZOLAM HCL 2 MG/2ML IJ SOLN
INTRAMUSCULAR | Status: AC
  Filled 2020-08-01: qty 2

## 2020-08-01 MED ORDER — BUPIVACAINE LIPOSOME 1.3 % IJ SUSP
INTRAMUSCULAR | Status: AC
  Filled 2020-08-01: qty 20

## 2020-08-01 MED ORDER — PROMETHAZINE HCL 25 MG/ML IJ SOLN
6.2500 mg | INTRAMUSCULAR | Status: DC | PRN
Start: 2020-08-01 — End: 2020-08-01

## 2020-08-01 MED ORDER — ORAL CARE MOUTH RINSE: 15.0000 mL | Freq: Once | OROMUCOSAL | Status: AC

## 2020-08-01 MED ORDER — MEPERIDINE HCL 50 MG/ML IJ SOLN
6.2500 mg | INTRAMUSCULAR | Status: DC | PRN
Start: 2020-08-01 — End: 2020-08-01

## 2020-08-01 MED ORDER — POVIDONE-IODINE 10 % EX SWAB: 2.0000 "application " | Freq: Once | CUTANEOUS | Status: DC

## 2020-08-01 MED ORDER — SCOPOLAMINE 1 MG/3DAYS TD PT72: 1.0000 | MEDICATED_PATCH | Freq: Once | TRANSDERMAL | Status: AC

## 2020-08-01 MED ORDER — HYDROCODONE-ACETAMINOPHEN 5-325 MG PO TABS: 1.0000 | ORAL_TABLET | Freq: Four times a day (QID) | ORAL | 0 refills | Status: DC | PRN

## 2020-08-01 MED ORDER — LIDOCAINE HCL (PF) 2 % IJ SOLN
INTRAMUSCULAR | Status: AC
  Filled 2020-08-01: qty 5

## 2020-08-01 MED ORDER — BUPIVACAINE LIPOSOME 1.3 % IJ SUSP
INTRAMUSCULAR | Status: DC | PRN
  Administered 2020-08-01: 15 mL

## 2020-08-01 MED ORDER — ONDANSETRON HCL 4 MG/2ML IJ SOLN
INTRAMUSCULAR | Status: AC
  Filled 2020-08-01: qty 2

## 2020-08-01 MED ORDER — KETOROLAC TROMETHAMINE 30 MG/ML IJ SOLN
INTRAMUSCULAR | Status: AC
  Administered 2020-08-01: 30 mg via INTRAVENOUS
  Filled 2020-08-01: qty 1

## 2020-08-01 MED ORDER — ONDANSETRON 8 MG PO TBDP: 8.0000 mg | ORAL_TABLET | Freq: Three times a day (TID) | ORAL | 0 refills | Status: DC | PRN

## 2020-08-01 MED ORDER — PROPOFOL 10 MG/ML IV BOLUS
INTRAVENOUS | Status: AC
  Filled 2020-08-01: qty 20

## 2020-08-01 MED ORDER — FENTANYL CITRATE (PF) 100 MCG/2ML IJ SOLN
INTRAMUSCULAR | Status: AC
  Filled 2020-08-01: qty 2

## 2020-08-01 MED ORDER — BUPIVACAINE LIPOSOME 1.3 % IJ SUSP
20.0000 mL | Freq: Once | INTRAMUSCULAR | Status: DC
  Filled 2020-08-01: qty 20

## 2020-08-01 MED ORDER — CEFAZOLIN SODIUM-DEXTROSE 2-4 GM/100ML-% IV SOLN
2.0000 g | INTRAVENOUS | Status: AC
  Administered 2020-08-01: 2 g via INTRAVENOUS

## 2020-08-01 MED ORDER — LACTATED RINGERS IV SOLN: INTRAVENOUS | Status: DC

## 2020-08-01 MED ORDER — SODIUM CHLORIDE 0.9 % IR SOLN
Status: DC | PRN
  Administered 2020-08-01: 1000 mL

## 2020-08-01 SURGICAL SUPPLY — 46 items
ADH SKN CLS APL DERMABOND .7 (GAUZE/BANDAGES/DRESSINGS) ×1
BAG HAMPER (MISCELLANEOUS) ×2 IMPLANT
BAG RETRIEVAL 10 (BASKET) ×1
BLADE SURG SZ11 CARB STEEL (BLADE) ×2 IMPLANT
CLOTH BEACON ORANGE TIMEOUT ST (SAFETY) ×2 IMPLANT
COVER LIGHT HANDLE STERIS (MISCELLANEOUS) ×4 IMPLANT
COVER WAND RF STERILE (DRAPES) ×2 IMPLANT
DERMABOND ADVANCED (GAUZE/BANDAGES/DRESSINGS) ×1
DERMABOND ADVANCED .7 DNX12 (GAUZE/BANDAGES/DRESSINGS) ×1 IMPLANT
ELECT REM PT RETURN 9FT ADLT (ELECTROSURGICAL) ×2
ELECTRODE REM PT RTRN 9FT ADLT (ELECTROSURGICAL) ×1 IMPLANT
FILTER SMOKE EVAC LAPAROSHD (FILTER) ×2 IMPLANT
GAUZE 4X4 16PLY RFD (DISPOSABLE) ×2 IMPLANT
GLOVE ECLIPSE 8.0 STRL XLNG CF (GLOVE) ×2 IMPLANT
GLOVE SRG 8 PF TXTR STRL LF DI (GLOVE) ×1 IMPLANT
GLOVE SURG UNDER POLY LF SZ7 (GLOVE) ×6 IMPLANT
GLOVE SURG UNDER POLY LF SZ8 (GLOVE) ×2
GOWN STRL REUS W/TWL LRG LVL3 (GOWN DISPOSABLE) ×2 IMPLANT
GOWN STRL REUS W/TWL XL LVL3 (GOWN DISPOSABLE) ×2 IMPLANT
INST SET LAPROSCOPIC GYN AP (KITS) ×2 IMPLANT
IV NS IRRIG 3000ML ARTHROMATIC (IV SOLUTION) IMPLANT
KIT TURNOVER KIT A (KITS) ×2 IMPLANT
MANIFOLD NEPTUNE II (INSTRUMENTS) ×2 IMPLANT
NEEDLE HYPO 18GX1.5 BLUNT FILL (NEEDLE) ×2 IMPLANT
NEEDLE HYPO 22GX1.5 SAFETY (NEEDLE) ×2 IMPLANT
NEEDLE INSUFFLATION 120MM (ENDOMECHANICALS) ×2 IMPLANT
PACK PERI GYN (CUSTOM PROCEDURE TRAY) ×2 IMPLANT
PAD ARMBOARD 7.5X6 YLW CONV (MISCELLANEOUS) ×2 IMPLANT
SET BASIN LINEN APH (SET/KITS/TRAYS/PACK) ×2 IMPLANT
SET TUBE IRRIG SUCTION NO TIP (IRRIGATION / IRRIGATOR) ×2 IMPLANT
SET TUBE SMOKE EVAC HIGH FLOW (TUBING) ×2 IMPLANT
SHEARS HARMONIC ACE PLUS 36CM (ENDOMECHANICALS) ×2 IMPLANT
SLEEVE ENDOPATH XCEL 5M (ENDOMECHANICALS) ×2 IMPLANT
SOL ANTI FOG 6CC (MISCELLANEOUS) ×1 IMPLANT
SOLUTION ANTI FOG 6CC (MISCELLANEOUS) ×1
SPONGE GAUZE 2X2 8PLY STRL LF (GAUZE/BANDAGES/DRESSINGS) ×2 IMPLANT
SUT VICRYL 0 UR6 27IN ABS (SUTURE) ×2 IMPLANT
SUT VICRYL AB 3-0 FS1 BRD 27IN (SUTURE) ×2 IMPLANT
SYR 10ML LL (SYRINGE) ×2 IMPLANT
SYR 20ML LL LF (SYRINGE) ×4 IMPLANT
SYS BAG RETRIEVAL 10MM (BASKET) ×1
SYSTEM BAG RETRIEVAL 10MM (BASKET) ×1 IMPLANT
TAPE PAPER 2X10 WHT MICROPORE (GAUZE/BANDAGES/DRESSINGS) ×2 IMPLANT
TROCAR ENDO BLADELESS 11MM (ENDOMECHANICALS) ×2 IMPLANT
TROCAR XCEL NON-BLD 5MMX100MML (ENDOMECHANICALS) ×2 IMPLANT
WARMER LAPAROSCOPE (MISCELLANEOUS) ×2 IMPLANT

## 2020-08-01 NOTE — Op Note (Signed)
Preoperative diagnosis: ER/PR positive breast cancer                                           Continued ovarian production of hormones  Postop diagnosis: Same as above  Procedure: Laparoscopic removal of both tubes and ovaries as part of adjuvant therapy for her ER/PR positive breast cancer  Surgeon:  Florian Buff, MD  Anesthesia: General endotracheal  Findings: Patient had normal uterus tubes and ovaries no peritoneal abnormalities were noted  Description of operation: Patient was taken to the operating room and placed in the supine position where she underwent general endotracheal anesthesia She was placed in low lithotomy position She was prepped and draped for laparoscopic procedure Incision was made in the umbilicus and the fascia was grasped with a Coker clamp A V ER ES needle was placed into the peritoneal cavity with 1 pass light difficulty The peritoneal cavity was insufflated The 11 mm nonbladed video laparoscope was placed into the peritoneal cavity 1 pass light difficulty The peritoneal cavity was confirmed Incisions were made in the right and left lower quadrants 5 mm bladed trochars were placed in both incision sites into the peritoneal cavity under direct visualization without difficulty The left adnexa was grasped and the infundibulopelvic ligament was hemostatically ligated with the harmonic scalpel The tubal and ovarian pedicle was then taken down with the harmonic scalpel including the utero-ovarian ligament The left tube and ovary were excised in total  Attention was then turned to the right side The right ovary was grasped The right infundibulopelvic ligament was hemostatically ligated using the harmonic scalpel The peritoneal pedicle of the tube and ovary were then taken down with the harmonic scalpel hemostatically as well The utero-ovarian ligament was ligated using the harmonic scalpel and the  right tube and ovary were removed in toto as well  Both  pedicles were hemostatic  The Endo Catch bag was placed into the umbilical port 5 mm scope was used in the left lower quadrant Both tubes and ovaries were scooped up in the Endo Catch bag and removed together and sent the pathology together as well Again both pedicles were found to be hemostatic All trochars were removed from the peritoneal cavity The umbilical fascia was closed using 0 Vicryl The umbilical subcutaneous tissue was reapproximated using 0 Vicryl All 3 incisions were closed using 3-0 Vicryl in a subcuticular fashion Dermabond was placed at all 3 incision sites 4 cc of Exparel was injected into each lower quadrant incision 7 cc of Exparel was injected into the umbilical incision for postoperative pain management  The patient was awakened from anesthesia taken recovery room good stable condition all counts were correct x3 Shereceived Ancef and Toradol preoperatively prophylactically Will be discharged home on the [place], MD 08/01/2020 10:06 AM

## 2020-08-01 NOTE — Interval H&P Note (Signed)
History and Physical Interval Note:  08/01/2020 8:33 AM  April Acevedo  has presented today for surgery, with the diagnosis of Estrogen Receptor positive breast Cancer.  The various methods of treatment have been discussed with the patient and family. After consideration of risks, benefits and other options for treatment, the patient has consented to  Procedure(s): LAPAROSCOPIC BILATERAL SALPINGO OOPHORECTOMY (Bilateral) as a surgical intervention.  The patient's history has been reviewed, patient examined, no change in status, stable for surgery.  I have reviewed the patient's chart and labs.  Questions were answered to the patient's satisfaction.     Florian Buff

## 2020-08-01 NOTE — Discharge Instructions (Signed)
Laparoscopic Removal of tubes and ovaries, Care After The following information offers guidance on how to care for yourself after your procedure. Your health care provider may also give you more specific instructions. If you have problems or questions, contact your health care provider. What can I expect after the procedure? After the procedure, it is common to have:  A sore throat if general anesthesia was used.  Pain in shoulders, back, and abdomen. This is caused by the gas that was used during the procedure.  Mild discomfort or cramping in your abdomen.  Pain or soreness in the area where the surgical incision was made.  A bloated feeling.  Tiredness.  Nausea and vomiting. Follow these instructions at home: Medicines  Take over-the-counter and prescription medicines only as told by your health care provider.  Ask your health care provider if the medicine prescribed to you: ? Requires you to avoid driving or using heavy machinery. ? Can cause constipation. You may need to take these actions to prevent or treat constipation:  Drink enough fluid to keep your urine pale yellow.  Take over-the-counter or prescription medicines.  Eat foods that are high in fiber, such as beans, whole grains, and fresh fruits and vegetables.  Limit foods that are high in fat and processed sugars, such as fried or sweet foods.  Do not take aspirin because it can cause bleeding. Incision care  Follow instructions from your health care provider about how to take care of your incision. Make sure you: ? Wash your hands with soap and water for at least 20 seconds before and after you change your bandage (dressing). If soap and water are not available, use hand sanitizer. ? Change your dressing as told by your health care provider. ? Leave stitches (sutures), skin glue, or adhesive strips in place. These skin closures may need to stay in place for 2 weeks or longer. If adhesive strip edges start to  loosen and curl up, you may trim the loose edges. Do not remove adhesive strips completely unless your health care provider tells you to do that.  Check your incision area every day for signs of infection. Check for: ? Redness, swelling, or more pain. ? Fluid or blood. ? Warmth. ? Pus or a bad smell.      Activity  Rest as told by your health care provider.  Avoid sitting for a long time without moving. Get up to take short walks every 1-2 hours. This is important to improve blood flow and breathing. Ask for help if you feel weak or unsteady.  Do not have sex, douche, or put a tampon or anything else in your vagina for 6 weeks or as long as told by your health care provider.  Do not lift anything that is heavier than your baby for 2 weeks, or the limit that you are told, until your health care provider says that it is safe.  Do not take baths, swim, or use a hot tub until your health care provider approves. Ask your health care provider if you may take showers. You may only be allowed to take sponge baths.  Return to your normal activities as told by your health care provider. Ask your health care provider what activities are safe for you. General instructions  After the procedure you may need to wear a sanitary pad for vaginal discharge.  Have someone help you with your daily household tasks for the first few days.  Keep all follow-up visits. This is important.  Contact a health care provider if:  You have redness, swelling, or more pain around your incision.  Your incision feels warm to the touch.  You have pus or a bad smell coming from your incision.  The edges of your incision break open after the sutures have been removed.  Your pain does not improve after 2-3 days.  You have a rash.  You repeatedly become dizzy or light-headed.  Your pain medicine is not helping. Get help right away if:  You have a fever or chills.  You faint.  You have increasing pain in  your abdomen.  You have severe pain in one or both of your shoulders.  You have fluid or blood coming from your sutures or heavy bleeding from your vagina.  You have shortness of breath or difficulty breathing.  You have chest pain, leg pain, or leg swelling.  You have ongoing nausea, vomiting, or diarrhea. These symptoms may represent a serious problem that is an emergency. Do not wait to see if the symptoms will go away. Get medical help right away. Call your local emergency services (911 in the [place] Do not drive yourself to the hospital. Summary  After the procedure, it is common to have mild discomfort or cramping in your abdomen.  After the procedure you may need to wear a sanitary pad for vaginal discharge.  Take over-the-counter and prescription medicines only as told by your health care provider.  Watch for symptoms that should prompt you to call your health care provider.  Keep all follow-up visits. This is important. This information is not intended to replace advice given to you by your health care provider. Make sure you discuss any questions you have with your health care provider. Document Revised: 01/06/2020 Document Reviewed: 01/06/2020 Elsevier Patient Education  2021 [place] UNTIL Sunday August 05, 2020. DO NOT USE ADDITIONAL NUMBING MEDICATIONS WITHOUT CONSULTING A PHYSICIAN   Bupivacaine Liposomal Suspension for Injection What is this medicine? BUPIVACAINE LIPOSOMAL (bue PIV a kane LIP oh som al) is an anesthetic. It causes loss of feeling in the skin or other tissues. It is used to prevent and to treat pain from some procedures. This medicine may be used for other purposes; ask your health care provider or pharmacist if you have questions. COMMON BRAND NAME(S): EXPAREL What should I tell my health care provider before I take this medicine? They need to know if you have any of these conditions:  G6PD  deficiency  heart disease  kidney disease  liver disease  low blood pressure  lung or breathing disease, like asthma  an unusual or allergic reaction to bupivacaine, other medicines, foods, dyes, or preservatives  pregnant or trying to get pregnant  breast-feeding How should I use this medicine? This medicine is injected into the affected area. It is given by a health care provider in a hospital or clinic setting. Talk to your health care provider about the use of this medicine in children. While it may be given to children as young as 6 years for selected conditions, precautions do apply. Overdosage: If you think you have taken too much of this medicine contact a poison control center or emergency room at once. NOTE: This medicine is only for you. Do not share this medicine with others. What if I miss a dose? This does not apply. What may interact with this medicine? This medicine may interact with the following medications:  acetaminophen  certain antibiotics like dapsone, nitrofurantoin, aminosalicylic acid, sulfonamides  certain medicines for seizures like phenobarbital, phenytoin, valproic acid  chloroquine  cyclophosphamide  flutamide  hydroxyurea  ifosfamide  metoclopramide  nitric oxide  nitroglycerin  nitroprusside  nitrous oxide  other local anesthetics like lidocaine, pramoxine, tetracaine  primaquine  quinine  rasburicase  sulfasalazine This list may not describe all possible interactions. Give your health care provider a list of all the medicines, herbs, non-prescription drugs, or dietary supplements you use. Also tell them if you smoke, drink alcohol, or use illegal drugs. Some items may interact with your medicine. What should I watch for while using this medicine? Your condition will be monitored carefully while you are receiving this medicine. Be careful to avoid injury while the area is numb, and you are not aware of pain. What side  effects may I notice from receiving this medicine? Side effects that you should report to your doctor or health care professional as soon as possible:  allergic reactions like skin rash, itching or hives, swelling of the face, lips, or tongue  seizures  signs and symptoms of a dangerous change in heartbeat or heart rhythm like chest pain; dizziness; fast, irregular heartbeat; palpitations; feeling faint or lightheaded; falls; breathing problems  signs and symptoms of methemoglobinemia such as pale, gray, or blue colored skin; headache; fast heartbeat; shortness of breath; feeling faint or lightheaded, falls; tiredness Side effects that usually do not require medical attention (report to your doctor or health care professional if they continue or are bothersome):  anxious  back pain  changes in taste  changes in vision  constipation  dizziness  fever  nausea, vomiting This list may not describe all possible side effects. Call your doctor for medical advice about side effects. You may report side effects to FDA at 1-800-FDA-1088. Where should I keep my medicine? This drug is given in a hospital or clinic and will not be stored at home. NOTE: This sheet is a summary. It may not cover all possible information. If you have questions about this medicine, talk to your doctor, pharmacist, or health care provider.  2021 Elsevier/Gold Standard (2019-07-28 12:24:57)     General Anesthesia, Adult, Care After This sheet gives you information about how to care for yourself after your procedure. Your health care provider may also give you more specific instructions. If you have problems or questions, contact your health care provider. What can I expect after the procedure? After the procedure, the following side effects are common:  Pain or discomfort at the IV site.  Nausea.  Vomiting.  Sore throat.  Trouble concentrating.  Feeling cold or chills.  Feeling weak or  tired.  Sleepiness and fatigue.  Soreness and body aches. These side effects can affect parts of the body that were not involved in surgery. Follow these instructions at home: For the time period you were told by your health care provider:  Rest.  Do not participate in activities where you could fall or become injured.  Do not drive or use machinery.  Do not drink alcohol.  Do not take sleeping pills or medicines that cause drowsiness.  Do not make important decisions or sign legal documents.  Do not take care of children on your own.   Eating and drinking  Follow any instructions from your health care provider about eating or drinking restrictions.  When you feel hungry, start by eating small amounts of foods that are soft and easy to digest (bland), such as toast.  Gradually return to your regular diet.  Drink enough fluid to keep your urine pale yellow.  If you vomit, rehydrate by drinking water, juice, or clear broth. General instructions  If you have sleep apnea, surgery and certain medicines can increase your risk for breathing problems. Follow instructions from your health care provider about wearing your sleep device: ? Anytime you are sleeping, including during daytime naps. ? While taking prescription pain medicines, sleeping medicines, or medicines that make you drowsy.  Have a responsible adult stay with you for the time you are told. It is important to have someone help care for you until you are awake and alert.  Return to your normal activities as told by your health care provider. Ask your health care provider what activities are safe for you.  Take over-the-counter and prescription medicines only as told by your health care provider.  If you smoke, do not smoke without supervision.  Keep all follow-up visits as told by your health care provider. This is important. Contact a health care provider if:  You have nausea or vomiting that does not get better  with medicine.  You cannot eat or drink without vomiting.  You have pain that does not get better with medicine.  You are unable to pass urine.  You develop a skin rash.  You have a fever.  You have redness around your IV site that gets worse. Get help right away if:  You have difficulty breathing.  You have chest pain.  You have blood in your urine or stool, or you vomit blood. Summary  After the procedure, it is common to have a sore throat or nausea. It is also common to feel tired.  Have a responsible adult stay with you for the time you are told. It is important to have someone help care for you until you are awake and alert.  When you feel hungry, start by eating small amounts of foods that are soft and easy to digest (bland), such as toast. Gradually return to your regular diet.  Drink enough fluid to keep your urine pale yellow.  Return to your normal activities as told by your health care provider. Ask your health care provider what activities are safe for you. This information is not intended to replace advice given to you by your health care provider. Make sure you discuss any questions you have with your health care provider. Document Revised: 01/05/2020 Document Reviewed: 08/04/2019 Elsevier Patient Education  2021 [place] PATCH BEHIND YOUR LEFT EAR ON Saturday August 04, 2020. [facility] HANDS AFTER REMOVAL OF PATCH AND THROW PATCH IN [facility]

## 2020-08-01 NOTE — Anesthesia Postprocedure Evaluation (Signed)
Anesthesia Post Note  Patient: April Acevedo  Procedure(s) Performed: LAPAROSCOPIC BILATERAL SALPINGO OOPHORECTOMY (Bilateral Abdomen)  Patient location during evaluation: Phase II Anesthesia Type: General Level of consciousness: awake and alert Pain management: pain level controlled Vital Signs Assessment: post-procedure vital signs reviewed and stable Respiratory status: spontaneous breathing, respiratory function stable and patient connected to nasal cannula oxygen Cardiovascular status: blood pressure returned to baseline and stable Postop Assessment: no apparent nausea or vomiting Anesthetic complications: no   No complications documented.   Last Vitals:  Vitals:   08/01/20 1045 08/01/20 1058  BP: 105/73 (P) 120/75  Pulse: (!) 57 (P) 74  Resp: 10 (P) 16  Temp:  (P) 36.6 C  SpO2: 95% (P) 100%    Last Pain:  Vitals:   08/01/20 1058  TempSrc: (P) Oral  PainSc:                  [name]

## 2020-08-01 NOTE — Anesthesia Procedure Notes (Signed)
Procedure Name: Intubation Date/Time: 08/01/2020 9:06 AM Performed by: [name], CRNA Pre-anesthesia Checklist: Patient identified, Emergency Drugs available, Suction available and Patient being monitored Patient Re-evaluated:Patient Re-evaluated prior to induction Oxygen Delivery Method: Circle system utilized Preoxygenation: Pre-oxygenation with 100% oxygen Induction Type: IV induction Ventilation: Mask ventilation without difficulty Laryngoscope Size: Mac and 3 Tube type: Oral Number of attempts: 1 Airway Equipment and Method: Stylet Placement Confirmation: ETT inserted through vocal cords under direct vision,  positive ETCO2 and breath sounds checked- equal and bilateral Secured at: 21 cm Tube secured with: Tape Dental Injury: Teeth and Oropharynx as per pre-operative assessment

## 2020-08-01 NOTE — Anesthesia Preprocedure Evaluation (Signed)
Anesthesia Evaluation  Patient identified by MRN, date of birth, ID band Patient awake    Reviewed: Allergy & Precautions, NPO status , Patient's Chart, lab work & pertinent test results, reviewed documented beta blocker date and time   History of Anesthesia Complications Negative for: history of anesthetic complications  Airway Mallampati: II  TM Distance: >3 FB Neck ROM: Full    Dental  (+) Dental Advisory Given, Teeth Intact   Pulmonary neg pulmonary ROS,    Pulmonary exam normal breath sounds clear to auscultation       Cardiovascular Exercise Tolerance: Good (-) hypertensionPt. on home beta blockers Normal cardiovascular exam+ dysrhythmias Atrial Fibrillation and Supra Ventricular Tachycardia  Rhythm:Regular Rate:Normal     Neuro/Psych  Headaches, negative psych ROS   GI/Hepatic negative GI ROS, Neg liver ROS,   Endo/Other  negative endocrine ROS  Renal/GU negative Renal ROS  negative genitourinary   Musculoskeletal  (+) Arthritis ,   Abdominal   Peds negative pediatric ROS (+)  Hematology negative hematology ROS (+)   Anesthesia Other Findings Breast cancer   Reproductive/Obstetrics                             Anesthesia Physical Anesthesia Plan  ASA: II  Anesthesia Plan: General   Post-op Pain Management:    Induction:   PONV Risk Score and Plan: Ondansetron, Dexamethasone and Midazolam  Airway Management Planned: Oral ETT  Additional Equipment:   Intra-op Plan:   Post-operative Plan: Extubation in OR  Informed Consent: I have reviewed the patients History and Physical, chart, labs and discussed the procedure including the risks, benefits and alternatives for the proposed anesthesia with the patient or authorized representative who has indicated his/her understanding and acceptance.     Dental advisory given  Plan Discussed with: CRNA and Surgeon  Anesthesia  Plan Comments:         Anesthesia Quick Evaluation

## 2020-08-01 NOTE — Transfer of Care (Signed)
Immediate Anesthesia Transfer of Care Note  Patient: April Acevedo  Procedure(s) Performed: LAPAROSCOPIC BILATERAL SALPINGO OOPHORECTOMY (Bilateral Abdomen)  Patient Location: PACU  Anesthesia Type:General  Level of Consciousness: drowsy  Airway & Oxygen Therapy: Patient Spontanous Breathing and Patient connected to nasal cannula oxygen  Post-op Assessment: Report given to RN and Post -op Vital signs reviewed and stable  Post vital signs: Reviewed and stable  Last Vitals:  Vitals Value Taken Time  BP 116/75 08/01/20 1003  Temp    Pulse 87 08/01/20 1004  Resp 11 08/01/20 1004  SpO2 95 % 08/01/20 1004  Vitals shown include unvalidated device data.  Last Pain:  Vitals:   08/01/20 0758  TempSrc: Oral  PainSc: 7       Patients Stated Pain Goal: 5 (74/[id number]/95 [id number])  Complications: No complications documented.

## 2020-08-02 ENCOUNTER — Encounter (HOSPITAL_COMMUNITY): Payer: Self-pay | Admitting: Obstetrics & Gynecology

## 2020-08-02 LAB — SURGICAL PATHOLOGY

## 2020-08-10 ENCOUNTER — Encounter: Payer: Medicare Other | Admitting: Obstetrics & Gynecology

## 2020-09-17 ENCOUNTER — Other Ambulatory Visit (HOSPITAL_COMMUNITY): Payer: Medicare Other

## 2020-09-17 ENCOUNTER — Ambulatory Visit (HOSPITAL_COMMUNITY): Payer: Medicare Other | Admitting: Hematology

## 2020-09-17 ENCOUNTER — Ambulatory Visit (HOSPITAL_COMMUNITY): Payer: Medicare Other

## 2020-09-21 ENCOUNTER — Inpatient Hospital Stay (HOSPITAL_BASED_OUTPATIENT_CLINIC_OR_DEPARTMENT_OTHER): Payer: Medicare Other | Admitting: Hematology

## 2020-09-21 ENCOUNTER — Other Ambulatory Visit: Payer: Self-pay

## 2020-09-21 ENCOUNTER — Inpatient Hospital Stay (HOSPITAL_COMMUNITY): Payer: Medicare Other

## 2020-09-21 ENCOUNTER — Encounter (HOSPITAL_COMMUNITY): Payer: Self-pay | Admitting: Hematology

## 2020-09-21 ENCOUNTER — Inpatient Hospital Stay (HOSPITAL_COMMUNITY): Payer: Medicare Other | Attending: Hematology

## 2020-09-21 VITALS — BP 109/69 | HR 62 | Temp 97.9°F | Resp 18 | Wt 124.2 lb

## 2020-09-21 DIAGNOSIS — Z170 Estrogen receptor positive status [ER+]: Secondary | ICD-10-CM | POA: Diagnosis not present

## 2020-09-21 DIAGNOSIS — E559 Vitamin D deficiency, unspecified: Secondary | ICD-10-CM

## 2020-09-21 DIAGNOSIS — C50912 Malignant neoplasm of unspecified site of left female breast: Secondary | ICD-10-CM | POA: Diagnosis present

## 2020-09-21 DIAGNOSIS — Z791 Long term (current) use of non-steroidal anti-inflammatories (NSAID): Secondary | ICD-10-CM | POA: Diagnosis not present

## 2020-09-21 DIAGNOSIS — Z79899 Other long term (current) drug therapy: Secondary | ICD-10-CM | POA: Diagnosis not present

## 2020-09-21 DIAGNOSIS — Z79811 Long term (current) use of aromatase inhibitors: Secondary | ICD-10-CM | POA: Diagnosis not present

## 2020-09-21 LAB — CBC WITH DIFFERENTIAL/PLATELET
Abs Immature Granulocytes: 0.01 10*3/uL (ref 0.00–0.07)
Basophils Absolute: 0 10*3/uL (ref 0.0–0.1)
Basophils Relative: 1 %
Eosinophils Absolute: 0.1 10*3/uL (ref 0.0–0.5)
Eosinophils Relative: 3 %
HCT: 39.3 % (ref 36.0–46.0)
Hemoglobin: 12.3 g/dL (ref 12.0–15.0)
Immature Granulocytes: 0 %
Lymphocytes Relative: 34 %
Lymphs Abs: 1.5 10*3/uL (ref 0.7–4.0)
MCH: 27.1 pg (ref 26.0–34.0)
MCHC: 31.3 g/dL (ref 30.0–36.0)
MCV: 86.6 fL (ref 80.0–100.0)
Monocytes Absolute: 0.4 10*3/uL (ref 0.1–1.0)
Monocytes Relative: 9 %
Neutro Abs: 2.4 10*3/uL (ref 1.7–7.7)
Neutrophils Relative %: 53 %
Platelets: 183 10*3/uL (ref 150–400)
RBC: 4.54 MIL/uL (ref 3.87–5.11)
RDW: 13.8 % (ref 11.5–15.5)
WBC: 4.4 10*3/uL (ref 4.0–10.5)
nRBC: 0 % (ref 0.0–0.2)

## 2020-09-21 LAB — COMPREHENSIVE METABOLIC PANEL
ALT: 13 U/L (ref 0–44)
AST: 21 U/L (ref 15–41)
Albumin: 3.9 g/dL (ref 3.5–5.0)
Alkaline Phosphatase: 57 U/L (ref 38–126)
Anion gap: 7 (ref 5–15)
BUN: 11 mg/dL (ref 6–20)
CO2: 25 mmol/L (ref 22–32)
Calcium: 9 mg/dL (ref 8.9–10.3)
Chloride: 103 mmol/L (ref 98–111)
Creatinine, Ser: 0.89 mg/dL (ref 0.44–1.00)
GFR, Estimated: >60 mL/min (ref >60)
Glucose, Bld: 98 mg/dL (ref 70–99)
Potassium: 3.6 mmol/L (ref 3.5–5.1)
Sodium: 135 mmol/L (ref 135–145)
Total Bilirubin: 0.7 mg/dL (ref 0.3–1.2)
Total Protein: 8.6 g/dL — ABNORMAL HIGH (ref 6.5–8.1)

## 2020-09-21 NOTE — Progress Notes (Signed)
Patient no longer receiving Lupron injection per Dr. Delton Coombes.

## 2020-09-21 NOTE — Progress Notes (Signed)
[facility] [address] [place] [address]   Patient Care Team: Patient, No Pcp Per (Inactive) as PCP - General (General Practice) Therese Sarah, RN as Oncology Nurse Navigator (Oncology)  SUMMARY OF ONCOLOGIC HISTORY: Oncology History  Breast cancer, stage 1, estrogen receptor positive, left (HCC)  03/15/2020 Initial Diagnosis   Breast cancer, stage 1, estrogen receptor positive, left (HCC)   03/15/2020 Cancer Staging   Staging form: Breast, AJCC 8th Edition - Clinical stage from 03/15/2020: Stage IA (cT1c, cN0, cM0, G2, ER+, PR+, HER2-) - Signed by Doreatha Massed, MD on 03/15/2020    Genetic Testing   Negative genetic testing on the CancerNext panel test.  The CancerNext gene panel offered by [employer] includes sequencing and rearrangement analysis for the following 34 genes:   APC, ATM, BARD1, BMPR1A, BRCA1, BRCA2, BRIP1, CDH1, CDK4, CDKN2A, CHEK2, DICER1, HOXB13, EPCAM, GREM1, MLH1, MRE11A, MSH2, MSH6, MUTYH, NBN, NF1, PALB2, PMS2, POLD1, POLE, PTEN, RAD50, RAD51C, RAD51D, SMAD4, SMARCA4, STK11, and TP53.  The report date is Sep 11, 2015.  Tested thorugh the [facility].     CHIEF COMPLIANT: Follow-up for left breast cancer   INTERVAL HISTORY: April Acevedo is a 45 y.o. female seen for follow-up of breast cancer.  Appetite is 100%.  Energy levels are 70%.  Reportedly had salpingo-oophorectomy done in March 2022.  She is tolerating Aromasin reasonably well.  Also taking calcium and vitamin D supplements.  Denies any new onset pains.  Denies any cough or hemoptysis.  REVIEW OF SYSTEMS:   Review of Systems  All other systems reviewed and are negative.   I have reviewed the past medical history, past surgical history, social history and family history with the patient and they are unchanged from previous note.   ALLERGIES:   has No Known Allergies.   MEDICATIONS:  Current Outpatient Medications   Medication Sig Dispense Refill  . busPIRone (BUSPAR) 7.5 MG tablet Take 1 tablet (7.5 mg total) by mouth daily. 30 tablet 6  . Calcium Carb-Cholecalciferol (CALCIUM 1000 + D PO) Take 1 tablet by mouth.    . cholecalciferol (VITAMIN D3) 25 MCG (1000 UNIT) tablet Take 1,000 Units by mouth daily.    Marland Kitchen exemestane (AROMASIN) 25 MG tablet Take 1 tablet (25 mg total) by mouth daily after breakfast. 30 tablet 6  . HYDROcodone-acetaminophen (NORCO/VICODIN) 5-325 MG tablet Take 1 tablet by mouth every 6 (six) hours as needed. 15 tablet 0  . ibuprofen (ADVIL) 800 MG tablet Take 1 tablet (800 mg total) by mouth every 8 (eight) hours as needed. (Patient taking differently: Take 800 mg by mouth 3 (three) times daily.) 30 tablet 6  . ketorolac (TORADOL) 10 MG tablet Take 1 tablet (10 mg total) by mouth every 8 (eight) hours as needed. 15 tablet 0  . leuprolide (LUPRON) 7.5 MG injection Inject 7.5 mg into the muscle every 3 (three) months.    Marland Kitchen LORazepam (ATIVAN) 0.5 MG tablet Take 0.5 mg by mouth daily as needed (anxiety).    . meclizine (ANTIVERT) 25 MG tablet Take 1 tablet (25 mg total) by mouth 3 (three) times daily as needed for dizziness. 30 tablet 0  . NONFORMULARY OR COMPOUNDED ITEM outpatient vestibular therapy. 1 each 0  . Nutritional Supplements (VITAMIN D MAINTENANCE PO) Take 1 tablet by mouth daily.    . ondansetron (ZOFRAN ODT) 8 MG disintegrating tablet Take 1 tablet (8 mg total) by mouth every 8 (eight) hours as needed  for nausea or vomiting. 8 tablet 0  . propranolol (INDERAL) 10 MG tablet Take 1 tablet (10 mg total) by mouth at bedtime. 30 tablet 0  . SUMAtriptan (IMITREX) 50 MG tablet Take 1 tablet (50 mg total) by mouth daily as needed for migraine. May repeat in 2 hours if headache persists or recurs. 15 tablet 2  . topiramate (TOPAMAX) 50 MG tablet Take 1 tablet (50 mg total) by mouth at bedtime. 30 tablet 0  . traZODone (DESYREL) 50 MG tablet Take 50 mg by mouth at bedtime.     No current  facility-administered medications for this visit.     PHYSICAL EXAMINATION: Performance status (ECOG): 0 - Asymptomatic  Vitals:   09/21/20 0914  BP: 109/69  Pulse: 62  Resp: 18  Temp: 97.9 F (36.6 C)  SpO2: 100%   Wt Readings from Last 3 Encounters:  09/21/20 124 lb 3.2 oz (56.3 kg)  07/30/20 128 lb (58.1 kg)  06/18/20 131 lb 12.8 oz (59.8 kg)   Physical Exam Vitals reviewed.  Constitutional:      Appearance: Normal appearance.  Cardiovascular:     Rate and Rhythm: Normal rate and regular rhythm.     Pulses: Normal pulses.     Heart sounds: Normal heart sounds.  Pulmonary:     Effort: Pulmonary effort is normal.     Breath sounds: Normal breath sounds.  Abdominal:     Palpations: Abdomen is soft.  Neurological:     General: No focal deficit present.     Mental Status: She is alert and oriented to person, place, and time.  Psychiatric:        Mood and Affect: Mood normal.        Behavior: Behavior normal.    Bilateral implanted breasts are within normal limits with no suspicious nodules.    LABORATORY DATA:  I have reviewed the data as listed CMP Latest Ref Rng & Units 09/21/2020 07/30/2020 06/18/2020  Glucose 70 - 99 mg/dL 98 78 98  BUN 6 - 20 mg/dL $Remove'11 11 12  'ntIzfQY$ Creatinine 0.44 - 1.00 mg/dL 0.89 0.89 0.84  Sodium 135 - 145 mmol/L 135 133(L) 134(L)  Potassium 3.5 - 5.1 mmol/L 3.6 4.2 3.7  Chloride 98 - 111 mmol/L 103 100 104  CO2 22 - 32 mmol/L $RemoveB'25 25 25  'MxOKSjuI$ Calcium 8.9 - 10.3 mg/dL 9.0 9.0 8.9  Total Protein 6.5 - 8.1 g/dL 8.6(H) 8.5(H) 8.8(H)  Total Bilirubin 0.3 - 1.2 mg/dL 0.7 0.5 0.7  Alkaline Phos 38 - 126 U/L 57 52 57  AST 15 - 41 U/L $Remo'21 26 24  'fOPSA$ ALT 0 - 44 U/L $Remo'13 18 15   'iXPot$ Lab Results  Component Value Date   CAN153 24.0 06/18/2020   Lab Results  Component Value Date   WBC 4.4 09/21/2020   HGB 12.3 09/21/2020   HCT 39.3 09/21/2020   MCV 86.6 09/21/2020   PLT 183 09/21/2020   NEUTROABS 2.4 09/21/2020    ASSESSMENT:  1.  T1CN0 left breast IDC,  ER/PR positive, HER-2 negative: -Bilateral nipple sparing mastectomy on 10/12/2015 in [facility] [place]. -Pathology showing 1.8 cm IDC, margins negative, grade 2, ER/PR positive, HER-2 negative, PT1CPN0. -Oncotype DX recurrence score of 21 -Treated with tamoxifen for a year, Lupron was added during year 2, tamoxifen continued for a total of 3 years. -Tamoxifen changed to Aromasin, Lupron monthly continued. -She recently moved to [place].  She did not receive Lupron injection for 2 months. -Genetic testing was negative  for BRCA 1 and 2 -Ultrasound on 06/09/2019 in [place] showed left axillary lymph node. -She was recently hospitalized for syncopal episode.  CT angio of the head and neck on 02/18/2020 showed 3 mm left upper lobe lung nodule. -Brain MRI on 02/19/2020 without evidence of metastatic disease.  2.  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  She lives with her son. -Paternal uncle had prostate cancer.  Paternal grandmother died of colon cancer at age 94.  Maternal cousin had ovarian cancer.  3.  Bone health: -DEXA scan on 04/26/2019 with T score of -1.6.   PLAN:  1.  T1CN0 left breast cancer: -She had salpingo-oophorectomy in March 2022. - She does not require any Lupron. - Continue Aromasin.  Reviewed labs from 09/21/2020 which showed normal LFTs.  Slightly elevated total protein 8.6.  Will check SPEP at next visit.  CBC was within normal limits.  Reviewed oophorectomy pathology which was benign. - Physical examination today reveals normal breast implants with no palpable nodules or adenopathy. - RTC 4 months with repeat labs.  2.  Bone health: -Continue calcium and vitamin D supplements.  3.  Left lung nodule: - CT angio head on 02/18/2020 showed 3 mm left lung nodule. - I have recommended CT chest without contrast to follow-up on this in October.   Breast Cancer therapy associated bone loss: I have recommended  calcium, Vitamin D and weight bearing exercises.    Orders Placed This Encounter  Procedures  . CT CHEST WO CONTRAST    Standing Status:   Future    Standing Expiration Date:   09/21/2021    Order Specific Question:   Preferred imaging location?    Answer:   [facility]    Order Specific Question:   Release to patient    Answer:   Immediate    Order Specific Question:   Is patient pregnant?    Answer:   No  . CBC with Differential/Platelet    Standing Status:   Future    Standing Expiration Date:   09/21/2021    Order Specific Question:   Release to patient    Answer:   Immediate  . Comprehensive metabolic panel    Standing Status:   Future    Standing Expiration Date:   09/21/2021    Order Specific Question:   Release to patient    Answer:   Immediate  . VITAMIN D 25 Hydroxy (Vit-D Deficiency, Fractures)    Standing Status:   Future    Standing Expiration Date:   09/21/2021    Order Specific Question:   Release to patient    Answer:   Immediate  . Protein electrophoresis, serum    Standing Status:   Future    Standing Expiration Date:   09/21/2021    Order Specific Question:   Release to patient    Answer:   Immediate   The patient has a good understanding of the overall plan. she agrees with it. she will call with any problems that may develop before the next visit here.    Derek Jack, MD [place] [phone]   I, Milinda Antis, am acting as a scribe for Dr. Sanda Linger.  I, Derek Jack MD, have reviewed the above documentation for accuracy and completeness, and I agree with the above.

## 2020-09-24 ENCOUNTER — Ambulatory Visit (HOSPITAL_COMMUNITY): Payer: Medicare Other | Admitting: Hematology

## 2021-01-14 ENCOUNTER — Ambulatory Visit (HOSPITAL_COMMUNITY)
Admission: RE | Admit: 2021-01-14 | Discharge: 2021-01-14 | Disposition: A | Discharge status: Home / Self Care | Payer: Medicare Other | Source: Ambulatory Visit | Attending: Hematology | Admitting: Hematology

## 2021-01-14 ENCOUNTER — Other Ambulatory Visit: Payer: Self-pay

## 2021-01-14 ENCOUNTER — Inpatient Hospital Stay (HOSPITAL_COMMUNITY): Payer: Medicare Other | Attending: Hematology

## 2021-01-14 DIAGNOSIS — Z170 Estrogen receptor positive status [ER+]: Secondary | ICD-10-CM | POA: Diagnosis present

## 2021-01-14 DIAGNOSIS — C50912 Malignant neoplasm of unspecified site of left female breast: Secondary | ICD-10-CM

## 2021-01-14 DIAGNOSIS — E559 Vitamin D deficiency, unspecified: Secondary | ICD-10-CM

## 2021-01-14 LAB — CBC WITH DIFFERENTIAL/PLATELET
Abs Immature Granulocytes: 0 10*3/uL (ref 0.00–0.07)
Basophils Absolute: 0 10*3/uL (ref 0.0–0.1)
Basophils Relative: 1 %
Eosinophils Absolute: 0.1 10*3/uL (ref 0.0–0.5)
Eosinophils Relative: 2 %
HCT: 39.2 % (ref 36.0–46.0)
Hemoglobin: 12.4 g/dL (ref 12.0–15.0)
Immature Granulocytes: 0 %
Lymphocytes Relative: 46 %
Lymphs Abs: 1.2 10*3/uL (ref 0.7–4.0)
MCH: 26.7 pg (ref 26.0–34.0)
MCHC: 31.6 g/dL (ref 30.0–36.0)
MCV: 84.5 fL (ref 80.0–100.0)
Monocytes Absolute: 0.3 10*3/uL (ref 0.1–1.0)
Monocytes Relative: 13 %
Neutro Abs: 1 10*3/uL — ABNORMAL LOW (ref 1.7–7.7)
Neutrophils Relative %: 38 %
Platelets: 201 10*3/uL (ref 150–400)
RBC: 4.64 MIL/uL (ref 3.87–5.11)
RDW: 13 % (ref 11.5–15.5)
WBC: 2.6 10*3/uL — ABNORMAL LOW (ref 4.0–10.5)
nRBC: 0 % (ref 0.0–0.2)

## 2021-01-14 LAB — COMPREHENSIVE METABOLIC PANEL
ALT: 11 U/L (ref 0–44)
AST: 21 U/L (ref 15–41)
Albumin: 3.9 g/dL (ref 3.5–5.0)
Alkaline Phosphatase: 52 U/L (ref 38–126)
Anion gap: 7 (ref 5–15)
BUN: 10 mg/dL (ref 6–20)
CO2: 25 mmol/L (ref 22–32)
Calcium: 8.9 mg/dL (ref 8.9–10.3)
Chloride: 104 mmol/L (ref 98–111)
Creatinine, Ser: 0.91 mg/dL (ref 0.44–1.00)
GFR, Estimated: >60 mL/min (ref >60)
Glucose, Bld: 76 mg/dL (ref 70–99)
Potassium: 3.9 mmol/L (ref 3.5–5.1)
Sodium: 136 mmol/L (ref 135–145)
Total Bilirubin: 0.5 mg/dL (ref 0.3–1.2)
Total Protein: 8.5 g/dL — ABNORMAL HIGH (ref 6.5–8.1)

## 2021-01-14 LAB — VITAMIN D 25 HYDROXY (VIT D DEFICIENCY, FRACTURES): Vit D, 25-Hydroxy: 42.1 ng/mL (ref 30–100)

## 2021-01-16 LAB — PROTEIN ELECTROPHORESIS, SERUM
A/G Ratio: 0.8 (ref 0.7–1.7)
Albumin ELP: 3.6 g/dL (ref 2.9–4.4)
Alpha-1-Globulin: 0.2 g/dL (ref 0.0–0.4)
Alpha-2-Globulin: 0.7 g/dL (ref 0.4–1.0)
Beta Globulin: 1 g/dL (ref 0.7–1.3)
Gamma Globulin: 2.7 g/dL — ABNORMAL HIGH (ref 0.4–1.8)
Globulin, Total: 4.5 g/dL — ABNORMAL HIGH (ref 2.2–3.9)
Total Protein ELP: 8.1 g/dL (ref 6.0–8.5)

## 2021-01-21 NOTE — Progress Notes (Signed)
[place] [address], [place] [address]   Patient Care Team: Patient, No Pcp Per (Inactive) as PCP - General (General Practice) Brien Mates, RN as Oncology Nurse Navigator (Oncology)  SUMMARY OF ONCOLOGIC HISTORY: Oncology History  Breast cancer, stage 1, estrogen receptor positive, left ([place])  03/15/2020 Initial Diagnosis   Breast cancer, stage 1, estrogen receptor positive, left ([place])   03/15/2020 Cancer Staging   Staging form: Breast, AJCC 8th Edition - Clinical stage from 03/15/2020: Stage IA (cT1c, cN0, cM0, G2, ER+, PR+, HER2-) - Signed by Derek Jack, MD on 03/15/2020    Genetic Testing   Negative genetic testing on the CancerNext panel test.  The CancerNext gene panel offered by [employer] includes sequencing and rearrangement analysis for the following 34 genes:   APC, ATM, BARD1, BMPR1A, BRCA1, BRCA2, BRIP1, CDH1, CDK4, CDKN2A, CHEK2, DICER1, HOXB13, EPCAM, GREM1, MLH1, MRE11A, MSH2, MSH6, MUTYH, NBN, NF1, PALB2, PMS2, POLD1, POLE, PTEN, RAD50, RAD51C, RAD51D, SMAD4, SMARCA4, STK11, and TP53.  The report date is Sep 11, 2015.  Tested thorugh the [facility].     CHIEF COMPLIANT: Follow-up for left breast cancer   INTERVAL HISTORY: Ms. April Acevedo is a 45 y.o. female here today for follow up of her left breast cancer. Her last visit was on 09/21/2020.   Today she reports feeling well. She reports hot flashes. She has a history of RA in her shoulders and fingers. She denies recent infections.  REVIEW OF SYSTEMS:   Review of Systems  Constitutional:  Positive for appetite change, chills and fever. Negative for fatigue.  HENT:   Positive for sore throat.   Endocrine: Positive for hot flashes.  Genitourinary:         Yeast infection  Skin:  Positive for itching.  Psychiatric/Behavioral:  Positive for depression and sleep disturbance. The patient is nervous/anxious.   All other systems reviewed  and are negative.  I have reviewed the past medical history, past surgical history, social history and family history with the patient and they are unchanged from previous note.   ALLERGIES:   has No Known Allergies.   MEDICATIONS:  Current Outpatient Medications  Medication Sig Dispense Refill   busPIRone (BUSPAR) 7.5 MG tablet Take 1 tablet (7.5 mg total) by mouth daily. 30 tablet 6   Calcium Carb-Cholecalciferol (CALCIUM 1000 + D PO) Take 1 tablet by mouth.     cholecalciferol (VITAMIN D3) 25 MCG (1000 UNIT) tablet Take 1,000 Units by mouth daily.     exemestane (AROMASIN) 25 MG tablet Take 1 tablet (25 mg total) by mouth daily after breakfast. 30 tablet 6   HYDROcodone-acetaminophen (NORCO/VICODIN) 5-325 MG tablet Take 1 tablet by mouth every 6 (six) hours as needed. 15 tablet 0   ibuprofen (ADVIL) 800 MG tablet Take 1 tablet (800 mg total) by mouth every 8 (eight) hours as needed. (Patient taking differently: Take 800 mg by mouth 3 (three) times daily.) 30 tablet 6   ketorolac (TORADOL) 10 MG tablet Take 1 tablet (10 mg total) by mouth every 8 (eight) hours as needed. 15 tablet 0   leuprolide (LUPRON) 7.5 MG injection Inject 7.5 mg into the muscle every 3 (three) months.     LORazepam (ATIVAN) 0.5 MG tablet Take 0.5 mg by mouth daily as needed (anxiety).     meclizine (ANTIVERT) 25 MG tablet Take 1 tablet (25 mg total) by mouth 3 (three) times daily as needed for dizziness. 30 tablet 0  NONFORMULARY OR COMPOUNDED ITEM outpatient vestibular therapy. 1 each 0   Nutritional Supplements (VITAMIN D MAINTENANCE PO) Take 1 tablet by mouth daily.     ondansetron (ZOFRAN ODT) 8 MG disintegrating tablet Take 1 tablet (8 mg total) by mouth every 8 (eight) hours as needed for nausea or vomiting. 8 tablet 0   propranolol (INDERAL) 10 MG tablet Take 1 tablet (10 mg total) by mouth at bedtime. 30 tablet 0   SUMAtriptan (IMITREX) 50 MG tablet Take 1 tablet (50 mg total) by mouth daily as needed for  migraine. May repeat in 2 hours if headache persists or recurs. 15 tablet 2   topiramate (TOPAMAX) 50 MG tablet Take 1 tablet (50 mg total) by mouth at bedtime. 30 tablet 0   traZODone (DESYREL) 50 MG tablet Take 50 mg by mouth at bedtime.     No current facility-administered medications for this visit.     PHYSICAL EXAMINATION: Performance status (ECOG): 0 - Asymptomatic  Vitals:   01/22/21 1145  BP: 106/62  Pulse: 76  Resp: 18  Temp: 99 F (37.2 C)  SpO2: 99%   Wt Readings from Last 3 Encounters:  01/22/21 122 lb 6.4 oz (55.5 kg)  09/21/20 124 lb 3.2 oz (56.3 kg)  07/30/20 128 lb (58.1 kg)   Physical Exam Vitals reviewed.  Constitutional:      Appearance: Normal appearance.  Cardiovascular:     Rate and Rhythm: Normal rate and regular rhythm.     Pulses: Normal pulses.     Heart sounds: Normal heart sounds.  Pulmonary:     Effort: Pulmonary effort is normal.     Breath sounds: Normal breath sounds.  Neurological:     General: No focal deficit present.     Mental Status: She is alert and oriented to person, place, and time.  Psychiatric:        Mood and Affect: Mood normal.        Behavior: Behavior normal.    Breast Exam Chaperone: Thana Ates     LABORATORY DATA:  I have reviewed the data as listed CMP Latest Ref Rng & Units 01/14/2021 09/21/2020 07/30/2020  Glucose 70 - 99 mg/dL 76 98 78  BUN 6 - 20 mg/dL _0 Creatinine 0.44 - 1.00 mg/dL 0.91 0.89 0.89  Sodium 135 - 145 mmol/L 136 135 133(L)  Potassium 3.5 - 5.1 mmol/L 3.9 3.6 4.2  Chloride 98 - 111 mmol/L 104 103 100  CO2 22 - 32 mmol/L _1 Calcium 8.9 - 10.3 mg/dL 8.9 9.0 9.0  Total Protein 6.5 - 8.1 g/dL 8.5(H) 8.6(H) 8.5(H)  Total Bilirubin 0.3 - 1.2 mg/dL 0.5 0.7 0.5  Alkaline Phos 38 - 126 U/L 52 57 52  AST 15 - 41 U/L _2 ALT 0 - 44 U/L _3 Lab Results  Component Value Date   CAN153 24.0 06/18/2020   Lab Results  Component Value Date   WBC 2.6 (L) 01/14/2021    HGB 12.4 01/14/2021   HCT 39.2 01/14/2021   MCV 84.5 01/14/2021   PLT 201 01/14/2021   NEUTROABS 1.0 (L) 01/14/2021    ASSESSMENT:  1.  T1CN0 left breast IDC, ER/PR positive, HER-2 negative: -Bilateral nipple sparing mastectomy on 10/12/2015 in [facility] [place]. -Pathology showing 1.8 cm IDC, margins negative, grade 2, ER/PR positive, HER-2 negative, PT1CPN0. -Oncotype DX recurrence score of 21 -Treated with tamoxifen for a year, Lupron was added during  year 2, tamoxifen continued for a total of 3 years. -Tamoxifen changed to Aromasin, Lupron monthly continued. -She recently moved to [place].  She did not receive Lupron injection for 2 months. -Genetic testing was negative for BRCA 1 and 2 -Ultrasound on 06/09/2019 in [place] showed left axillary lymph node. -She was recently hospitalized for syncopal episode.  CT angio of the head and neck on 02/18/2020 showed 3 mm left upper lobe lung nodule. -Brain MRI on 02/19/2020 without evidence of metastatic disease. - Salpingo-oophorectomy in March 2022.   2.  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  She lives with her son. -Paternal uncle had prostate cancer.  Paternal grandmother died of colon cancer at age 16.  Maternal cousin had ovarian cancer.   3.  Bone health: -DEXA scan on 04/26/2019 with T score of -1.6.   PLAN:  1.  T1CN0 left breast cancer: - She is tolerating Aromasin reasonably well. - Reviewed labs from 01/14/2021 which showed normal LFTs.  Total protein was elevated at 8.5.  SPEP was negative for M spike.  Mild leukopenia with ANC of 1.0 is stable.  Hemoglobin and platelet count was normal. - Return to clinic in 6 months with repeat labs.   2.  Bone health: - Continue calcium and vitamin D supplements.  Vitamin D level is 42.   3.  Left lung nodule: - Reviewed images of the CT chest without contrast from 01/14/2021 which showed few scattered small pulmonary nodules  throughout both lungs, largest measuring 4 mm. - She had known history of lung nodules when she was treated initially in [place]. - We will consider repeating CT scan of the chest without contrast in 6 months to confirm stability.  4.  Possible rheumatoid arthritis: - She reports having joint pains in the small joints of the hands. - We will make a referral to rheumatology, Dr. Estanislado Pandy.  Breast Cancer therapy associated bone loss: I have recommended calcium, Vitamin D and weight bearing exercises.  Orders placed this encounter:  No orders of the defined types were placed in this encounter.   The patient has a good understanding of the overall plan. She agrees with it. She will call with any problems that may develop before the next visit here.  Derek Jack, MD [place] [phone]   I, Thana Ates, am acting as a scribe for Dr. Derek Jack.  I, Derek Jack MD, have reviewed the above documentation for accuracy and completeness, and I agree with the above.

## 2021-01-22 ENCOUNTER — Other Ambulatory Visit: Payer: Self-pay

## 2021-01-22 ENCOUNTER — Inpatient Hospital Stay (HOSPITAL_BASED_OUTPATIENT_CLINIC_OR_DEPARTMENT_OTHER): Payer: Medicare Other | Admitting: Hematology

## 2021-01-22 VITALS — BP 106/62 | HR 76 | Temp 99.0°F | Resp 18 | Wt 122.4 lb

## 2021-01-22 DIAGNOSIS — Z79818 Long term (current) use of other agents affecting estrogen receptors and estrogen levels: Secondary | ICD-10-CM | POA: Diagnosis not present

## 2021-01-22 DIAGNOSIS — Z170 Estrogen receptor positive status [ER+]: Secondary | ICD-10-CM | POA: Diagnosis not present

## 2021-01-22 DIAGNOSIS — R918 Other nonspecific abnormal finding of lung field: Secondary | ICD-10-CM | POA: Insufficient documentation

## 2021-01-22 DIAGNOSIS — Z79811 Long term (current) use of aromatase inhibitors: Secondary | ICD-10-CM | POA: Insufficient documentation

## 2021-01-22 DIAGNOSIS — R911 Solitary pulmonary nodule: Secondary | ICD-10-CM

## 2021-01-22 DIAGNOSIS — C50912 Malignant neoplasm of unspecified site of left female breast: Secondary | ICD-10-CM

## 2021-01-22 DIAGNOSIS — C50919 Malignant neoplasm of unspecified site of unspecified female breast: Secondary | ICD-10-CM | POA: Diagnosis present

## 2021-01-22 DIAGNOSIS — E559 Vitamin D deficiency, unspecified: Secondary | ICD-10-CM | POA: Diagnosis not present

## 2021-01-22 MED ORDER — IBUPROFEN 800 MG PO TABS: 800.0000 mg | ORAL_TABLET | Freq: Three times a day (TID) | ORAL | 6 refills | Status: DC | PRN

## 2021-01-22 NOTE — Patient Instructions (Addendum)
[place] at [facility] Discharge Instructions  You were seen today by Dr. Delton Coombes. He went over your recent results. You will be referred to Dr. Estanislado Pandy with rheumatology for your history of rheumatoid arthritis. You will be scheduled for a CT scan of your chest prior to your next visit. Dr. Delton Coombes will see you back in 6 months for labs and follow up.   Thank you for choosing [place] at [facility] to provide your oncology and hematology care.  To afford each patient quality time with our provider, please arrive at least 15 minutes before your scheduled appointment time.   If you have a lab appointment with the [facility] please come in thru the Main Entrance and check in at the main information desk  You need to re-schedule your appointment should you arrive 10 or more minutes late.  We strive to give you quality time with our providers, and arriving late affects you and other patients whose appointments are after yours.  Also, if you no show three or more times for appointments you may be dismissed from the clinic at the providers discretion.     Again, thank you for choosing [facility].  Our hope is that these requests will decrease the amount of time that you wait before being seen by our physicians.       _____________________________________________________________  Should you have questions after your visit to [facility], please contact our office at [phone] between the hours of 8:00 a.m. and 4:30 p.m.  Voicemails left after 4:00 p.m. will not be returned until the following business day.  For prescription refill requests, have your pharmacy contact our office and allow 72 hours.    Cancer Center Support Programs:   > Cancer Support Group  2nd Tuesday of the month 1pm-2pm, Journey Room

## 2021-01-23 ENCOUNTER — Encounter (HOSPITAL_COMMUNITY): Payer: Self-pay | Admitting: *Deleted

## 2021-01-23 ENCOUNTER — Other Ambulatory Visit (HOSPITAL_COMMUNITY): Payer: Self-pay | Admitting: Hematology

## 2021-01-24 ENCOUNTER — Encounter (HOSPITAL_COMMUNITY): Payer: Self-pay | Admitting: *Deleted

## 2021-04-16 ENCOUNTER — Other Ambulatory Visit: Payer: Self-pay

## 2021-04-16 ENCOUNTER — Emergency Department (HOSPITAL_COMMUNITY)
Admission: EM | Admit: 2021-04-16 | Discharge: 2021-04-16 | Disposition: A | Discharge status: Home / Self Care | Payer: Medicare Other | Attending: Emergency Medicine | Admitting: Emergency Medicine

## 2021-04-16 ENCOUNTER — Encounter (HOSPITAL_COMMUNITY): Payer: Self-pay | Admitting: *Deleted

## 2021-04-16 DIAGNOSIS — K115 Sialolithiasis: Secondary | ICD-10-CM | POA: Diagnosis not present

## 2021-04-16 DIAGNOSIS — Z853 Personal history of malignant neoplasm of breast: Secondary | ICD-10-CM | POA: Insufficient documentation

## 2021-04-16 DIAGNOSIS — H9203 Otalgia, bilateral: Secondary | ICD-10-CM | POA: Diagnosis not present

## 2021-04-16 DIAGNOSIS — K1370 Unspecified lesions of oral mucosa: Secondary | ICD-10-CM | POA: Diagnosis present

## 2021-04-16 DIAGNOSIS — R6889 Other general symptoms and signs: Secondary | ICD-10-CM

## 2021-04-16 DIAGNOSIS — R059 Cough, unspecified: Secondary | ICD-10-CM | POA: Diagnosis not present

## 2021-04-16 MED ORDER — AMOXICILLIN-POT CLAVULANATE 875-125 MG PO TABS: 1.0000 | ORAL_TABLET | Freq: Two times a day (BID) | ORAL | 0 refills | Status: DC

## 2021-04-16 NOTE — ED Provider Notes (Signed)
[facility] Provider Note   CSN: [id number] Arrival date & time: 04/16/21  1517     History Chief Complaint  Patient presents with   Mouth Lesions    April Acevedo is a 45 y.o. female with history of breast cancer who presents to the emergency department complaining of a painful lesion under her tongue x2 days.  Patient also states that she has had a productive cough, nasal congestion, and bilateral ear pain.  She has not taken anything for her symptoms.  She states she has been eating and drinking well.  She did have 1 episode of diarrhea this morning, but no subsequent episodes.  No difficulty swallowing, or changes in her voice.   Mouth Lesions Associated symptoms: congestion and ear pain   Associated symptoms: no fever and no sore throat       Past Medical History:  Diagnosis Date   Arthritis    Atrial fibrillation (HCC)    Cancer (HCC)    Dysrhythmia    SVT and occ PVC   Thyroid nodule greater than or equal to 1.5 cm in diameter incidentally noted on imaging study 02/20/2020    Patient Active Problem List   Diagnosis Date Noted   Carcinoma of breast, estrogen and progesterone receptor positive ([place])    Genetic testing 07/30/2020   Breast cancer, stage 1, estrogen receptor positive, left ([place]) 03/15/2020   Vertigo 02/20/2020   Acute encephalopathy 02/20/2020   Unspecified atrial fibrillation ([place]) 02/20/2020   Headache 02/20/2020   Thyroid nodule greater than or equal to 1.5 cm in diameter incidentally noted on imaging study 02/20/2020   Constipation 02/20/2020   Syncope 02/18/2020    Past Surgical History:  Procedure Laterality Date   BREAST SURGERY Bilateral 2017   in [place]   LAPAROSCOPIC BILATERAL SALPINGO OOPHERECTOMY Bilateral 08/01/2020   Procedure: LAPAROSCOPIC BILATERAL SALPINGO OOPHORECTOMY;  Surgeon: Florian Buff, MD;  Location: [facility];  Service: Gynecology;  Laterality: Bilateral;     OB History     Gravida  2    Para  2   Term  0   Preterm  2   AB  0   Living  2      SAB  0   IAB  0   Ectopic  0   Multiple  0   Live Births  2           Family History  Problem Relation Age of Onset   Parkinson's disease Paternal Grandfather    Colon cancer Paternal Grandmother    Kidney disease Maternal Grandmother    Ovarian cancer Maternal Grandmother    Aneurysm Maternal Grandfather    Suicidality Father    Diabetes Mother    Hyperthyroidism Mother    Aneurysm Brother    Colon cancer Sister     Social History   Tobacco Use   Smoking status: Never   Smokeless tobacco: Never  Vaping Use   Vaping Use: Never used  Substance Use Topics   Alcohol use: Yes    Comment: rare   Drug use: Never    Home Medications Prior to Admission medications   Medication Sig Start Date End Date Taking? Authorizing Provider  amoxicillin-clavulanate (AUGMENTIN) 875-125 MG tablet Take 1 tablet by mouth every 12 (twelve) hours. 04/16/21  Yes Brinson Tozzi T, PA-C  busPIRone (BUSPAR) 7.5 MG tablet Take 1 tablet (7.5 mg total) by mouth daily. 06/18/20   Derek Jack, MD  Calcium Carb-Cholecalciferol (CALCIUM 1000 +  D PO) Take 1 tablet by mouth. 09/21/20   [provider]  cholecalciferol (VITAMIN D3) 25 MCG (1000 UNIT) tablet Take 1,000 Units by mouth daily.    [provider]  exemestane (AROMASIN) 25 MG tablet TAKE 1 TABLET(25 MG) BY MOUTH DAILY AFTER AND BREAKFAST 01/23/21   Derek Jack, MD  HYDROcodone-acetaminophen (NORCO/VICODIN) 5-325 MG tablet Take 1 tablet by mouth every 6 (six) hours as needed. 08/01/20   Florian Buff, MD  ibuprofen (ADVIL) 800 MG tablet Take 1 tablet (800 mg total) by mouth every 8 (eight) hours as needed. 01/22/21   Derek Jack, MD  ketorolac (TORADOL) 10 MG tablet Take 1 tablet (10 mg total) by mouth every 8 (eight) hours as needed. 08/01/20   Florian Buff, MD  leuprolide (LUPRON) 7.5 MG injection Inject 7.5 mg into the muscle  every 3 (three) months.    [provider]  LORazepam (ATIVAN) 0.5 MG tablet Take 0.5 mg by mouth daily as needed (anxiety).    [provider]  meclizine (ANTIVERT) 25 MG tablet Take 1 tablet (25 mg total) by mouth 3 (three) times daily as needed for dizziness. 02/29/20   Thurnell Lose, MD  NONFORMULARY OR COMPOUNDED ITEM outpatient vestibular therapy. 02/26/20   Thurnell Lose, MD  Nutritional Supplements (VITAMIN D MAINTENANCE PO) Take 1 tablet by mouth daily. 09/21/20   [provider]  ondansetron (ZOFRAN ODT) 8 MG disintegrating tablet Take 1 tablet (8 mg total) by mouth every 8 (eight) hours as needed for nausea or vomiting. 08/01/20   Florian Buff, MD  propranolol (INDERAL) 10 MG tablet Take 1 tablet (10 mg total) by mouth at bedtime. 03/01/20   Thurnell Lose, MD  SUMAtriptan (IMITREX) 50 MG tablet Take 1 tablet (50 mg total) by mouth daily as needed for migraine. May repeat in 2 hours if headache persists or recurs. 02/29/20 03/01/21  Thurnell Lose, MD  topiramate (TOPAMAX) 50 MG tablet Take 1 tablet (50 mg total) by mouth at bedtime. 02/29/20   Thurnell Lose, MD  traZODone (DESYREL) 50 MG tablet Take 50 mg by mouth at bedtime.    [provider]    Allergies    Patient has no known allergies.  Review of Systems   Review of Systems  Constitutional:  Positive for chills. Negative for appetite change and fever.  HENT:  Positive for congestion, ear pain and mouth sores. Negative for sore throat, trouble swallowing and voice change.   Respiratory:  Positive for cough.   Gastrointestinal:  Positive for diarrhea. Negative for abdominal pain, nausea and vomiting.  Neurological:  Positive for headaches.  All other systems reviewed and are negative.  Physical Exam Updated Vital Signs BP 110/74 (BP Location: Right Arm)    Pulse 76    Temp 98 F (36.7 C) (Oral)    Resp 17    Ht 5\' 4"  (1.626 m)    Wt 57.6 kg    SpO2 99%    BMI 21.80 kg/m    Physical Exam Vitals and nursing note reviewed.  Constitutional:      Appearance: Normal appearance.  HENT:     Head: Normocephalic and atraumatic.     Mouth/Throat:     Lips: Pink.     Mouth: Mucous membranes are moist.     Dentition: Normal dentition.     Pharynx: Oropharynx is clear. No pharyngeal swelling or posterior oropharyngeal erythema.     Tonsils: No tonsillar abscesses.  Comments: Edematous right salivary gland with visible stone and increased vasculature.  Eyes:     Conjunctiva/sclera: Conjunctivae normal.  Cardiovascular:     Rate and Rhythm: Normal rate and regular rhythm.  Pulmonary:     Effort: Pulmonary effort is normal. No respiratory distress.     Breath sounds: Normal breath sounds.  Abdominal:     General: There is no distension.     Palpations: Abdomen is soft.     Tenderness: There is no abdominal tenderness.  Skin:    General: Skin is warm and dry.  Neurological:     General: No focal deficit present.     Mental Status: She is alert.    ED Results / Procedures / Treatments   Labs (all labs ordered are listed, but only abnormal results are displayed) Labs Reviewed - No data to display  EKG None  Radiology No results found.  Procedures Procedures   Medications Ordered in ED Medications - No data to display  ED Course  I have reviewed the triage vital signs and the nursing notes.  Pertinent labs & imaging results that were available during my care of the patient were reviewed by me and considered in my medical decision making (see chart for details).    MDM Rules/Calculators/A&P                           Patient is 45 year old female with history of breast cancer who presents to the emergency department complaining of a painful lesion under her tongue for 2 days.  She also complaining of a productive cough, chest pain and shortness of breath with coughing.   On my exam patient is afebrile, not tachycardic, not hypoxic, and in no  acute distress.  Lung sounds are clear to auscultation in all fields.  Patient has an edematous right salivary gland with a visible stone and increased vasculature.  I see no evidence of abscess or Ludwig's angina.  Patient is tolerating p.o. well.  Patient not requiring admission or inpatient treatment for her symptoms at this time.  We will treat with over-the-counter medications for pain, and give antibiotics to prevent any infection.  Discussed with patient that her symptoms should resolve on their own, but given ENT referral if symptoms worsen.  Discussed reasons to return to the emergency department, patient agreeable to plan.  Final Clinical Impression(s) / ED Diagnoses Final diagnoses:  Salivary gland stone  Flu-like symptoms    Rx / DC Orders ED Discharge Orders          Ordered    amoxicillin-clavulanate (AUGMENTIN) 875-125 MG tablet  Every 12 hours        04/16/21 1741             Michaela Shankel T, PA-C 04/16/21 1758    Hayden Rasmussen, MD 04/17/21 1154

## 2021-04-16 NOTE — Discharge Instructions (Addendum)
You were seen in the emergency department for mouth sores.  As we discussed I think the rest of her symptoms are likely related to a viral illness.  We normally treat these with over-the-counter medications like ibuprofen or Tylenol as needed for fever pain.  I make sure that you continue to eat and drink well, get lots of rest.  I've attached some information about salivary stones. Normally to treat this we ask you to suck on lemon drops or other sour candy to stimulate the gland and help pull out the stone.  I prescribing you some antibiotics that I like you to take twice daily for the next week.  I've attached the contact information for the ENT specialist, for you to follow-up with in the next week or so if your symptoms do not improve.  Continue to monitor how you're doing and return to the ER for new or worsening symptoms such as inability to swallow, difficulty breathing, or fever despite medication.   It has been a pleasure seeing and caring for you today and I hope you start feeling better soon!

## 2021-04-16 NOTE — ED Triage Notes (Signed)
Painful lesion under tongue, also has productive cough x 2 days

## 2021-07-09 ENCOUNTER — Other Ambulatory Visit: Payer: Self-pay | Admitting: Otolaryngology

## 2021-07-09 DIAGNOSIS — K098 Other cysts of oral region, not elsewhere classified: Secondary | ICD-10-CM

## 2021-07-16 ENCOUNTER — Other Ambulatory Visit: Payer: Self-pay

## 2021-07-16 ENCOUNTER — Ambulatory Visit
Admission: RE | Admit: 2021-07-16 | Discharge: 2021-07-16 | Disposition: A | Discharge status: Home / Self Care | Payer: Medicare Other | Source: Ambulatory Visit | Attending: Otolaryngology | Admitting: Otolaryngology

## 2021-07-16 DIAGNOSIS — K098 Other cysts of oral region, not elsewhere classified: Secondary | ICD-10-CM

## 2021-07-16 MED ORDER — IOPAMIDOL (ISOVUE-300) INJECTION 61%
75.0000 mL | Freq: Once | INTRAVENOUS | Status: AC | PRN
  Administered 2021-07-16: 75 mL via INTRAVENOUS

## 2021-07-25 ENCOUNTER — Other Ambulatory Visit: Payer: Self-pay

## 2021-07-25 ENCOUNTER — Ambulatory Visit (HOSPITAL_COMMUNITY)
Admission: RE | Admit: 2021-07-25 | Discharge: 2021-07-25 | Disposition: A | Discharge status: Home / Self Care | Payer: Medicare Other | Source: Ambulatory Visit | Attending: Hematology | Admitting: Hematology

## 2021-07-25 ENCOUNTER — Inpatient Hospital Stay (HOSPITAL_COMMUNITY): Payer: Medicare Other | Attending: Hematology

## 2021-07-25 DIAGNOSIS — E559 Vitamin D deficiency, unspecified: Secondary | ICD-10-CM

## 2021-07-25 DIAGNOSIS — R911 Solitary pulmonary nodule: Secondary | ICD-10-CM | POA: Diagnosis present

## 2021-07-25 DIAGNOSIS — C50912 Malignant neoplasm of unspecified site of left female breast: Secondary | ICD-10-CM

## 2021-07-25 DIAGNOSIS — Z853 Personal history of malignant neoplasm of breast: Secondary | ICD-10-CM | POA: Diagnosis not present

## 2021-07-25 LAB — CBC WITH DIFFERENTIAL/PLATELET
Abs Immature Granulocytes: 0.01 10*3/uL (ref 0.00–0.07)
Basophils Absolute: 0 10*3/uL (ref 0.0–0.1)
Basophils Relative: 1 %
Eosinophils Absolute: 0 10*3/uL (ref 0.0–0.5)
Eosinophils Relative: 0 %
HCT: 40.2 % (ref 36.0–46.0)
Hemoglobin: 12.3 g/dL (ref 12.0–15.0)
Immature Granulocytes: 0 %
Lymphocytes Relative: 31 %
Lymphs Abs: 1.1 10*3/uL (ref 0.7–4.0)
MCH: 25 pg — ABNORMAL LOW (ref 26.0–34.0)
MCHC: 30.6 g/dL (ref 30.0–36.0)
MCV: 81.7 fL (ref 80.0–100.0)
Monocytes Absolute: 0.2 10*3/uL (ref 0.1–1.0)
Monocytes Relative: 7 %
Neutro Abs: 2.1 10*3/uL (ref 1.7–7.7)
Neutrophils Relative %: 61 %
Platelets: 208 10*3/uL (ref 150–400)
RBC: 4.92 MIL/uL (ref 3.87–5.11)
RDW: 14.5 % (ref 11.5–15.5)
WBC: 3.5 10*3/uL — ABNORMAL LOW (ref 4.0–10.5)
nRBC: 0 % (ref 0.0–0.2)

## 2021-07-25 LAB — VITAMIN D 25 HYDROXY (VIT D DEFICIENCY, FRACTURES): Vit D, 25-Hydroxy: 32.69 ng/mL (ref 30–100)

## 2021-07-25 LAB — COMPREHENSIVE METABOLIC PANEL
ALT: 16 U/L (ref 0–44)
AST: 26 U/L (ref 15–41)
Albumin: 3.9 g/dL (ref 3.5–5.0)
Alkaline Phosphatase: 62 U/L (ref 38–126)
Anion gap: 9 (ref 5–15)
BUN: 13 mg/dL (ref 6–20)
CO2: 24 mmol/L (ref 22–32)
Calcium: 9.1 mg/dL (ref 8.9–10.3)
Chloride: 104 mmol/L (ref 98–111)
Creatinine, Ser: 0.93 mg/dL (ref 0.44–1.00)
GFR, Estimated: >60 mL/min (ref >60)
Glucose, Bld: 117 mg/dL — ABNORMAL HIGH (ref 70–99)
Potassium: 3.4 mmol/L — ABNORMAL LOW (ref 3.5–5.1)
Sodium: 137 mmol/L (ref 135–145)
Total Bilirubin: 0.7 mg/dL (ref 0.3–1.2)
Total Protein: 9.1 g/dL — ABNORMAL HIGH (ref 6.5–8.1)

## 2021-07-27 LAB — CANCER ANTIGEN 15-3: CA 15-3: 24.5 U/mL (ref 0.0–25.0)

## 2021-07-30 ENCOUNTER — Inpatient Hospital Stay (HOSPITAL_BASED_OUTPATIENT_CLINIC_OR_DEPARTMENT_OTHER): Payer: Medicare Other | Admitting: Hematology

## 2021-07-30 ENCOUNTER — Other Ambulatory Visit: Payer: Self-pay

## 2021-07-30 VITALS — BP 123/81 | HR 79 | Temp 97.2°F | Resp 18 | Ht 64.0 in | Wt 126.0 lb

## 2021-07-30 DIAGNOSIS — M069 Rheumatoid arthritis, unspecified: Secondary | ICD-10-CM | POA: Diagnosis not present

## 2021-07-30 DIAGNOSIS — Z170 Estrogen receptor positive status [ER+]: Secondary | ICD-10-CM | POA: Insufficient documentation

## 2021-07-30 DIAGNOSIS — Z79811 Long term (current) use of aromatase inhibitors: Secondary | ICD-10-CM | POA: Diagnosis not present

## 2021-07-30 DIAGNOSIS — C50912 Malignant neoplasm of unspecified site of left female breast: Secondary | ICD-10-CM

## 2021-07-30 DIAGNOSIS — E559 Vitamin D deficiency, unspecified: Secondary | ICD-10-CM | POA: Diagnosis not present

## 2021-07-30 NOTE — Progress Notes (Signed)
? ?[facility] ?[address] ?[place], [place] [address] ? ? ?Patient Care Team: ?Patient, No Pcp Per (Inactive) as PCP - General (General Practice) ?Brien Mates, RN as Oncology Nurse Navigator (Oncology) ? ?SUMMARY OF ONCOLOGIC HISTORY: ?Oncology History  ?Breast cancer, stage 1, estrogen receptor positive, left ([place])  ?03/15/2020 Initial Diagnosis  ? Breast cancer, stage 1, estrogen receptor positive, left ([place]) ?  ?03/15/2020 Cancer Staging  ? Staging form: Breast, AJCC 8th Edition ?- Clinical stage from 03/15/2020: Stage IA (cT1c, cN0, cM0, G2, ER+, PR+, HER2-) - Signed by Derek Jack, MD on 03/15/2020 ? ?  ? Genetic Testing  ? Negative genetic testing on the CancerNext panel test.  The CancerNext gene panel offered by [employer] includes sequencing and rearrangement analysis for the following 34 genes:   APC, ATM, BARD1, BMPR1A, BRCA1, BRCA2, BRIP1, CDH1, CDK4, CDKN2A, CHEK2, DICER1, HOXB13, EPCAM, GREM1, MLH1, MRE11A, MSH2, MSH6, MUTYH, NBN, NF1, PALB2, PMS2, POLD1, POLE, PTEN, RAD50, RAD51C, RAD51D, SMAD4, SMARCA4, STK11, and TP53.  The report date is Sep 11, 2015.  Tested thorugh the [facility]. ?  ? ? ?CHIEF COMPLIANT: Follow-up for left breast cancer ? ? ?INTERVAL HISTORY: Ms. April Acevedo is a 46 y.o. female here today for follow up of her left breast cancer. Her last visit was on 01/22/2021.  ? ?Today she reports feeling good. She is taking exemestane and tolerating it well. She reports continued joint pains due to her RA. She denies hot flashes.  ? ?REVIEW OF SYSTEMS:   ?Review of Systems  ?Constitutional:  Negative for appetite change and fatigue.  ?HENT:   Positive for trouble swallowing.   ?Endocrine: Negative for hot flashes.  ?Musculoskeletal:  Positive for arthralgias (stable).  ?Psychiatric/Behavioral:  Positive for depression and sleep disturbance. The patient is nervous/anxious.   ?All other systems reviewed and are negative. ? ?I  have reviewed the past medical history, past surgical history, social history and family history with the patient and they are unchanged from previous note. ? ? ?ALLERGIES:   ?has No Known Allergies. ? ? ?MEDICATIONS:  ?Current Outpatient Medications  ?Medication Sig Dispense Refill  ? amoxicillin-clavulanate (AUGMENTIN) 875-125 MG tablet Take 1 tablet by mouth every 12 (twelve) hours. 14 tablet 0  ? busPIRone (BUSPAR) 7.5 MG tablet Take 1 tablet (7.5 mg total) by mouth daily. 30 tablet 6  ? Calcium Carb-Cholecalciferol (CALCIUM 1000 + D PO) Take 1 tablet by mouth.    ? cholecalciferol (VITAMIN D3) 25 MCG (1000 UNIT) tablet Take 1,000 Units by mouth daily.    ? exemestane (AROMASIN) 25 MG tablet TAKE 1 TABLET(25 MG) BY MOUTH DAILY AFTER AND BREAKFAST 30 tablet 6  ? HYDROcodone-acetaminophen (NORCO/VICODIN) 5-325 MG tablet Take 1 tablet by mouth every 6 (six) hours as needed. 15 tablet 0  ? ibuprofen (ADVIL) 800 MG tablet Take 1 tablet (800 mg total) by mouth every 8 (eight) hours as needed. 60 tablet 6  ? ketorolac (TORADOL) 10 MG tablet Take 1 tablet (10 mg total) by mouth every 8 (eight) hours as needed. 15 tablet 0  ? leuprolide (LUPRON) 7.5 MG injection Inject 7.5 mg into the muscle every 3 (three) months.    ? LORazepam (ATIVAN) 0.5 MG tablet Take 0.5 mg by mouth daily as needed (anxiety).    ? meclizine (ANTIVERT) 25 MG tablet Take 1 tablet (25 mg total) by mouth 3 (three) times daily as needed for dizziness. 30 tablet 0  ? NONFORMULARY OR COMPOUNDED  ITEM outpatient vestibular therapy. 1 each 0  ? Nutritional Supplements (VITAMIN D MAINTENANCE PO) Take 1 tablet by mouth daily.    ? propranolol (INDERAL) 10 MG tablet Take 1 tablet (10 mg total) by mouth at bedtime. 30 tablet 0  ? topiramate (TOPAMAX) 50 MG tablet Take 1 tablet (50 mg total) by mouth at bedtime. 30 tablet 0  ? traZODone (DESYREL) 50 MG tablet Take 50 mg by mouth at bedtime.    ? ondansetron (ZOFRAN ODT) 8 MG disintegrating tablet Take 1 tablet  (8 mg total) by mouth every 8 (eight) hours as needed for nausea or vomiting. (Patient not taking: Reported on 07/30/2021) 8 tablet 0  ? SUMAtriptan (IMITREX) 50 MG tablet Take 1 tablet (50 mg total) by mouth daily as needed for migraine. May repeat in 2 hours if headache persists or recurs. 15 tablet 2  ? ?No current facility-administered medications for this visit.  ? ? ? ?PHYSICAL EXAMINATION: ?Performance status (ECOG): 0 - Asymptomatic ? ?Vitals:  ? 07/30/21 1138  ?BP: 123/81  ?Pulse: 79  ?Resp: 18  ?Temp: (!) 97.2 ?F (36.2 ?C)  ?SpO2: 100%  ? ?Wt Readings from Last 3 Encounters:  ?07/30/21 126 lb (57.2 kg)  ?04/16/21 126 lb 15.8 oz (57.6 kg)  ?01/22/21 122 lb 6.4 oz (55.5 kg)  ? ?Physical Exam ?Vitals reviewed.  ?Constitutional:   ?   Appearance: Normal appearance.  ?Cardiovascular:  ?   Rate and Rhythm: Normal rate and regular rhythm.  ?   Pulses: Normal pulses.  ?   Heart sounds: Normal heart sounds.  ?Pulmonary:  ?   Effort: Pulmonary effort is normal.  ?   Breath sounds: Normal breath sounds.  ?Chest:  ?Breasts: ?   Right: No swelling, bleeding, inverted nipple, mass, nipple discharge, skin change (reconstructed breat WNL) or tenderness.  ?   Left: No swelling, bleeding, inverted nipple, mass, nipple discharge, skin change (reconstructed breast WNL) or tenderness.  ?Lymphadenopathy:  ?   Upper Body:  ?   Right upper body: Axillary adenopathy (sub cm) present. No supraclavicular adenopathy.  ?   Left upper body: Axillary adenopathy (sub cm) present. No supraclavicular adenopathy.  ?Neurological:  ?   General: No focal deficit present.  ?   Mental Status: She is alert and oriented to person, place, and time.  ?Psychiatric:     ?   Mood and Affect: Mood normal.     ?   Behavior: Behavior normal.  ? ? ?Breast Exam Chaperone: Thana Ates   ? ? ?LABORATORY DATA:  ?I have reviewed the data as listed ? ?  Latest Ref Rng & Units 07/25/2021  ?  1:17 PM 01/14/2021  ?  9:01 AM 09/21/2020  ?  8:50 AM  ?CMP  ?Glucose 70 -  99 mg/dL 117   76   98    ?BUN 6 - 20 mg/dL $Remove'13   10   11    'YcDOTTL$ ?Creatinine 0.44 - 1.00 mg/dL 0.93   0.91   0.89    ?Sodium 135 - 145 mmol/L 137   136   135    ?Potassium 3.5 - 5.1 mmol/L 3.4   3.9   3.6    ?Chloride 98 - 111 mmol/L 104   104   103    ?CO2 22 - 32 mmol/L $RemoveB'24   25   25    'lmDMMKkZ$ ?Calcium 8.9 - 10.3 mg/dL 9.1   8.9   9.0    ?Total Protein 6.5 - 8.1  g/dL 9.1   8.5   8.6    ?Total Bilirubin 0.3 - 1.2 mg/dL 0.7   0.5   0.7    ?Alkaline Phos 38 - 126 U/L 62   52   57    ?AST 15 - 41 U/L $Remo'26   21   21    'PyqyQ$ ?ALT 0 - 44 U/L $Remo'16   11   13    'WjmNl$ ? ?Lab Results  ?Component Value Date  ? MBO485 24.5 07/25/2021  ? TCN639 24.0 06/18/2020  ? ?Lab Results  ?Component Value Date  ? WBC 3.5 (L) 07/25/2021  ? HGB 12.3 07/25/2021  ? HCT 40.2 07/25/2021  ? MCV 81.7 07/25/2021  ? PLT 208 07/25/2021  ? NEUTROABS 2.1 07/25/2021  ? ? ?ASSESSMENT:  ?1.  T1CN0 left breast IDC, ER/PR positive, HER-2 negative: ?-Bilateral nipple sparing mastectomy on 10/12/2015 in [facility] [place]. ?-Pathology showing 1.8 cm IDC, margins negative, grade 2, ER/PR positive, HER-2 negative, PT1CPN0. ?-Oncotype DX recurrence score of 21 ?-Treated with tamoxifen for a year, Lupron was added during year 2, tamoxifen continued for a total of 3 years. ?-Tamoxifen changed to Aromasin, Lupron monthly continued. ?-She recently moved to [place].  She did not receive Lupron injection for 2 months. ?-Genetic testing was negative for BRCA 1 and 2 ?-Ultrasound on 06/09/2019 in [place] showed left axillary lymph node. ?-She was recently hospitalized for syncopal episode.  CT angio of the head and neck on 02/18/2020 showed 3 mm left upper lobe lung nodule. ?-Brain MRI on 02/19/2020 without evidence of metastatic disease. ?- Salpingo-oophorectomy in March 2022. ?  ?2.  Social/family history: ?-She worked as Corporate treasurer previously.  She is non-smoker.  She lives with her son. ?-Paternal uncle had prostate cancer.  Paternal grandmother died of colon cancer at age 86.   Maternal cousin had ovarian cancer. ?  ?3.  Bone health: ?-DEXA scan on 04/26/2019 with T score of -1.6. ? ? ?PLAN:  ?1.  T1CN0 left breast cancer: ?- She is tolerating the Aromasin reasonably well. ?-

## 2021-07-30 NOTE — Patient Instructions (Addendum)
[place] at [facility] ?Discharge Instructions ? ? ?You were seen and examined today by Dr. Delton Coombes. Your exam today did not reveal anything concerning. ? ?He reviewed the results of your lab work which are normal/stable.  ? ?Return as scheduled in 6 months.  ? ? ? ? ?Thank you for choosing [place] at [facility] to provide your oncology and hematology care.  To afford each patient quality time with our provider, please arrive at least 15 minutes before your scheduled appointment time.  ? ?If you have a lab appointment with the [facility] please come in thru the Main Entrance and check in at the main information desk. ? ?You need to re-schedule your appointment should you arrive 10 or more minutes late.  We strive to give you quality time with our providers, and arriving late affects you and other patients whose appointments are after yours.  Also, if you no show three or more times for appointments you may be dismissed from the clinic at the providers discretion.     ?Again, thank you for choosing [facility].  Our hope is that these requests will decrease the amount of time that you wait before being seen by our physicians.       ?_____________________________________________________________ ? ?Should you have questions after your visit to [facility], please contact our office at [phone] and follow the prompts.  Our office hours are 8:00 a.m. and 4:30 p.m. Monday - Friday.  Please note that voicemails left after 4:00 p.m. may not be returned until the following business day.  We are closed weekends and major holidays.  You do have access to a nurse 24-7, just call the main number to the clinic [phone] and do not press any options, hold on the line and a nurse will answer the phone.   ? ?For prescription refill requests, have your pharmacy contact our office and allow 72 hours.   ? ?Due to Covid, you will need to wear a  mask upon entering the hospital. If you do not have a mask, a mask will be given to you at the Main Entrance upon arrival. For doctor visits, patients may have 1 support person age 24 or older with them. For treatment visits, patients can not have anyone with them due to social distancing guidelines and our immunocompromised population.  ? ?   ?

## 2021-08-05 ENCOUNTER — Other Ambulatory Visit: Payer: Self-pay | Admitting: Otolaryngology

## 2021-08-07 NOTE — Pre-Procedure Instructions (Addendum)
Surgical Instructions ? ? ? Your procedure is scheduled on Wednesday 08/14/21. ? ? Report to [facility] Main Entrance "A" at 08:30 A.M., then check in with the Admitting office. ? Call this number if you have problems the morning of surgery: ? [phone] ? ? If you have any questions prior to your surgery date call [phone]: Open Monday-Friday 8am-4pm ? ? ? Remember: ? Do not eat or drink after midnight the night before your surgery ?  ? Take these medicines the morning of surgery with A SIP OF WATER:  ? NONE ? ? ?As of today, STOP taking any Aspirin (unless otherwise instructed by your surgeon) Aleve, Naproxen, Ibuprofen, Motrin, Advil, Goody's, BC's, all herbal medications, fish oil, and all vitamins. ? ?         ?Do not wear jewelry or makeup ?Do not wear lotions, powders, perfumes/colognes, or deodorant. ?Do not shave 48 hours prior to surgery.  Men may shave face and neck. ?Do not bring valuables to the hospital. ?Do not wear nail polish, gel polish, artificial nails, or any other type of covering on natural nails (fingers and toes) ?If you have artificial nails or gel coating that need to be removed by a nail salon, please have this removed prior to surgery. Artificial nails or gel coating may interfere with anesthesia's ability to adequately monitor your vital signs. ? ?[facility] is not responsible for any belongings or valuables. .  ? ?Do NOT Smoke (Tobacco/Vaping)  24 hours prior to your procedure ? ?If you use a CPAP at night, you may bring your mask for your overnight stay. ?  ?Contacts, glasses, hearing aids, dentures or partials may not be worn into surgery, please bring cases for these belongings ?  ?For patients admitted to the hospital, discharge time will be determined by your treatment team. ?  ?Patients discharged the day of surgery will not be allowed to drive home, and someone needs to stay with them for 24 hours. ? ? ?SURGICAL WAITING ROOM VISITATION ?Patients having surgery or a  procedure in a hospital may have two support people. ?Children under the age of 87 must have an adult with them who is not the patient. ?They may stay in the waiting area during the procedure and may switch out with other visitors. If the patient needs to stay at the hospital during part of their recovery, the visitor guidelines for inpatient rooms apply. ? ?Please refer to the [facility] website for the visitor guidelines for Inpatients (after your surgery is over and you are in a regular room).  ? ? ? ? ? ?Special instructions:   ? ?Oral Hygiene is also important to reduce your risk of infection.  Remember - BRUSH YOUR TEETH THE MORNING OF SURGERY WITH YOUR REGULAR TOOTHPASTE ? ? ?[facility] [place]- Preparing For Surgery ? ?Before surgery, you can play an important role. Because skin is not sterile, your skin needs to be as free of germs as possible. You can reduce the number of germs on your skin by washing with CHG (chlorahexidine gluconate) Soap before surgery.  CHG is an antiseptic cleaner which kills germs and bonds with the skin to continue killing germs even after washing.   ? ? ?Please do not use if you have an allergy to CHG or antibacterial soaps. If your skin becomes reddened/irritated stop using the CHG.  ?Do not shave (including legs and underarms) for at least 48 hours prior to first CHG shower. It is OK to shave your face. ? ?  Please follow these instructions carefully. ?  ? ? Shower the NIGHT BEFORE SURGERY and the MORNING OF SURGERY with CHG Soap.  ? If you chose to wash your hair, wash your hair first as usual with your normal shampoo. After you shampoo, rinse your hair and body thoroughly to remove the shampoo.  Then [employer] and genitals (private parts) with your normal soap and rinse thoroughly to remove soap. ? ?After that Use CHG Soap as you would any other liquid soap. You can apply CHG directly to the skin and wash gently with a scrungie or a clean washcloth.  ? ?Apply the CHG Soap to your body  ONLY FROM THE NECK DOWN.  Do not use on open wounds or open sores. Avoid contact with your eyes, ears, mouth and genitals (private parts). Wash Face and genitals (private parts)  with your normal soap.  ? ?Wash thoroughly, paying special attention to the area where your surgery will be performed. ? ?Thoroughly rinse your body with warm water from the neck down. ? ?DO NOT shower/wash with your normal soap after using and rinsing off the CHG Soap. ? ?Pat yourself dry with a CLEAN TOWEL. ? ?Wear CLEAN PAJAMAS to bed the night before surgery ? ?Place CLEAN SHEETS on your bed the night before your surgery ? ?DO NOT SLEEP WITH PETS. ? ? ?Day of Surgery: ? ?Take a shower with CHG soap. ?Wear Clean/Comfortable clothing the morning of surgery ?Do not apply any deodorants/lotions.   ?Remember to brush your teeth WITH YOUR REGULAR TOOTHPASTE. ? ? ? ?If you received a COVID test during your pre-op visit  it is requested that you wear a mask when out in public, stay away from anyone that may not be feeling well and notify your surgeon if you develop symptoms. If you have been in contact with anyone that has tested positive in the last 10 days please notify you surgeon. ? ?  ?Please read over the following fact sheets that you were given.  ? ?

## 2021-08-08 ENCOUNTER — Encounter (HOSPITAL_COMMUNITY): Payer: Self-pay

## 2021-08-08 ENCOUNTER — Encounter (HOSPITAL_COMMUNITY)
Admission: RE | Admit: 2021-08-08 | Discharge: 2021-08-08 | Disposition: A | Discharge status: Home / Self Care | Payer: Medicare Other | Source: Ambulatory Visit | Attending: Otolaryngology | Admitting: Otolaryngology

## 2021-08-08 ENCOUNTER — Other Ambulatory Visit: Payer: Self-pay

## 2021-08-08 DIAGNOSIS — Z01810 Encounter for preprocedural cardiovascular examination: Secondary | ICD-10-CM | POA: Insufficient documentation

## 2021-08-08 HISTORY — DX: Depression, unspecified: F32.A

## 2021-08-08 HISTORY — DX: Anxiety disorder, unspecified: F41.9

## 2021-08-08 NOTE — Progress Notes (Signed)
PCP - Patient states she does not have a PCP. She uses her oncologist Dr. Delton Coombes as her PCP.  ?Cardiologist -  ? ?PPM/ICD - n/a ?Device Orders - n/a ?Rep Notified - n/a ? ?Chest x-ray - CT Chest 01/14/21 ?EKG - 08/08/21 ?Stress Test - Patient thinks she had one done in 2019 in [place].  ?ECHO - 02/19/20 ?Cardiac Cath - denies ? ?Sleep Study - denies ?CPAP - n/a ? ?DM- denies ? ?Blood Thinner Instructions: n/a ?Aspirin Instructions: n/a ? ?Patient takes Aromasin daily. Spoke to April Hazel, PA with anesthesia who stated no need to hold med from an anesthesia standpoint but patient may want to contact surgeon to make sure surgeon does not want it held. Patient verbalized understanding.  ? ?ERAS Protcol - NPO ?PRE-SURGERY Ensure or G2- n/a ? ?COVID TEST- n/a ? ? ?Anesthesia review: No ? ?Patient denies shortness of breath, fever, cough and chest pain at PAT appointment ? ? ?All instructions explained to the patient, with a verbal understanding of the material. Patient agrees to go over the instructions while at home for a better understanding. Patient also instructed to self quarantine after being tested for COVID-19. The opportunity to ask questions was provided. ? ? ?

## 2021-08-14 ENCOUNTER — Other Ambulatory Visit: Payer: Self-pay

## 2021-08-14 ENCOUNTER — Ambulatory Visit (HOSPITAL_COMMUNITY)
Admission: RE | Admit: 2021-08-14 | Discharge: 2021-08-14 | Disposition: A | Discharge status: Home / Self Care | Payer: Medicare Other | Attending: Otolaryngology | Admitting: Otolaryngology

## 2021-08-14 ENCOUNTER — Encounter (HOSPITAL_COMMUNITY): Payer: Self-pay | Admitting: Hematology

## 2021-08-14 ENCOUNTER — Encounter (HOSPITAL_COMMUNITY): Payer: Self-pay | Admitting: Otolaryngology

## 2021-08-14 ENCOUNTER — Encounter (HOSPITAL_COMMUNITY)
Admission: RE | Disposition: A | Discharge status: Home / Self Care | Payer: Self-pay | Source: Home / Self Care | Attending: Otolaryngology

## 2021-08-14 ENCOUNTER — Other Ambulatory Visit (HOSPITAL_COMMUNITY): Payer: Self-pay

## 2021-08-14 ENCOUNTER — Ambulatory Visit (HOSPITAL_BASED_OUTPATIENT_CLINIC_OR_DEPARTMENT_OTHER): Payer: Medicare Other | Admitting: Anesthesiology

## 2021-08-14 ENCOUNTER — Ambulatory Visit (HOSPITAL_COMMUNITY): Payer: Medicare Other | Admitting: Physician Assistant

## 2021-08-14 DIAGNOSIS — K115 Sialolithiasis: Secondary | ICD-10-CM

## 2021-08-14 DIAGNOSIS — M069 Rheumatoid arthritis, unspecified: Secondary | ICD-10-CM | POA: Insufficient documentation

## 2021-08-14 DIAGNOSIS — Z853 Personal history of malignant neoplasm of breast: Secondary | ICD-10-CM | POA: Insufficient documentation

## 2021-08-14 DIAGNOSIS — I4891 Unspecified atrial fibrillation: Secondary | ICD-10-CM | POA: Insufficient documentation

## 2021-08-14 DIAGNOSIS — Z79899 Other long term (current) drug therapy: Secondary | ICD-10-CM | POA: Insufficient documentation

## 2021-08-14 DIAGNOSIS — K116 Mucocele of salivary gland: Secondary | ICD-10-CM | POA: Diagnosis present

## 2021-08-14 DIAGNOSIS — K1123 Chronic sialoadenitis: Secondary | ICD-10-CM | POA: Insufficient documentation

## 2021-08-14 DIAGNOSIS — R220 Localized swelling, mass and lump, head: Secondary | ICD-10-CM | POA: Diagnosis present

## 2021-08-14 HISTORY — PX: EXCISION ORAL TUMOR: SHX6265

## 2021-08-14 LAB — POCT PREGNANCY, URINE: Preg Test, Ur: NEGATIVE

## 2021-08-14 SURGERY — EXCISION, NEOPLASM, MOUTH
Anesthesia: General | Site: Mouth | Laterality: Right

## 2021-08-14 MED ORDER — ACETAMINOPHEN 500 MG PO TABS
1000.0000 mg | ORAL_TABLET | Freq: Once | ORAL | Status: AC
  Administered 2021-08-14: 1000 mg via ORAL
  Filled 2021-08-14: qty 2

## 2021-08-14 MED ORDER — PHENYLEPHRINE 40 MCG/ML (10ML) SYRINGE FOR IV PUSH (FOR BLOOD PRESSURE SUPPORT)
PREFILLED_SYRINGE | INTRAVENOUS | Status: DC | PRN
  Administered 2021-08-14: 80 ug via INTRAVENOUS

## 2021-08-14 MED ORDER — LACTATED RINGERS IV SOLN: INTRAVENOUS | Status: DC

## 2021-08-14 MED ORDER — KETOROLAC TROMETHAMINE 30 MG/ML IJ SOLN
INTRAMUSCULAR | Status: AC
  Filled 2021-08-14: qty 1

## 2021-08-14 MED ORDER — FENTANYL CITRATE (PF) 250 MCG/5ML IJ SOLN
INTRAMUSCULAR | Status: DC | PRN
  Administered 2021-08-14: 50 ug via INTRAVENOUS

## 2021-08-14 MED ORDER — ORAL CARE MOUTH RINSE: 15.0000 mL | Freq: Once | OROMUCOSAL | Status: AC

## 2021-08-14 MED ORDER — APREPITANT 40 MG PO CAPS: 40.0000 mg | ORAL_CAPSULE | Freq: Once | ORAL | Status: AC

## 2021-08-14 MED ORDER — HYDROMORPHONE HCL 1 MG/ML IJ SOLN
0.2500 mg | INTRAMUSCULAR | Status: DC | PRN
  Administered 2021-08-14: 0.5 mg via INTRAVENOUS

## 2021-08-14 MED ORDER — ROCURONIUM BROMIDE 10 MG/ML (PF) SYRINGE
PREFILLED_SYRINGE | INTRAVENOUS | Status: DC | PRN
  Administered 2021-08-14: 40 mg via INTRAVENOUS

## 2021-08-14 MED ORDER — HYDROMORPHONE HCL 1 MG/ML IJ SOLN
INTRAMUSCULAR | Status: DC
Start: 2021-08-14 — End: 2021-08-14
  Filled 2021-08-14: qty 1

## 2021-08-14 MED ORDER — SCOPOLAMINE 1 MG/3DAYS TD PT72
MEDICATED_PATCH | TRANSDERMAL | Status: DC
Start: 2021-08-14 — End: 2021-08-14
  Administered 2021-08-14: 1.5 mg via TRANSDERMAL
  Filled 2021-08-14: qty 1

## 2021-08-14 MED ORDER — LIDOCAINE-EPINEPHRINE 1 %-1:100000 IJ SOLN
INTRAMUSCULAR | Status: DC | PRN
  Administered 2021-08-14: 7.5 mL

## 2021-08-14 MED ORDER — KETOROLAC TROMETHAMINE 30 MG/ML IJ SOLN
30.0000 mg | Freq: Once | INTRAMUSCULAR | Status: AC | PRN
  Administered 2021-08-14: 30 mg via INTRAVENOUS

## 2021-08-14 MED ORDER — PROPOFOL 10 MG/ML IV BOLUS
INTRAVENOUS | Status: AC
  Filled 2021-08-14: qty 20

## 2021-08-14 MED ORDER — OXYCODONE HCL 5 MG/5ML PO SOLN
ORAL | Status: AC
  Filled 2021-08-14: qty 5

## 2021-08-14 MED ORDER — OXYCODONE HCL 5 MG/5ML PO SOLN
5.0000 mg | Freq: Once | ORAL | Status: AC | PRN
  Administered 2021-08-14: 5 mg via ORAL

## 2021-08-14 MED ORDER — LIDOCAINE-EPINEPHRINE 1 %-1:100000 IJ SOLN
INTRAMUSCULAR | Status: AC
  Filled 2021-08-14: qty 1

## 2021-08-14 MED ORDER — ONDANSETRON HCL 4 MG/2ML IJ SOLN
INTRAMUSCULAR | Status: DC | PRN
  Administered 2021-08-14: 4 mg via INTRAVENOUS

## 2021-08-14 MED ORDER — PROPOFOL 10 MG/ML IV BOLUS
INTRAVENOUS | Status: DC | PRN
  Administered 2021-08-14: 100 mg via INTRAVENOUS

## 2021-08-14 MED ORDER — APREPITANT 40 MG PO CAPS
ORAL_CAPSULE | ORAL | Status: AC
  Administered 2021-08-14: 40 mg via ORAL
  Filled 2021-08-14: qty 1

## 2021-08-14 MED ORDER — DEXAMETHASONE SODIUM PHOSPHATE 10 MG/ML IJ SOLN
INTRAMUSCULAR | Status: DC | PRN
  Administered 2021-08-14: 5 mg via INTRAVENOUS

## 2021-08-14 MED ORDER — SCOPOLAMINE 1 MG/3DAYS TD PT72: 1.0000 | MEDICATED_PATCH | Freq: Once | TRANSDERMAL | Status: DC

## 2021-08-14 MED ORDER — CHLORHEXIDINE GLUCONATE 0.12 % MT SOLN
15.0000 mL | Freq: Three times a day (TID) | OROMUCOSAL | 0 refills | Status: AC
  Filled 2021-08-14: qty 473, 8d supply, fill #0

## 2021-08-14 MED ORDER — LIDOCAINE 2% (20 MG/ML) 5 ML SYRINGE
INTRAMUSCULAR | Status: DC | PRN
Start: 2021-08-14 — End: 2021-08-14
  Administered 2021-08-14: 60 mg via INTRAVENOUS

## 2021-08-14 MED ORDER — DOCUSATE SODIUM 100 MG PO CAPS
100.0000 mg | ORAL_CAPSULE | Freq: Two times a day (BID) | ORAL | 0 refills | Status: AC | PRN
  Filled 2021-08-14: qty 14, 7d supply, fill #0

## 2021-08-14 MED ORDER — FENTANYL CITRATE (PF) 250 MCG/5ML IJ SOLN
INTRAMUSCULAR | Status: AC
  Filled 2021-08-14: qty 5

## 2021-08-14 MED ORDER — SUGAMMADEX SODIUM 200 MG/2ML IV SOLN
INTRAVENOUS | Status: DC | PRN
  Administered 2021-08-14: 200 mg via INTRAVENOUS

## 2021-08-14 MED ORDER — OXYMETAZOLINE HCL 0.05 % NA SOLN
NASAL | Status: AC
  Filled 2021-08-14: qty 30

## 2021-08-14 MED ORDER — CEFAZOLIN SODIUM-DEXTROSE 2-4 GM/100ML-% IV SOLN
2.0000 g | INTRAVENOUS | Status: AC
  Administered 2021-08-14: 2 g via INTRAVENOUS
  Filled 2021-08-14: qty 100

## 2021-08-14 MED ORDER — OXYMETAZOLINE HCL 0.05 % NA SOLN
NASAL | Status: DC | PRN
  Administered 2021-08-14: 2 via NASAL

## 2021-08-14 MED ORDER — OXYCODONE HCL 5 MG PO TABS: 5.0000 mg | ORAL_TABLET | Freq: Once | ORAL | Status: AC | PRN

## 2021-08-14 MED ORDER — HYDROCODONE-ACETAMINOPHEN 5-325 MG PO TABS
1.0000 | ORAL_TABLET | Freq: Four times a day (QID) | ORAL | 0 refills | Status: AC | PRN
  Filled 2021-08-14: qty 20, 5d supply, fill #0

## 2021-08-14 MED ORDER — 0.9 % SODIUM CHLORIDE (POUR BTL) OPTIME
TOPICAL | Status: DC | PRN
Start: 2021-08-14 — End: 2021-08-14
  Administered 2021-08-14: 1000 mL

## 2021-08-14 MED ORDER — AMISULPRIDE (ANTIEMETIC) 5 MG/2ML IV SOLN: 10.0000 mg | Freq: Once | INTRAVENOUS | Status: DC | PRN

## 2021-08-14 MED ORDER — CHLORHEXIDINE GLUCONATE 0.12 % MT SOLN
15.0000 mL | Freq: Once | OROMUCOSAL | Status: AC
  Administered 2021-08-14: 15 mL via OROMUCOSAL
  Filled 2021-08-14: qty 15

## 2021-08-14 MED ORDER — MEPERIDINE HCL 25 MG/ML IJ SOLN: 6.2500 mg | INTRAMUSCULAR | Status: DC | PRN

## 2021-08-14 MED ORDER — ONDANSETRON HCL 4 MG/2ML IJ SOLN: 4.0000 mg | Freq: Once | INTRAMUSCULAR | Status: DC | PRN

## 2021-08-14 MED ORDER — MIDAZOLAM HCL 2 MG/2ML IJ SOLN
INTRAMUSCULAR | Status: DC | PRN
  Administered 2021-08-14: 2 mg via INTRAVENOUS

## 2021-08-14 MED ORDER — MIDAZOLAM HCL 2 MG/2ML IJ SOLN
INTRAMUSCULAR | Status: AC
  Filled 2021-08-14: qty 2

## 2021-08-14 SURGICAL SUPPLY — 45 items
APPLIER CLIP 9.375 SM OPEN (CLIP)
ATTRACTOMAT 16X20 MAGNETIC DRP (DRAPES) ×2 IMPLANT
BAG COUNTER SPONGE SURGICOUNT (BAG) ×2 IMPLANT
BLADE SURG 15 STRL LF DISP TIS (BLADE) IMPLANT
BLADE SURG 15 STRL SS (BLADE)
CANISTER SUCT 3000ML PPV (MISCELLANEOUS) ×2 IMPLANT
CLEANER TIP ELECTROSURG 2X2 (MISCELLANEOUS) ×2 IMPLANT
CLIP APPLIE 9.375 SM OPEN (CLIP) IMPLANT
CORD BIPOLAR FORCEPS 12FT (ELECTRODE) ×2 IMPLANT
COVER SURGICAL LIGHT HANDLE (MISCELLANEOUS) ×2 IMPLANT
DERMABOND ADVANCED (GAUZE/BANDAGES/DRESSINGS)
DERMABOND ADVANCED .7 DNX12 (GAUZE/BANDAGES/DRESSINGS) IMPLANT
DRAIN JACKSON RD 7FR 3/32 (WOUND CARE) IMPLANT
DRAPE HALF SHEET 40X57 (DRAPES) IMPLANT
ELECT COATED BLADE 2.86 ST (ELECTRODE) ×2 IMPLANT
ELECT REM PT RETURN 9FT ADLT (ELECTROSURGICAL) ×2
ELECTRODE REM PT RTRN 9FT ADLT (ELECTROSURGICAL) ×1 IMPLANT
EVACUATOR SILICONE 100CC (DRAIN) IMPLANT
FORCEPS BIPOLAR SPETZLER 8 1.0 (NEUROSURGERY SUPPLIES) ×1 IMPLANT
GAUZE 4X4 16PLY ~~LOC~~+RFID DBL (SPONGE) ×4 IMPLANT
GLOVE BIO SURGEON STRL SZ7.5 (GLOVE) ×2 IMPLANT
GOWN STRL REUS W/ TWL LRG LVL3 (GOWN DISPOSABLE) ×1 IMPLANT
GOWN STRL REUS W/TWL LRG LVL3 (GOWN DISPOSABLE) ×1
KIT BASIN OR (CUSTOM PROCEDURE TRAY) ×2 IMPLANT
KIT TURNOVER KIT B (KITS) ×2 IMPLANT
NDL HYPO 25GX1X1/2 BEV (NEEDLE) IMPLANT
NEEDLE HYPO 25GX1X1/2 BEV (NEEDLE) IMPLANT
NS IRRIG 1000ML POUR BTL (IV SOLUTION) IMPLANT
PAD ARMBOARD 7.5X6 YLW CONV (MISCELLANEOUS) ×4 IMPLANT
PENCIL SMOKE EVACUATOR (MISCELLANEOUS) ×2 IMPLANT
POSITIONER HEAD DONUT 9IN (MISCELLANEOUS) IMPLANT
SPECIMEN JAR SMALL (MISCELLANEOUS) ×2 IMPLANT
SPONGE INTESTINAL PEANUT (DISPOSABLE) ×1 IMPLANT
STAPLER VISISTAT 35W (STAPLE) ×2 IMPLANT
SUT ETHIBOND 2 0 FS (SUTURE) ×2 IMPLANT
SUT SILK 2 0 SH CR/8 (SUTURE) ×1 IMPLANT
SUT SILK 3 0 REEL (SUTURE) ×2 IMPLANT
SUT VIC AB 3-0 SH 27 (SUTURE) ×1
SUT VIC AB 3-0 SH 27X BRD (SUTURE) ×1 IMPLANT
SUT VIC AB 4-0 RB1 27 (SUTURE) ×1
SUT VIC AB 4-0 RB1 27X BRD (SUTURE) ×1 IMPLANT
TOWEL GREEN STERILE FF (TOWEL DISPOSABLE) ×2 IMPLANT
TRAY ENT MC OR (CUSTOM PROCEDURE TRAY) ×2 IMPLANT
TRAY FOLEY MTR SLVR 14FR STAT (SET/KITS/TRAYS/PACK) IMPLANT
WATER STERILE IRR 1000ML POUR (IV SOLUTION) IMPLANT

## 2021-08-14 NOTE — Anesthesia Preprocedure Evaluation (Addendum)
Anesthesia Evaluation  ?Patient identified by MRN, date of birth, ID band ?Patient awake ? ? ? ?Reviewed: ?Allergy & Precautions, NPO status , Patient's Chart, lab work & pertinent test results ? ?History of Anesthesia Complications ?(+) PONV and history of anesthetic complications ? ?Airway ?Mallampati: II ? ?TM Distance: >3 FB ?Neck ROM: Full ? ? ? Dental ? ?(+) Teeth Intact, Dental Advisory Given, Chipped,  ?  ?Pulmonary ?neg pulmonary ROS,  ?  ?Pulmonary exam normal ?breath sounds clear to auscultation ? ? ? ? ? ? Cardiovascular ?Normal cardiovascular exam+ dysrhythmias (s/p ablation, no blood thinners) Atrial Fibrillation and Supra Ventricular Tachycardia  ?Rhythm:Regular Rate:Normal ? ? ?  ?Neuro/Psych ? Headaches, PSYCHIATRIC DISORDERS Anxiety Depression   ? GI/Hepatic ?negative GI ROS, Neg liver ROS,   ?Endo/Other  ?negative endocrine ROS ? Renal/GU ?negative Renal ROS  ?negative genitourinary ?  ?Musculoskeletal ? ?(+) Arthritis , Osteoarthritis,   ? Abdominal ?  ?Peds ? Hematology ?negative hematology ROS ?(+)   ?Anesthesia Other Findings ?Salivary gland stone under tongue  ? Reproductive/Obstetrics ?negative OB ROS ? ?  ? ? ? ? ? ? ? ? ? ? ? ? ? ?  ?  ? ? ? ? ? ? ?Anesthesia Physical ?Anesthesia Plan ? ?ASA: 2 ? ?Anesthesia Plan: General  ? ?Post-op Pain Management: Tylenol PO (pre-op)*  ? ?Induction: Intravenous ? ?PONV Risk Score and Plan: 4 or greater and Ondansetron, Dexamethasone, Midazolam and Treatment may vary due to age or medical condition ? ?Airway Management Planned: Nasal ETT ? ?Additional Equipment: None ? ?Intra-op Plan:  ? ?Post-operative Plan: Extubation in OR ? ?Informed Consent: I have reviewed the patients History and Physical, chart, labs and discussed the procedure including the risks, benefits and alternatives for the proposed anesthesia with the patient or authorized representative who has indicated his/her understanding and acceptance.   ? ? ? ?Dental advisory given ? ?Plan Discussed with: CRNA ? ?Anesthesia Plan Comments:   ? ? ? ? ? ?Anesthesia Quick Evaluation ? ?

## 2021-08-14 NOTE — Transfer of Care (Signed)
Immediate Anesthesia Transfer of Care Note ? ?Patient: April Acevedo ? ?Procedure(s) Performed: TRANSORAL EXCISION OF LEFT RANULA (SUBLINGUAL GLAND) (Right: Mouth) ? ?Patient Location: PACU ? ?Anesthesia Type:General ? ?Level of Consciousness: awake, alert  and oriented ? ?Airway & Oxygen Therapy: Patient Spontanous Breathing ? ?Post-op Assessment: Report given to RN, Post -op Vital signs reviewed and stable and Patient moving all extremities ? ?Post vital signs: Reviewed and stable ? ?Last Vitals:  ?Vitals Value Taken Time  ?BP 118/69 08/14/21 1245  ?Temp 36.5 ?C 08/14/21 1245  ?Pulse 76 08/14/21 1251  ?Resp 13 08/14/21 1251  ?SpO2 96 % 08/14/21 1251  ?Vitals shown include unvalidated device data. ? ?Last Pain:  ?Vitals:  ? 08/14/21 1245  ?TempSrc:   ?PainSc: Asleep  ?   ? ?  ? ?Complications: No notable events documented. ?

## 2021-08-14 NOTE — H&P (Signed)
April Acevedo is an 46 y.o. female.   ? ?Chief Complaint:  Mass of floor of mouth on right ? ?HPI: Patient presents today for planned elective procedure.  She denies any interval change in history since office visit on 07/08/2021: ? ?April Acevedo is a 46 y.o. female who presents as a new patient, referred by Hayden Rasmussen, MD, for evaluation and treatment of possible salivary gland stone. Patient was seen at [facility] emergency for her symptoms in December. At that time, patient was counseled that she had physical exam findings concerning for sialolith without evidence of sialoadenitis. She was advised to continue OTC anti-inflammatories and was given a prescription for oral antibiotics to rule out infectious process. Patient states that since that time, the area has not changed. She endorses occasional discomfort in association with the lesion. She denies any submandibular swelling or edema. She does note intermittent bilateral parotid edema, which seems to sporadically occur and then resolves a few days later. Patient has history of rheumatoid arthritis. She also endorses dry mouth, dry eyes and dry vagina. She has not been evaluated for or diagnosed with Sjogren's. She recently moved to [place] from [place], and has not yet established with a rheumatologist in the area. She has not had any imaging of her head or sinuses. She also reports history of breast cancer, but has not been treated with radiation therapy. ? ?Past Medical History:  ?Diagnosis Date  ? Anxiety   ? Arthritis   ? Atrial fibrillation ([place])   ? Cancer [facility])   ? Depression   ? Dysrhythmia   ? SVT and occ PVC  ? Thyroid nodule greater than or equal to 1.5 cm in diameter incidentally noted on imaging study 02/20/2020  ? ? ?Past Surgical History:  ?Procedure Laterality Date  ? ABLATION  2019  ? BREAST SURGERY Bilateral 2017  ? in [place]  ? LAPAROSCOPIC BILATERAL SALPINGO OOPHERECTOMY Bilateral 08/01/2020  ? Procedure:  LAPAROSCOPIC BILATERAL SALPINGO OOPHORECTOMY;  Surgeon: Florian Buff, MD;  Location: [facility];  Service: Gynecology;  Laterality: Bilateral;  ? ? ?Family History  ?Problem Relation Age of Onset  ? Parkinson's disease Paternal Grandfather   ? Colon cancer Paternal Grandmother   ? Kidney disease Maternal Grandmother   ? Ovarian cancer Maternal Grandmother   ? Aneurysm Maternal Grandfather   ? Suicidality Father   ? Diabetes Mother   ? Hyperthyroidism Mother   ? Aneurysm Brother   ? Colon cancer Sister   ? ? ?Social History:  reports that she has never smoked. She has never used smokeless tobacco. She reports current alcohol use. She reports that she does not use drugs. ? ?Allergies: No Known Allergies ? ?Medications Prior to Admission  ?Medication Sig Dispense Refill  ? exemestane (AROMASIN) 25 MG tablet TAKE 1 TABLET(25 MG) BY MOUTH DAILY AFTER AND BREAKFAST 30 tablet 6  ? ibuprofen (ADVIL) 800 MG tablet Take 1 tablet (800 mg total) by mouth every 8 (eight) hours as needed. (Patient taking differently: Take 800 mg by mouth in the morning.) 60 tablet 6  ? NONFORMULARY OR COMPOUNDED ITEM outpatient vestibular therapy. 1 each 0  ? ? ?No results found for this or any previous visit (from the past 48 hour(s)). ?No results found. ? ?ROS: ROS ? ?There were no vitals taken for this visit. ? ?PHYSICAL EXAM: ?Physical Exam ?Constitutional:   ?   Appearance: Normal appearance.  ?HENT:  ?   Head: Normocephalic and atraumatic.  ?   Mouth/Throat:  ?  Mouth: Mucous membranes are moist.  ?Pulmonary:  ?   Effort: Pulmonary effort is normal.  ?Neurological:  ?   General: No focal deficit present.  ?   Mental Status: She is alert and oriented to person, place, and time.  ?Psychiatric:     ?   Mood and Affect: Mood normal.     ?   Behavior: Behavior normal.  ? ? ?Studies Reviewed: CT neck with contrast reviewed ? ? ?Assessment/Plan ?Zeynab Klett is a 47 y.o. female with several month history of cystic mass lesion in the right  floor of mouth. Imaging consistent with ranula. To OR today for definitive excision. Risks, including recurrence and injury to surrounding structures, benefits of surgery, as well as expected postoperative course and recovery were reviewed with patient, who expressed understanding and agreement.  ? ?Hallelujah Wysong A Tanazia Achee ?08/14/2021, 8:34 AM ? ? ? ?

## 2021-08-14 NOTE — Brief Op Note (Signed)
08/14/2021 ? ?12:34 PM ? ?PATIENT:  April Acevedo  45 y.o. female ? ?PRE-OPERATIVE DIAGNOSIS:  Salivary gland stone under the tongue ? ?POST-OPERATIVE DIAGNOSIS:  Salivary gland stone under the tongue ? ?PROCEDURE:  Procedure(s): ?TRANSORAL EXCISION OF LEFT RANULA (SUBLINGUAL GLAND) (Right) ? ?SURGEON:  Surgeon(s) and Role: ?   * Nastasha Reising A, DO - Primary ? ?PHYSICIAN ASSISTANT:  ? ?ASSISTANTS: RNFA  ? ?ANESTHESIA:   general ? ?EBL:  48m  ? ?BLOOD ADMINISTERED:none ? ?DRAINS: none  ? ?LOCAL MEDICATIONS USED:  LIDOCAINE  ? ?SPECIMEN:  Source of Specimen:  Right floor of mouth ? ?DISPOSITION OF SPECIMEN:  PATHOLOGY ? ?COUNTS:  YES ? ?TOURNIQUET:  * No tourniquets in log * ? ?DICTATION: .Note written in EPIC ? ?PLAN OF CARE: Discharge to home after PACU ? ?PATIENT DISPOSITION:  PACU - hemodynamically stable. ?  ?Delay start of Pharmacological VTE agent (>24hrs) due to surgical blood loss or risk of bleeding: not applicable ? ?

## 2021-08-14 NOTE — Op Note (Signed)
OPERATIVE NOTE ? ?April Acevedo Date/Time of Admission: 08/14/2021  8:20 AM  ?CSN: [id number];[id number] Attending Provider: No att. providers found ?Room/Bed: MCPO/NONE DOB: June 09, 1975 Age: 46 y.o. ? ? ?Pre-Op Diagnosis: ?Salivary gland stone under the tongue ? ?Post-Op Diagnosis: ?Salivary gland stone under the tongue ? ?Procedure: ?Procedure(s): ?TRANSORAL EXCISION OF LEFT RANULA (SUBLINGUAL GLAND) ? ?Anesthesia: ?General ? ?Surgeon(s): ?Aldrich, DO ? ?Assist: Rozell Searing, RN ? ?Staff: ?Circulator: Hal Morales, RN ?Relief Circulator: Rulon Abide, RN ?Scrub Person: Brayton El, RN; Celene Squibb, RN ?RN First Assistant: Rozell Searing, RN ? ?Implants: ?* No implants in log * ? ?Specimens: ?ID Type Source Tests Collected by Time Destination  ?1 : Right Floor of Mouth Tissue PATH ENT excision SURGICAL PATHOLOGY April Levay A, DO 08/14/2021 1206   ? ? ?Complications: ?None ? ?EBL: ?10 ML ? ?Condition: ?stable ? ?Operative Findings:  ?Cystic mass of right floor of mouth, measuring approximately 1cm ? ?Description of Operation: ?Once the operative consent was obtained and the site and surgery were confirmed with the patient and the operating room team, the patient was brought back to the operating room and there was smooth induction of general nasotracheal anesthesia.  She was prepped and draped in clean fashion and attention was turned to the oral cavity.  [name] was utilized to allow visualization of the oral cavity.  [name] was placed at the tongue tip to allow gentle tongue retraction.  [name] was then used to cannulate the submandibular duct to prevent inadvertent injury and to aid with duct identification.  Local anesthesia using 1% lidocaine with 1 100,000 epinephrine was injected into the mucosa overlying the sublingual gland.  An elliptical incision was planned through the floor the mouth mucosa overlying the ranula using  monopolar cautery with [name].  The floor the mouth mucosa was then used to aid in retraction and prevent disruption of the wall.  2-0 silk sutures were used on each side of the mucosal flap to aid in retraction.  Using blunt dissection, the medial boundary of the cyst was delineated.  Careful dissection was utilized to avoid injury to the nearby lingual nerve and submandibular duct, which were identified and noted to be intact.  Once the medial border of the ranula was free, dissection proceeded laterally towards the sublingual gland of origin.  The ranula was bluntly dissected off the underlying mylohyoid muscle.  The vascular pedicle to the sublingual gland was located and ligated with suture.  After this, the specimen was completely freed and sent to the pathologist for further evaluation.  The wound was copiously irrigated and the floor of mouth mucosa was closed using 4-0 chromic suture. ? ?Assistance was required throughout the surgical procedure including surgical planning, retraction, management of bleeding and surgical decision-making throughout the operation. ? ? ?Palmer, DO ?[facility]  ?08/14/2021   ? ?

## 2021-08-14 NOTE — Anesthesia Postprocedure Evaluation (Signed)
Anesthesia Post Note ? ?Patient: Neaveh Ayala-Torres ? ?Procedure(s) Performed: TRANSORAL EXCISION OF LEFT RANULA (SUBLINGUAL GLAND) (Right: Mouth) ? ?  ? ?Patient location during evaluation: PACU ?Anesthesia Type: General ?Level of consciousness: awake and alert, oriented and patient cooperative ?Pain management: pain level controlled ?Vital Signs Assessment: post-procedure vital signs reviewed and stable ?Respiratory status: spontaneous breathing, nonlabored ventilation and respiratory function stable ?Cardiovascular status: blood pressure returned to baseline and stable ?Postop Assessment: no apparent nausea or vomiting ?Anesthetic complications: no ? ? ?No notable events documented. ? ?Last Vitals:  ?Vitals:  ? 08/14/21 1330 08/14/21 1337  ?BP: 102/69 112/79  ?Pulse: 70 63  ?Resp: 11 13  ?Temp:  36.7 ?C  ?SpO2: 99% 97%  ?  ?Last Pain:  ?Vitals:  ? 08/14/21 1337  ?TempSrc:   ?PainSc: 4   ? ? ?  ?  ?  ?  ?  ?  ? ?Jarome Matin Froilan Mclean ? ? ? ? ?

## 2021-08-14 NOTE — Anesthesia Procedure Notes (Addendum)
Procedure Name: Intubation ?Date/Time: 08/14/2021 11:32 AM ?Performed by: Amadeo Garnet, CRNA ?Pre-anesthesia Checklist: Patient identified, Emergency Drugs available, Suction available and Patient being monitored ?Patient Re-evaluated:Patient Re-evaluated prior to induction ?Oxygen Delivery Method: Circle system utilized ?Preoxygenation: Pre-oxygenation with 100% oxygen ?Induction Type: IV induction ?Ventilation: Mask ventilation without difficulty ?Laryngoscope Size: Mac and 3 ?Grade View: Grade I ?Nasal Tubes: Nasal prep performed and Nasal Dwyane Luo ?Tube size: 7.0 mm ?Placement Confirmation: ETT inserted through vocal cords under direct vision, positive ETCO2 and breath sounds checked- equal and bilateral ?Secured at: 25 cm ?Tube secured with: Tape ?Dental Injury: Teeth and Oropharynx as per pre-operative assessment  ?Comments: Dilated with 32 F nasal trumpet and placed in R nares ? ? ? ? ?

## 2021-08-15 ENCOUNTER — Encounter (HOSPITAL_COMMUNITY): Payer: Self-pay | Admitting: Otolaryngology

## 2021-08-15 LAB — SURGICAL PATHOLOGY

## 2021-10-01 ENCOUNTER — Emergency Department (HOSPITAL_COMMUNITY): Payer: Medicare Other

## 2021-10-01 ENCOUNTER — Emergency Department (HOSPITAL_COMMUNITY)
Admission: EM | Admit: 2021-10-01 | Discharge: 2021-10-01 | Disposition: A | Discharge status: Home / Self Care | Payer: Medicare Other | Attending: Emergency Medicine | Admitting: Emergency Medicine

## 2021-10-01 ENCOUNTER — Encounter (HOSPITAL_COMMUNITY): Payer: Self-pay | Admitting: Emergency Medicine

## 2021-10-01 ENCOUNTER — Other Ambulatory Visit: Payer: Self-pay

## 2021-10-01 DIAGNOSIS — R1012 Left upper quadrant pain: Secondary | ICD-10-CM | POA: Diagnosis not present

## 2021-10-01 DIAGNOSIS — R0781 Pleurodynia: Secondary | ICD-10-CM | POA: Insufficient documentation

## 2021-10-01 LAB — COMPREHENSIVE METABOLIC PANEL
ALT: 23 U/L (ref 0–44)
AST: 34 U/L (ref 15–41)
Albumin: 3.8 g/dL (ref 3.5–5.0)
Alkaline Phosphatase: 69 U/L (ref 38–126)
Anion gap: 5 (ref 5–15)
BUN: 9 mg/dL (ref 6–20)
CO2: 29 mmol/L (ref 22–32)
Calcium: 9 mg/dL (ref 8.9–10.3)
Chloride: 102 mmol/L (ref 98–111)
Creatinine, Ser: 0.81 mg/dL (ref 0.44–1.00)
GFR, Estimated: >60 mL/min (ref >60)
Glucose, Bld: 89 mg/dL (ref 70–99)
Potassium: 4 mmol/L (ref 3.5–5.1)
Sodium: 136 mmol/L (ref 135–145)
Total Bilirubin: 0.4 mg/dL (ref 0.3–1.2)
Total Protein: 8.5 g/dL — ABNORMAL HIGH (ref 6.5–8.1)

## 2021-10-01 LAB — CBC
HCT: 40.2 % (ref 36.0–46.0)
Hemoglobin: 12.6 g/dL (ref 12.0–15.0)
MCH: 26.6 pg (ref 26.0–34.0)
MCHC: 31.3 g/dL (ref 30.0–36.0)
MCV: 84.8 fL (ref 80.0–100.0)
Platelets: 200 10*3/uL (ref 150–400)
RBC: 4.74 MIL/uL (ref 3.87–5.11)
RDW: 14 % (ref 11.5–15.5)
WBC: 3.2 10*3/uL — ABNORMAL LOW (ref 4.0–10.5)
nRBC: 0 % (ref 0.0–0.2)

## 2021-10-01 LAB — URINALYSIS, ROUTINE W REFLEX MICROSCOPIC
Bilirubin Urine: NEGATIVE
Glucose, UA: NEGATIVE mg/dL
Hgb urine dipstick: NEGATIVE
Ketones, ur: NEGATIVE mg/dL
Leukocytes,Ua: NEGATIVE
Nitrite: NEGATIVE
Protein, ur: NEGATIVE mg/dL
Specific Gravity, Urine: 1.009 (ref 1.005–1.030)
pH: 7 (ref 5.0–8.0)

## 2021-10-01 LAB — LIPASE, BLOOD: Lipase: 37 U/L (ref 11–51)

## 2021-10-01 LAB — POC URINE PREG, ED: Preg Test, Ur: NEGATIVE

## 2021-10-01 MED ORDER — IOHEXOL 300 MG/ML  SOLN
100.0000 mL | Freq: Once | INTRAMUSCULAR | Status: AC | PRN
  Administered 2021-10-01: 80 mL via INTRAVENOUS

## 2021-10-01 MED ORDER — TRAMADOL HCL 50 MG PO TABS: 50.0000 mg | ORAL_TABLET | Freq: Four times a day (QID) | ORAL | 0 refills | Status: DC | PRN

## 2021-10-01 MED ORDER — FENTANYL CITRATE PF 50 MCG/ML IJ SOSY
50.0000 ug | PREFILLED_SYRINGE | Freq: Once | INTRAMUSCULAR | Status: AC
  Administered 2021-10-01: 50 ug via INTRAVENOUS
  Filled 2021-10-01: qty 1

## 2021-10-01 MED ORDER — ONDANSETRON HCL 4 MG/2ML IJ SOLN
4.0000 mg | Freq: Once | INTRAMUSCULAR | Status: AC
  Administered 2021-10-01: 4 mg via INTRAVENOUS
  Filled 2021-10-01: qty 2

## 2021-10-01 NOTE — ED Provider Notes (Signed)
[facility] ([facility]) EMERGENCY DEPARTMENT Provider Note   CSN: [id number] Arrival date & time: 10/01/21  1625     History {Add pertinent medical, surgical, social history, OB history to HPI:1} Chief Complaint  Patient presents with   Abdominal Pain    April Acevedo is a 46 y.o. female who presents emergency department chief complaint of left upper quadrant abdominal pain and pleuritic left chest pain.  She has past medical history of breast cancer and is status postmastectomy.  Patient states that 8 days ago she developed pain in the left upper quadrant of her abdomen that has been now radiating up into her left chest.  She complains of sharp, pleuritic chest pain which is worse with breathing.  She denies any exertional dyspnea or hemoptysis.  She denies rash or blisters.  Patient states that she thought maybe she had a virus because she had a lot of family in for Mother's Day and there were couple people who felt unwell.   Abdominal Pain     Home Medications Prior to Admission medications   Medication Sig Start Date End Date Taking? Authorizing Provider  exemestane (AROMASIN) 25 MG tablet TAKE 1 TABLET(25 MG) BY MOUTH DAILY AFTER AND BREAKFAST 01/23/21   Derek Jack, MD  ibuprofen (ADVIL) 800 MG tablet Take 1 tablet (800 mg total) by mouth every 8 (eight) hours as needed. Patient taking differently: Take 800 mg by mouth in the morning. 01/22/21   Derek Jack, MD  NONFORMULARY OR COMPOUNDED ITEM outpatient vestibular therapy. 02/26/20   Thurnell Lose, MD      Allergies    Patient has no known allergies.    Review of Systems   Review of Systems  Gastrointestinal:  Positive for abdominal pain.   Physical Exam Updated Vital Signs BP 103/69 (BP Location: Right Arm)   Pulse 62   Temp 98 F (36.7 C) (Oral)   Resp 17   Ht '5\' 4"'$  (1.626 m)   Wt 55.8 kg   SpO2 100%   BMI 21.11 kg/m  Physical Exam Vitals and nursing note reviewed.  Constitutional:       General: She is not in acute distress.    Appearance: She is well-developed. She is not diaphoretic.  HENT:     Head: Normocephalic and atraumatic.     Right Ear: External ear normal.     Left Ear: External ear normal.     Nose: Nose normal.     Mouth/Throat:     Mouth: Mucous membranes are moist.  Eyes:     General: No scleral icterus.    Conjunctiva/sclera: Conjunctivae normal.  Cardiovascular:     Rate and Rhythm: Normal rate and regular rhythm.     Heart sounds: Normal heart sounds. No murmur heard.   No friction rub. No gallop.  Pulmonary:     Effort: Pulmonary effort is normal. No respiratory distress.     Breath sounds: Normal breath sounds.  Chest:     Chest wall: Tenderness present.  Breasts:    Right: Normal. No swelling, bleeding, mass, nipple discharge, skin change or tenderness.     Left: Normal. No swelling, bleeding, mass, nipple discharge, skin change or tenderness.    Abdominal:     General: Bowel sounds are normal. There is no distension.     Palpations: Abdomen is soft. There is no mass.     Tenderness: There is abdominal tenderness in the left upper quadrant. There is no guarding.  Musculoskeletal:     Cervical  back: Normal range of motion.  Skin:    General: Skin is warm and dry.  Neurological:     Mental Status: She is alert and oriented to person, place, and time.  Psychiatric:        Behavior: Behavior normal.    ED Results / Procedures / Treatments   Labs (all labs ordered are listed, but only abnormal results are displayed) Labs Reviewed  COMPREHENSIVE METABOLIC PANEL - Abnormal; Notable for the following components:      Result Value   Total Protein 8.5 (*)    All other components within normal limits  CBC - Abnormal; Notable for the following components:   WBC 3.2 (*)    All other components within normal limits  POC URINE PREG, ED - Normal  LIPASE, BLOOD  URINALYSIS, ROUTINE W REFLEX MICROSCOPIC    EKG None  Radiology No results  found.  Procedures Procedures  {Document cardiac monitor, telemetry assessment procedure when appropriate:1}  Medications Ordered in ED Medications - No data to display  ED Course/ Medical Decision Making/ A&P                           Medical Decision Making Amount and/or Complexity of Data Reviewed Labs: ordered.   ***  {Document critical care time when appropriate:1} {Document review of labs and clinical decision tools ie heart score, Chads2Vasc2 etc:1}  {Document your independent review of radiology images, and any outside records:1} {Document your discussion with family members, caretakers, and with consultants:1} {Document social determinants of health affecting pt's care:1} {Document your decision making why or why not admission, treatments were needed:1} Final Clinical Impression(s) / ED Diagnoses Final diagnoses:  None    Rx / DC Orders ED Discharge Orders     None

## 2021-10-01 NOTE — Discharge Instructions (Signed)
Contact a health care provider if: You have pain that does not get better with medicine. Get help right away if: You have severe chest pain that is getting worse. You have trouble breathing. You have a sudden, severe headache and a stiff neck.

## 2021-10-01 NOTE — ED Triage Notes (Signed)
Pt having abdominal pain, radiating to left ribcage and back.

## 2021-11-22 IMAGING — MR MR HEAD WO/W CM
14 of 16 series · 40 of 48 positions shown · IV contrast (Gadavist)
Comparison: CT head and CTA head and neck yesterday.

CLINICAL DATA: 44-year-old female with code stroke presentation
yesterday. History of atrial fibrillation, breast cancer. No tPA
administered.

EXAM:
MRI HEAD WITHOUT AND WITH CONTRAST
TECHNIQUE: Multiplanar, multiecho pulse sequences of the brain and surrounding
structures were obtained without and with intravenous contrast.
CONTRAST:  5.5mL GADAVIST GADOBUTROL 1 MMOL/ML IV SOLN

[Series 5: DWI · axial · 3.0mm · 0.88mm/px · z∈[-135,-3]mm · 6 of 96 slices shown (1 of 4)]
[im 1/96]
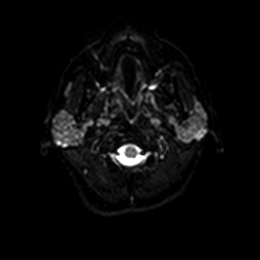
[im 20/96]
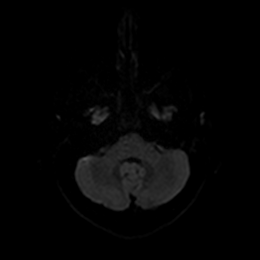
[im 39/96]
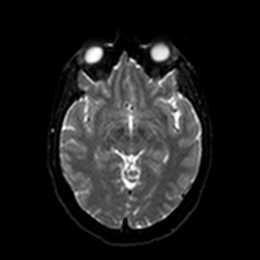
[im 58/96]
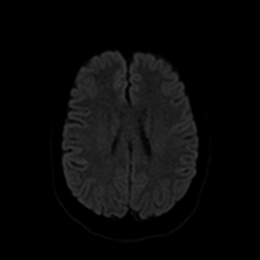
[im 77/96]
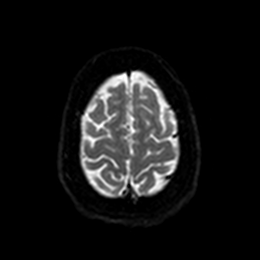
[im 96/96]
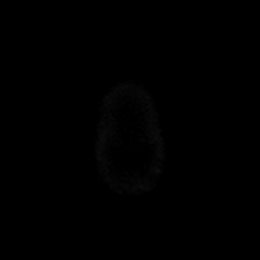

[Series 6: DWI · axial · 3.0mm · 0.88mm/px · z∈[-135,-3]mm · 2 of 48 slices shown (2 of 4)]
[im 1/48]
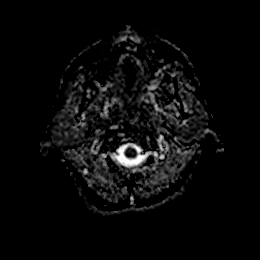
[im 48/48]
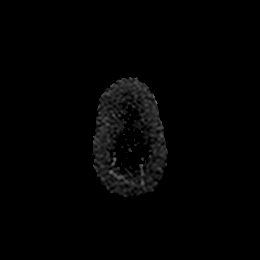

[Series 7: DWI · coronal · 4.0mm · 0.88mm/px · 3 of 66 slices shown (3 of 4)]
[im 1/66]
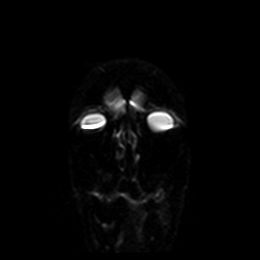
[im 33/66]
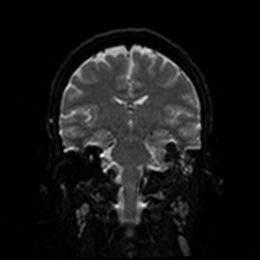
[im 66/66]
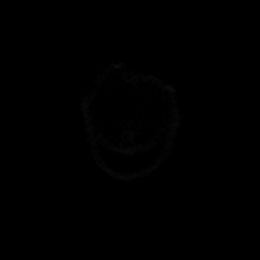

[Series 8: DWI · coronal · 4.0mm · 0.88mm/px · 2 of 33 slices shown (4 of 4)]
[im 1/33]
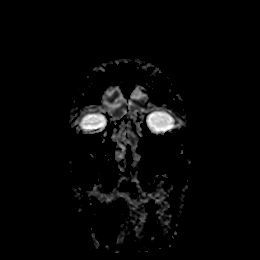
[im 33/33]
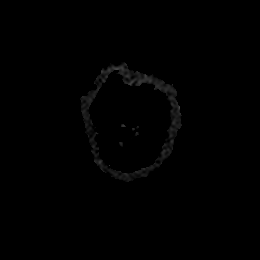

[Series 9: T1 · sagittal · 5.0mm · 0.72mm/px · 2 of 25 slices shown]
[im 1/25]
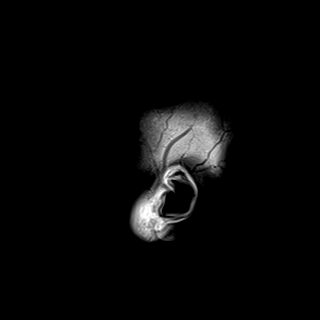
[im 25/25]
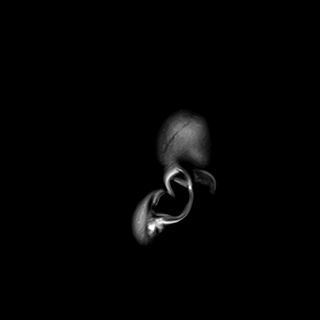

[Series 10: T2 · axial · 5.0mm · 0.72mm/px · z∈[-138,-3]mm · 2 of 25 slices shown]
[im 1/25]
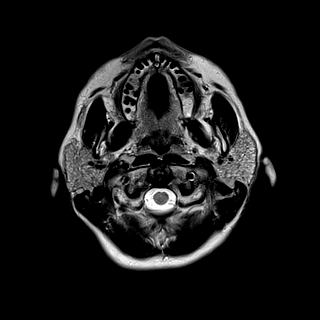
[im 25/25]
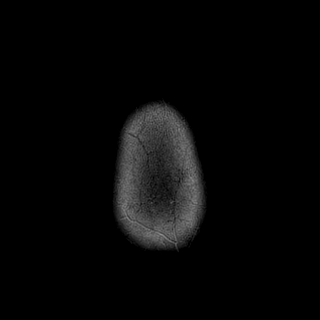

[Series 11: FLAIR · axial · 5.0mm · 0.90mm/px · z∈[-138,-3]mm · 2 of 25 slices shown]
[im 1/25]
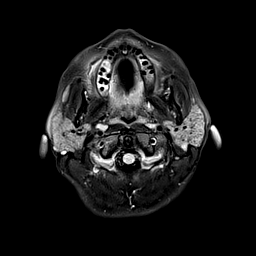
[im 25/25]
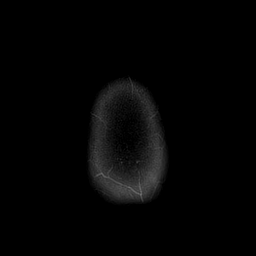

[Series 12: mag_images · axial · 3.0mm · 0.90mm/px · z∈[-142,+2]mm · 4 of 52 slices shown]
[im 1/52]
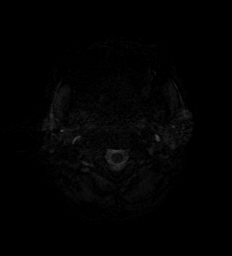
[im 18/52]
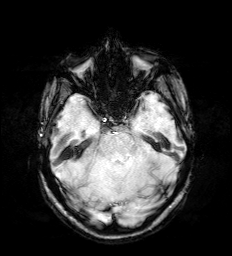
[im 35/52]
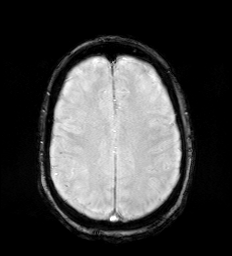
[im 52/52]
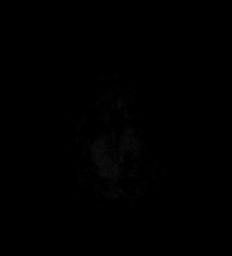

[Series 13: pha_images · axial · 3.0mm · 0.90mm/px · z∈[-142,+2]mm · 4 of 52 slices shown]
[im 1/52]
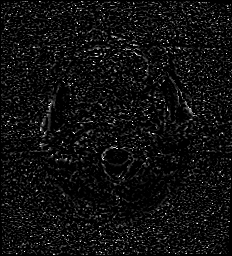
[im 18/52]
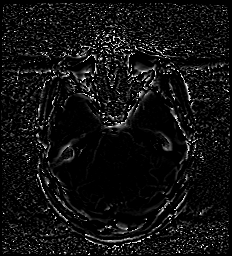
[im 35/52]
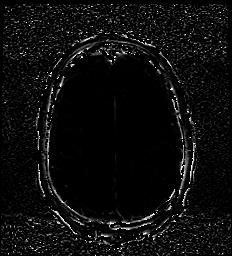
[im 52/52]
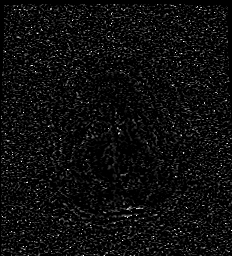

[Series 14: swi_images · axial · 3.0mm · 0.90mm/px · z∈[-142,+2]mm · 4 of 52 slices shown]
[im 1/52]
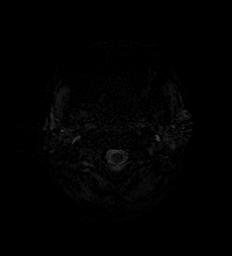
[im 18/52]
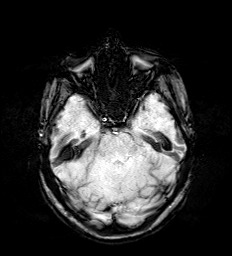
[im 35/52]
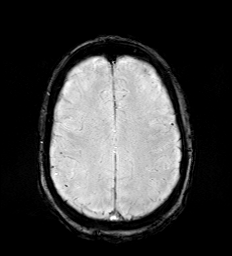
[im 52/52]
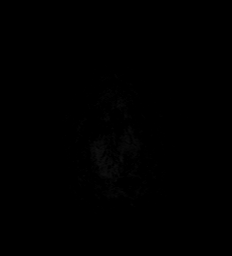

[Series 15: mip_images(sw) · axial · 24.0mm · 0.90mm/px · z∈[-132,-8]mm · 3 of 45 slices shown]
[im 1/45]
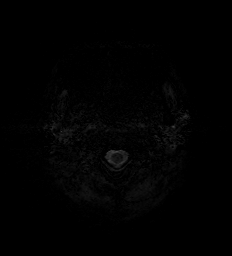
[im 23/45]
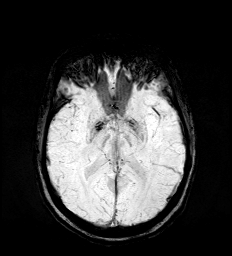
[im 45/45]
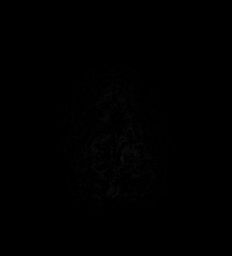

[Series 17: T2 post-contrast · coronal · 5.0mm · 0.72mm/px · 2 of 30 slices shown]
[im 1/30]
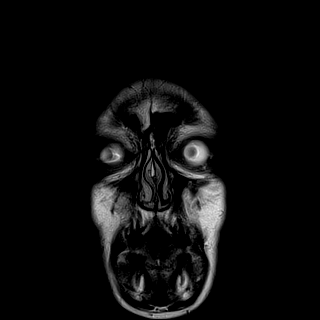
[im 30/30]
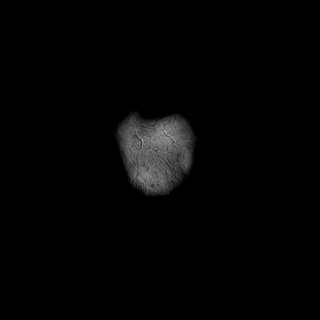

[Series 19: T1 post-contrast · coronal · 5.0mm · 0.34mm/px · 2 of 30 slices shown (1 of 2)]
[im 1/30]
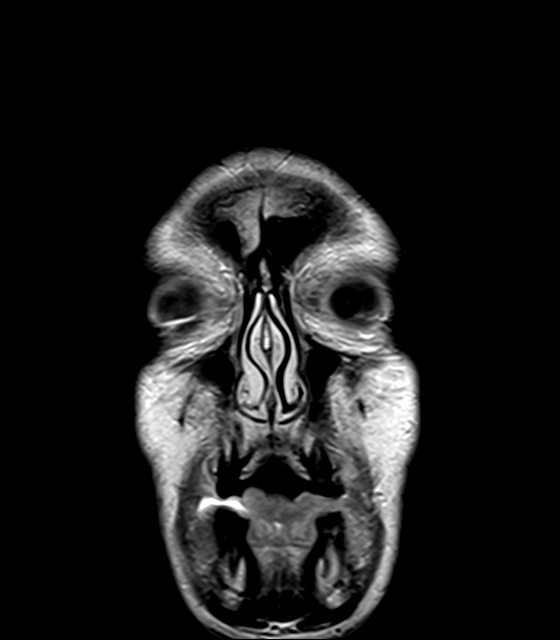
[im 30/30]
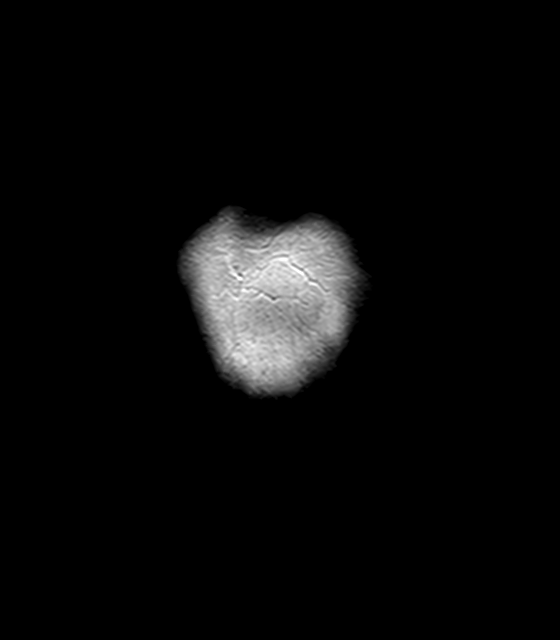

[Series 20: T1 post-contrast · sagittal · 5.0mm · 0.72mm/px · 2 of 25 slices shown (2 of 2)]
[im 1/25]
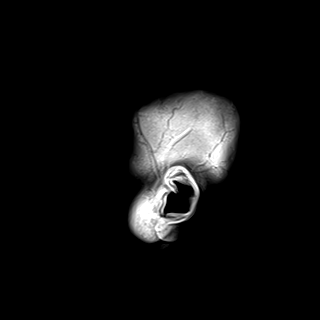
[im 25/25]
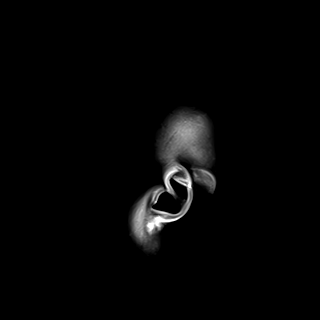

[40 of 48 positions shown; findings below may reference images not displayed]

FINDINGS: Brain: No restricted diffusion to suggest acute infarction. No
midline shift, mass effect, evidence of mass lesion,
ventriculomegaly, extra-axial collection or acute intracranial
hemorrhage. Cervicomedullary junction and pituitary are within
normal limits.

No abnormal enhancement identified.  No dural thickening.

Mild nonspecific cerebral white matter T2 and FLAIR hyperintensity
such as on series 11, image 15 in the posterior right hemisphere. No
convincing cerebral edema or cortical encephalomalacia. No definite
chronic cerebral blood products on SWI. The deep gray nuclei,
brainstem and cerebellum appear within normal limits.

Vascular: Major intracranial vascular flow voids are preserved, the
left vertebral artery appears dominant. Major dural venous sinuses
are enhancing and appear to be patent.

Skull and upper cervical spine: Visible bone marrow signal and
cervical spine within normal limits.

Sinuses/Orbits: Negative orbits. Paranasal sinuses are well
pneumatized.

Other: Mild left mastoid effusion. No complicating features. Other
visible internal auditory structures appear grossly normal.

Scalp and face soft tissues remarkable for a symmetric granular
appearance of the bilateral parotid and submandibular gland
parenchyma (series 10, image 1).
IMPRESSION: 1. No metastatic disease or acute intracranial abnormality. Minimal
to mild for age nonspecific cerebral white matter signal changes.
2. Mild left mastoid effusion, likely postinflammatory and
significance doubtful.
3. Granular appearance of the bilateral salivary glands suggesting
Sjogren's syndrome or similar chronic/recurrent inflammatory
process.

## 2022-01-23 ENCOUNTER — Inpatient Hospital Stay: Payer: Medicare Other | Attending: Hematology

## 2022-01-30 ENCOUNTER — Inpatient Hospital Stay: Payer: Medicare Other | Admitting: Hematology

## 2022-02-27 ENCOUNTER — Inpatient Hospital Stay: Payer: Medicare Other | Attending: Hematology

## 2022-02-27 DIAGNOSIS — C50912 Malignant neoplasm of unspecified site of left female breast: Secondary | ICD-10-CM | POA: Insufficient documentation

## 2022-02-27 DIAGNOSIS — Z79811 Long term (current) use of aromatase inhibitors: Secondary | ICD-10-CM | POA: Insufficient documentation

## 2022-02-27 DIAGNOSIS — Z170 Estrogen receptor positive status [ER+]: Secondary | ICD-10-CM | POA: Diagnosis not present

## 2022-02-27 DIAGNOSIS — E559 Vitamin D deficiency, unspecified: Secondary | ICD-10-CM

## 2022-02-27 LAB — COMPREHENSIVE METABOLIC PANEL
ALT: 14 U/L (ref 0–44)
AST: 24 U/L (ref 15–41)
Albumin: 3.9 g/dL (ref 3.5–5.0)
Alkaline Phosphatase: 51 U/L (ref 38–126)
Anion gap: 7 (ref 5–15)
BUN: 9 mg/dL (ref 6–20)
CO2: 26 mmol/L (ref 22–32)
Calcium: 9 mg/dL (ref 8.9–10.3)
Chloride: 104 mmol/L (ref 98–111)
Creatinine, Ser: 0.89 mg/dL (ref 0.44–1.00)
GFR, Estimated: >60 mL/min (ref >60)
Glucose, Bld: 90 mg/dL (ref 70–99)
Potassium: 3.9 mmol/L (ref 3.5–5.1)
Sodium: 137 mmol/L (ref 135–145)
Total Bilirubin: 0.5 mg/dL (ref 0.3–1.2)
Total Protein: 9.2 g/dL — ABNORMAL HIGH (ref 6.5–8.1)

## 2022-02-27 LAB — CBC WITH DIFFERENTIAL/PLATELET
Abs Immature Granulocytes: 0.01 10*3/uL (ref 0.00–0.07)
Basophils Absolute: 0 10*3/uL (ref 0.0–0.1)
Basophils Relative: 1 %
Eosinophils Absolute: 0 10*3/uL (ref 0.0–0.5)
Eosinophils Relative: 2 %
HCT: 42.2 % (ref 36.0–46.0)
Hemoglobin: 13.5 g/dL (ref 12.0–15.0)
Immature Granulocytes: 0 %
Lymphocytes Relative: 43 %
Lymphs Abs: 1.2 10*3/uL (ref 0.7–4.0)
MCH: 27.1 pg (ref 26.0–34.0)
MCHC: 32 g/dL (ref 30.0–36.0)
MCV: 84.7 fL (ref 80.0–100.0)
Monocytes Absolute: 0.3 10*3/uL (ref 0.1–1.0)
Monocytes Relative: 11 %
Neutro Abs: 1.1 10*3/uL — ABNORMAL LOW (ref 1.7–7.7)
Neutrophils Relative %: 43 %
Platelets: 204 10*3/uL (ref 150–400)
RBC: 4.98 MIL/uL (ref 3.87–5.11)
RDW: 13.2 % (ref 11.5–15.5)
WBC: 2.7 10*3/uL — ABNORMAL LOW (ref 4.0–10.5)
nRBC: 0 % (ref 0.0–0.2)

## 2022-02-27 LAB — VITAMIN D 25 HYDROXY (VIT D DEFICIENCY, FRACTURES): Vit D, 25-Hydroxy: 45.56 ng/mL (ref 30–100)

## 2022-03-01 LAB — CANCER ANTIGEN 15-3: CA 15-3: 23 U/mL (ref 0.0–25.0)

## 2022-03-06 ENCOUNTER — Inpatient Hospital Stay: Payer: Medicare Other | Attending: Hematology | Admitting: Hematology

## 2022-03-06 VITALS — BP 112/76 | HR 71 | Temp 98.0°F | Resp 16 | Ht 64.0 in | Wt 130.3 lb

## 2022-03-06 DIAGNOSIS — E559 Vitamin D deficiency, unspecified: Secondary | ICD-10-CM | POA: Diagnosis not present

## 2022-03-06 DIAGNOSIS — C50912 Malignant neoplasm of unspecified site of left female breast: Secondary | ICD-10-CM | POA: Insufficient documentation

## 2022-03-06 DIAGNOSIS — Z79811 Long term (current) use of aromatase inhibitors: Secondary | ICD-10-CM | POA: Insufficient documentation

## 2022-03-06 DIAGNOSIS — Z170 Estrogen receptor positive status [ER+]: Secondary | ICD-10-CM | POA: Diagnosis not present

## 2022-03-06 DIAGNOSIS — Z780 Asymptomatic menopausal state: Secondary | ICD-10-CM

## 2022-03-06 NOTE — Progress Notes (Signed)
[facility] [address] [place] [address]   Patient Care Team: Patient, No Pcp Per as PCP - General (General Practice) Brien Mates, RN as Oncology Nurse Navigator (Oncology)  SUMMARY OF ONCOLOGIC HISTORY: Oncology History  Breast cancer, stage 1, estrogen receptor positive, left ([place])  03/15/2020 Initial Diagnosis   Breast cancer, stage 1, estrogen receptor positive, left ([place])   03/15/2020 Cancer Staging   Staging form: Breast, AJCC 8th Edition - Clinical stage from 03/15/2020: Stage IA (cT1c, cN0, cM0, G2, ER+, PR+, HER2-) - Signed by Derek Jack, MD on 03/15/2020    Genetic Testing   Negative genetic testing on the CancerNext panel test.  The CancerNext gene panel offered by [employer] includes sequencing and rearrangement analysis for the following 34 genes:   APC, ATM, BARD1, BMPR1A, BRCA1, BRCA2, BRIP1, CDH1, CDK4, CDKN2A, CHEK2, DICER1, HOXB13, EPCAM, GREM1, MLH1, MRE11A, MSH2, MSH6, MUTYH, NBN, NF1, PALB2, PMS2, POLD1, POLE, PTEN, RAD50, RAD51C, RAD51D, SMAD4, SMARCA4, STK11, and TP53.  The report date is Sep 11, 2015.  Tested thorugh the [facility].     CHIEF COMPLIANT: Follow-up for left breast cancer   INTERVAL HISTORY: Ms. April Acevedo is a 46 y.o. female seen for follow-up of left breast cancer.  She is tolerating exemestane very well.  Energy levels are 75%.  No new onset pains reported.  REVIEW OF SYSTEMS:   Review of Systems  Constitutional:  Negative for appetite change and fatigue.  All other systems reviewed and are negative.   I have reviewed the past medical history, past surgical history, social history and family history with the patient and they are unchanged from previous note.   ALLERGIES:   has No Known Allergies.   MEDICATIONS:  Current Outpatient Medications  Medication Sig Dispense Refill   exemestane (AROMASIN) 25 MG tablet TAKE 1 TABLET(25 MG) BY MOUTH DAILY AFTER  AND BREAKFAST 30 tablet 6   ibuprofen (ADVIL) 800 MG tablet Take 1 tablet (800 mg total) by mouth every 8 (eight) hours as needed. (Patient taking differently: Take 800 mg by mouth in the morning.) 60 tablet 6   NONFORMULARY OR COMPOUNDED ITEM outpatient vestibular therapy. 1 each 0   traMADol (ULTRAM) 50 MG tablet Take 1 tablet (50 mg total) by mouth every 6 (six) hours as needed. 15 tablet 0   No current facility-administered medications for this visit.     PHYSICAL EXAMINATION: Performance status (ECOG): 0 - Asymptomatic  Vitals:   03/06/22 1541  BP: 112/76  Pulse: 71  Resp: 16  Temp: 98 F (36.7 C)  SpO2: 98%   Wt Readings from Last 3 Encounters:  03/06/22 130 lb 4.8 oz (59.1 kg)  10/01/21 123 lb (55.8 kg)  08/14/21 124 lb (56.2 kg)   Physical Exam Vitals reviewed.  Constitutional:      Appearance: Normal appearance.  Cardiovascular:     Rate and Rhythm: Normal rate and regular rhythm.     Pulses: Normal pulses.     Heart sounds: Normal heart sounds.  Pulmonary:     Effort: Pulmonary effort is normal.     Breath sounds: Normal breath sounds.  Chest:  Breasts:    Right: No swelling, bleeding, inverted nipple, mass, nipple discharge, skin change (reconstructed breat WNL) or tenderness.     Left: No swelling, bleeding, inverted nipple, mass, nipple discharge, skin change (reconstructed breast WNL) or tenderness.  Lymphadenopathy:     Upper Body:     Right  upper body: Axillary adenopathy (sub cm) present. No supraclavicular adenopathy.     Left upper body: Axillary adenopathy (sub cm) present. No supraclavicular adenopathy.  Neurological:     General: No focal deficit present.     Mental Status: She is alert and oriented to person, place, and time.  Psychiatric:        Mood and Affect: Mood normal.        Behavior: Behavior normal.     Breast Exam Chaperone: Thana Ates     LABORATORY DATA:  I have reviewed the data as listed    Latest Ref Rng & Units  02/27/2022   11:45 AM 10/01/2021    4:54 PM 07/25/2021    1:17 PM  CMP  Glucose 70 - 99 mg/dL 90  89  117   BUN 6 - 20 mg/dL _0 Creatinine 0.44 - 1.00 mg/dL 0.89  0.81  0.93   Sodium 135 - 145 mmol/L 137  136  137   Potassium 3.5 - 5.1 mmol/L 3.9  4.0  3.4   Chloride 98 - 111 mmol/L 104  102  104   CO2 22 - 32 mmol/L _1 Calcium 8.9 - 10.3 mg/dL 9.0  9.0  9.1   Total Protein 6.5 - 8.1 g/dL 9.2  8.5  9.1   Total Bilirubin 0.3 - 1.2 mg/dL 0.5  0.4  0.7   Alkaline Phos 38 - 126 U/L 51  69  62   AST 15 - 41 U/L 24  34  26   ALT 0 - 44 U/L _2 Lab Results  Component Value Date   CAN153 23.0 02/27/2022   CAN153 24.5 07/25/2021   CAN153 24.0 06/18/2020   Lab Results  Component Value Date   WBC 2.7 (L) 02/27/2022   HGB 13.5 02/27/2022   HCT 42.2 02/27/2022   MCV 84.7 02/27/2022   PLT 204 02/27/2022   NEUTROABS 1.1 (L) 02/27/2022    ASSESSMENT:  1.  T1CN0 left breast IDC, ER/PR positive, HER-2 negative: -Bilateral nipple sparing mastectomy on 10/12/[facility] [place] [facility]. -Pathology showing 1.8 cm IDC, margins negative, grade 2, ER/PR positive, HER-2 negative, PT1CPN0. -Oncotype DX recurrence score of 21 -Treated with tamoxifen for a year, Lupron was added during year 2, tamoxifen continued for a total of 3 years. -Tamoxifen changed to Aromasin, Lupron monthly continued. -She recently moved to [place].  She did not receive Lupron injection for 2 months. -Genetic testing was negative for BRCA 1 and 2 -Ultrasound on 06/09/2019 in [place] showed left axillary lymph node. -She was recently hospitalized for syncopal episode.  CT angio of the head and neck on 02/18/2020 showed 3 mm left upper lobe lung nodule. -Brain MRI on 02/19/2020 without evidence of metastatic disease. - Salpingo-oophorectomy in March 2022.   2.  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  She lives with her son. -Paternal uncle  had prostate cancer.  Paternal grandmother died of colon cancer at age 35.  Maternal cousin had ovarian cancer.   3.  Bone health: -DEXA scan on 04/26/2019 with T score of -1.6.   PLAN:  1.  T1CN0 left breast cancer: - She is tolerating Aromasin reasonably well. - Physical exam today did not reveal any palpable masses at the implant sites.  Very small stable axillary lymph nodes. - Labs reviewed by me shows elevated  total protein.  We have done SPEP in the past which was negative.  Other LFTs are normal.  CBC was grossly normal with mild leukopenia.  CA 15-3 was normal. - RTC 6 months for follow-up with repeat labs and exam.  She will take Aromasin for 10 years.   2.  Bone health: - Continue calcium and vitamin D supplements.  Vitamin D level is 45. - Will order DEXA scan prior to next visit.   3.  Left lung nodule: - CT chest without contrast on 01/14/2021: Few scattered small lung nodules throughout both lungs, largest 4 mm. - CT angio chest on 10/01/2021 without any evidence of lung nodules or masses.  No pulmonary embolus.  4.  Possible rheumatoid arthritis: - She has joint pains in the small joints of the hands.  Continue follow-up with rheumatology.  Breast Cancer therapy associated bone loss: I have recommended calcium, Vitamin D and weight bearing exercises.  Orders placed this encounter:  No orders of the defined types were placed in this encounter.   The patient has a good understanding of the overall plan. She agrees with it. She will call with any problems that may develop before the next visit here.  Derek Jack, MD [place] [phone]

## 2022-03-06 NOTE — Patient Instructions (Signed)
[facility]  Discharge Instructions  You were seen and examined today by Dr. Delton Coombes.  Dr. Delton Coombes discussed your most recent lab work which revealed that everything looks good and stable.  Follow-up as scheduled in 6 months with labs and Dexa bone scan prior.    Thank you for choosing [facility] to provide your oncology and hematology care.   To afford each patient quality time with our provider, please arrive at least 15 minutes before your scheduled appointment time. You may need to reschedule your appointment if you arrive late (10 or more minutes). Arriving late affects you and other patients whose appointments are after yours.  Also, if you miss three or more appointments without notifying the office, you may be dismissed from the clinic at the provider's discretion.    Again, thank you for choosing [facility].  Our hope is that these requests will decrease the amount of time that you wait before being seen by our physicians.   If you have a lab appointment with the [facility] please come in thru the Main Entrance and check in at the main information desk.           _____________________________________________________________  Should you have questions after your visit to [facility], please contact our office at [phone] and follow the prompts.  Our office hours are 8:00 a.m. to 4:30 p.m. Monday - Thursday and 8:00 a.m. to 2:30 p.m. Friday.  Please note that voicemails left after 4:00 p.m. may not be returned until the following business day.  We are closed weekends and all major holidays.  You do have access to a nurse 24-7, just call the main number to the clinic [phone] and do not press any options, hold on the line and a nurse will answer the phone.    For prescription refill requests, have your pharmacy contact our office and allow 72 hours.    Masks are optional in the cancer  centers. If you would like for your care team to wear a mask while they are taking care of you, please let them know. You may have one support person who is at least 47 years old accompany you for your appointments.

## 2022-05-06 ENCOUNTER — Other Ambulatory Visit: Payer: Self-pay

## 2022-05-06 ENCOUNTER — Emergency Department (HOSPITAL_COMMUNITY): Payer: Medicare Other

## 2022-05-06 ENCOUNTER — Encounter (HOSPITAL_COMMUNITY): Payer: Self-pay | Admitting: *Deleted

## 2022-05-06 ENCOUNTER — Inpatient Hospital Stay (HOSPITAL_COMMUNITY)
Admission: EM | Admit: 2022-05-06 | Discharge: 2022-05-09 | DRG: 156 | Disposition: A | Discharge status: Home / Self Care | Payer: Medicare Other | Attending: Family Medicine | Admitting: Family Medicine

## 2022-05-06 DIAGNOSIS — K1121 Acute sialoadenitis: Secondary | ICD-10-CM | POA: Diagnosis not present

## 2022-05-06 DIAGNOSIS — Z800 Family history of malignant neoplasm of digestive organs: Secondary | ICD-10-CM

## 2022-05-06 DIAGNOSIS — Z833 Family history of diabetes mellitus: Secondary | ICD-10-CM

## 2022-05-06 DIAGNOSIS — K5900 Constipation, unspecified: Secondary | ICD-10-CM | POA: Diagnosis present

## 2022-05-06 DIAGNOSIS — K1120 Sialoadenitis, unspecified: Secondary | ICD-10-CM | POA: Diagnosis present

## 2022-05-06 DIAGNOSIS — D649 Anemia, unspecified: Secondary | ICD-10-CM | POA: Diagnosis present

## 2022-05-06 DIAGNOSIS — M3500 Sicca syndrome, unspecified: Secondary | ICD-10-CM | POA: Diagnosis present

## 2022-05-06 DIAGNOSIS — Z820 Family history of epilepsy and other diseases of the nervous system: Secondary | ICD-10-CM

## 2022-05-06 DIAGNOSIS — J020 Streptococcal pharyngitis: Secondary | ICD-10-CM | POA: Diagnosis present

## 2022-05-06 DIAGNOSIS — Z79899 Other long term (current) drug therapy: Secondary | ICD-10-CM

## 2022-05-06 DIAGNOSIS — Z853 Personal history of malignant neoplasm of breast: Secondary | ICD-10-CM

## 2022-05-06 DIAGNOSIS — Z8041 Family history of malignant neoplasm of ovary: Secondary | ICD-10-CM

## 2022-05-06 DIAGNOSIS — M069 Rheumatoid arthritis, unspecified: Secondary | ICD-10-CM | POA: Insufficient documentation

## 2022-05-06 DIAGNOSIS — Z1152 Encounter for screening for COVID-19: Secondary | ICD-10-CM

## 2022-05-06 DIAGNOSIS — B950 Streptococcus, group A, as the cause of diseases classified elsewhere: Secondary | ICD-10-CM | POA: Insufficient documentation

## 2022-05-06 LAB — CBC WITH DIFFERENTIAL/PLATELET
Abs Immature Granulocytes: 0.06 10*3/uL (ref 0.00–0.07)
Basophils Absolute: 0 10*3/uL (ref 0.0–0.1)
Basophils Relative: 0 %
Eosinophils Absolute: 0 10*3/uL (ref 0.0–0.5)
Eosinophils Relative: 0 %
HCT: 36.6 % (ref 36.0–46.0)
Hemoglobin: 11.6 g/dL — ABNORMAL LOW (ref 12.0–15.0)
Immature Granulocytes: 1 %
Lymphocytes Relative: 13 %
Lymphs Abs: 1.3 10*3/uL (ref 0.7–4.0)
MCH: 26.7 pg (ref 26.0–34.0)
MCHC: 31.7 g/dL (ref 30.0–36.0)
MCV: 84.1 fL (ref 80.0–100.0)
Monocytes Absolute: 0.6 10*3/uL (ref 0.1–1.0)
Monocytes Relative: 6 %
Neutro Abs: 8.6 10*3/uL — ABNORMAL HIGH (ref 1.7–7.7)
Neutrophils Relative %: 80 %
Platelets: 296 10*3/uL (ref 150–400)
RBC: 4.35 MIL/uL (ref 3.87–5.11)
RDW: 12.6 % (ref 11.5–15.5)
WBC: 10.7 10*3/uL — ABNORMAL HIGH (ref 4.0–10.5)
nRBC: 0 % (ref 0.0–0.2)

## 2022-05-06 LAB — URINALYSIS, ROUTINE W REFLEX MICROSCOPIC
Bilirubin Urine: NEGATIVE
Glucose, UA: NEGATIVE mg/dL
Hgb urine dipstick: NEGATIVE
Ketones, ur: NEGATIVE mg/dL
Leukocytes,Ua: NEGATIVE
Nitrite: NEGATIVE
Protein, ur: 30 mg/dL — AB
Specific Gravity, Urine: 1.021 (ref 1.005–1.030)
pH: 6 (ref 5.0–8.0)

## 2022-05-06 LAB — BASIC METABOLIC PANEL
Anion gap: 8 (ref 5–15)
BUN: 11 mg/dL (ref 6–20)
CO2: 23 mmol/L (ref 22–32)
Calcium: 8.7 mg/dL — ABNORMAL LOW (ref 8.9–10.3)
Chloride: 104 mmol/L (ref 98–111)
Creatinine, Ser: 0.77 mg/dL (ref 0.44–1.00)
GFR, Estimated: >60 mL/min (ref >60)
Glucose, Bld: 89 mg/dL (ref 70–99)
Potassium: 3.5 mmol/L (ref 3.5–5.1)
Sodium: 135 mmol/L (ref 135–145)

## 2022-05-06 LAB — RESP PANEL BY RT-PCR (RSV, FLU A&B, COVID)  RVPGX2
Influenza A by PCR: NEGATIVE
Influenza B by PCR: NEGATIVE
Resp Syncytial Virus by PCR: NEGATIVE
SARS Coronavirus 2 by RT PCR: NEGATIVE

## 2022-05-06 LAB — PREGNANCY, URINE: Preg Test, Ur: NEGATIVE

## 2022-05-06 LAB — GROUP A STREP BY PCR: Group A Strep by PCR: DETECTED — AB

## 2022-05-06 MED ORDER — KETOROLAC TROMETHAMINE 30 MG/ML IJ SOLN
15.0000 mg | Freq: Once | INTRAMUSCULAR | Status: AC
  Administered 2022-05-06: 15 mg via INTRAVENOUS
  Filled 2022-05-06: qty 1

## 2022-05-06 MED ORDER — HYDROCODONE-ACETAMINOPHEN 5-325 MG PO TABS: 1.0000 | ORAL_TABLET | ORAL | 0 refills | Status: AC | PRN

## 2022-05-06 MED ORDER — IBUPROFEN 600 MG PO TABS: 600.0000 mg | ORAL_TABLET | Freq: Three times a day (TID) | ORAL | 0 refills | Status: DC

## 2022-05-06 MED ORDER — HYDROCODONE-ACETAMINOPHEN 5-325 MG PO TABS
1.0000 | ORAL_TABLET | Freq: Once | ORAL | Status: AC
  Administered 2022-05-06: 1 via ORAL
  Filled 2022-05-06: qty 1

## 2022-05-06 MED ORDER — IOHEXOL 300 MG/ML  SOLN
75.0000 mL | Freq: Once | INTRAMUSCULAR | Status: AC | PRN
  Administered 2022-05-06: 75 mL via INTRAVENOUS

## 2022-05-06 MED ORDER — PENICILLIN G BENZATHINE 1200000 UNIT/2ML IM SUSY
1.2000 10*6.[IU] | PREFILLED_SYRINGE | Freq: Once | INTRAMUSCULAR | Status: AC
  Administered 2022-05-06: 1.2 10*6.[IU] via INTRAMUSCULAR
  Filled 2022-05-06: qty 2

## 2022-05-06 MED ORDER — DEXAMETHASONE SODIUM PHOSPHATE 10 MG/ML IJ SOLN
10.0000 mg | Freq: Once | INTRAMUSCULAR | Status: AC
  Administered 2022-05-07: 10 mg via INTRAVENOUS
  Filled 2022-05-06: qty 1

## 2022-05-06 MED ORDER — SODIUM CHLORIDE 0.9 % IV BOLUS
1000.0000 mL | Freq: Once | INTRAVENOUS | Status: AC
  Administered 2022-05-06: 1000 mL via INTRAVENOUS

## 2022-05-06 NOTE — ED Triage Notes (Signed)
Pt states she has had blood in her urine when these sx started

## 2022-05-06 NOTE — ED Provider Triage Note (Signed)
Emergency Medicine Provider Triage Evaluation Note  April Acevedo , a 47 y.o. female  was evaluated in triage.  Pt complains of nasal congestion, cough, body aches, mouth and throat pain, ear pain and urinary symptoms.  Symptoms began 1 week ago.  Subjective fever at home.  Symptoms have also been associated with crusting and drainage to both eyes.  She reports having some pressure discomfort to her lower abdomen as well.  No vomiting or diarrhea.  Review of Systems  Positive: Denies body aches, nasal congestion, cough, mouth and throat pain and urinary symptoms Negative: Vomiting, diarrhea, chest pain shortness of breath  Physical Exam  BP 104/78 (BP Location: Right Arm)   Pulse 94   Temp 99.1 F (37.3 C) (Oral)   Resp 20   Ht '5\' 4"'$  (1.626 m)   Wt 58.5 kg   SpO2 100%   BMI 22.14 kg/m  Gen:   Awake, no distress   Resp:  Normal effort actively coughing MSK:   Moves extremities without difficulty oratory Other:  Body aches, erythema of the oropharynx and hard palate  Medical Decision Making  Medically screening exam initiated at 6:20 PM.  Appropriate orders placed.  April Acevedo was informed that the remainder of the evaluation will be completed by another provider, this initial triage assessment does not replace that evaluation, and the importance of remaining in the ED until their evaluation is complete.     Kem Parkinson, PA-C 05/06/22 1826

## 2022-05-06 NOTE — ED Provider Notes (Signed)
[facility] EMERGENCY DEPARTMENT Provider Note   CSN: [id number] Arrival date & time: 05/06/22  1329     History  Chief Complaint  Patient presents with   Fever    April Acevedo is a 47 y.o. female with a history significant for atrial fibrillation, treated breast cancer but still under care of Dr. Delton Coombes for surveillance on Aromasin presenting for evaluation of generalized myalgias, subjective fever, cold-like symptoms including nasal congestion, rhinorrhea, nonproductive cough.  Additionally she developed a sore throat over the past several days and has developed significant swelling of her lymph nodes in her neck.  She states she also has seen episodes of stranding blood in her urine when the symptoms began.  She denies dysuria or increased urinary frequency, also denies nausea or vomiting, abdominal pain, chest pain or shortness of breath.  She is having difficulty swallowing secondary to throat pain.  She has found no alleviators for symptoms.  Of note she had a tonsillectomy as a child and had surgical removal of a salivary stone last year.  She is fully vaccinated.  The history is provided by the patient.       Home Medications Prior to Admission medications   Medication Sig Start Date End Date Taking? Authorizing Provider  HYDROcodone-acetaminophen (NORCO/VICODIN) 5-325 MG tablet Take 1 tablet by mouth every 4 (four) hours as needed for severe pain. 05/06/22  Yes Safiyya Stokes, Almyra Free, PA-C  ibuprofen (ADVIL) 600 MG tablet Take 1 tablet (600 mg total) by mouth 3 (three) times daily. 05/06/22  Yes Takeria Marquina, Almyra Free, PA-C  exemestane (AROMASIN) 25 MG tablet TAKE 1 TABLET(25 MG) BY MOUTH DAILY AFTER AND BREAKFAST 01/23/21   Derek Jack, MD  NONFORMULARY OR COMPOUNDED ITEM outpatient vestibular therapy. 02/26/20   Thurnell Lose, MD  traMADol (ULTRAM) 50 MG tablet Take 1 tablet (50 mg total) by mouth every 6 (six) hours as needed. 10/01/21   Margarita Mail, PA-C      Allergies     Patient has no known allergies.    Review of Systems   Review of Systems  Constitutional:  Positive for chills, fatigue and fever.  HENT:  Positive for congestion, facial swelling, sore throat and trouble swallowing.   Eyes: Negative.   Respiratory:  Negative for chest tightness and shortness of breath.   Cardiovascular:  Negative for chest pain.  Gastrointestinal:  Negative for abdominal pain, nausea and vomiting.  Genitourinary:  Positive for hematuria.  Musculoskeletal:  Positive for myalgias and neck pain. Negative for arthralgias and joint swelling.  Skin: Negative.  Negative for rash and wound.  Neurological:  Negative for dizziness, weakness, light-headedness, numbness and headaches.  Psychiatric/Behavioral: Negative.      Physical Exam Updated Vital Signs BP 104/78 (BP Location: Right Arm)   Pulse 94   Temp 99 F (37.2 C) (Oral)   Resp 20   Ht '5\' 4"'$  (1.626 m)   Wt 58.5 kg   SpO2 100%   BMI 22.14 kg/m  Physical Exam Constitutional:      Appearance: She is well-developed.  HENT:     Head: Normocephalic and atraumatic.     Right Ear: Tympanic membrane and ear canal normal.     Left Ear: Tympanic membrane and ear canal normal.     Nose: Rhinorrhea present. No mucosal edema.     Mouth/Throat:     Mouth: Mucous membranes are dry.     Pharynx: Oropharynx is clear. Uvula midline. Posterior oropharyngeal erythema present. No oropharyngeal exudate.  Tonsils: No tonsillar abscesses.     Comments: Tonsils absent.  Tender to palpation sublingual, but soft. Can extend jaw but not fully secondary to pain.  Eyes:     Conjunctiva/sclera: Conjunctivae normal.  Cardiovascular:     Rate and Rhythm: Normal rate.     Heart sounds: Normal heart sounds.  Pulmonary:     Effort: Pulmonary effort is normal. No respiratory distress.     Breath sounds: No wheezing or rales.  Abdominal:     Palpations: Abdomen is soft.     Tenderness: There is no abdominal tenderness.   Musculoskeletal:        General: Normal range of motion.  Lymphadenopathy:     Head:     Right side of head: Tonsillar and preauricular adenopathy present.     Left side of head: Tonsillar adenopathy present.  Skin:    General: Skin is warm and dry.     Findings: No rash.  Neurological:     Mental Status: She is alert and oriented to person, place, and time.     ED Results / Procedures / Treatments   Labs (all labs ordered are listed, but only abnormal results are displayed) Labs Reviewed  GROUP A STREP BY PCR - Abnormal; Notable for the following components:      Result Value   Group A Strep by PCR DETECTED (*)    All other components within normal limits  URINALYSIS, ROUTINE W REFLEX MICROSCOPIC - Abnormal; Notable for the following components:   Protein, ur 30 (*)    Bacteria, UA RARE (*)    All other components within normal limits  BASIC METABOLIC PANEL - Abnormal; Notable for the following components:   Calcium 8.7 (*)    All other components within normal limits  CBC WITH DIFFERENTIAL/PLATELET - Abnormal; Notable for the following components:   WBC 10.7 (*)    Hemoglobin 11.6 (*)    Neutro Abs 8.6 (*)    All other components within normal limits  RESP PANEL BY RT-PCR (RSV, FLU A&B, COVID)  RVPGX2  PREGNANCY, URINE    EKG None  Radiology No results found.  Procedures Procedures    Medications Ordered in ED Medications  dexamethasone (DECADRON) injection 10 mg (has no administration in time range)  penicillin g benzathine (BICILLIN LA) 1200000 UNIT/2ML injection 1.2 Million Units (1.2 Million Units Intramuscular Given 05/06/22 2145)  sodium chloride 0.9 % bolus 1,000 mL (0 mLs Intravenous Stopped 05/06/22 2315)  HYDROcodone-acetaminophen (NORCO/VICODIN) 5-325 MG per tablet 1 tablet (1 tablet Oral Given 05/06/22 2145)  ketorolac (TORADOL) 30 MG/ML injection 15 mg (15 mg Intravenous Given 05/06/22 2314)    ED Course/ Medical Decision Making/ A&P                            Medical Decision Making Patient presenting with URI type symptoms which has evolved into primarily a significant sore throat with fever and worsening tonsillar and parotid adenopathy.  She is strep positive today.  She is opted for an IM injection of Bicillin which was given.  She was given IV fluids here after which we attempted p.o. intake.  However she was having increased pain and had great difficulty trying to sip fluids.  She will be given additional IV fluids and given degree of adenopathy will plan CT imaging to assess for deeper infection or other source of adenopathy.  Patient care assumed by Dr. Sedonia Small.  Amount and/or Complexity  of Data Reviewed Labs: ordered.    Details: Significant for WBC count of 10.7, she has normal electrolytes, her urinalysis is negative for hemoglobin.  She is strep positive, her respiratory panel is negative. Radiology: ordered.    Details: Ct soft tissue neck  Risk Prescription drug management.           Final Clinical Impression(s) / ED Diagnoses Final diagnoses:  Strep throat  Parotitis, acute    Rx / DC Orders ED Discharge Orders          Ordered    ibuprofen (ADVIL) 600 MG tablet  3 times daily        05/06/22 2305    HYDROcodone-acetaminophen (NORCO/VICODIN) 5-325 MG tablet  Every 4 hours PRN        05/06/22 2305              Evalee Jefferson, PA-C 05/07/22 0026    Hayden Rasmussen, MD 05/07/22 1151

## 2022-05-06 NOTE — Discharge Instructions (Addendum)
Rest make sure you are drinking plenty of fluids.  You are being prescribed pain medication to help you with your throat pain as your symptoms continue to heal.  This should also help reduce the swelling of your lymph nodes.  Plan to see your doctor for recheck if your symptoms are not starting to improve over the next 24 to 48 hours.

## 2022-05-06 NOTE — ED Provider Notes (Signed)
  Provider Note MRN:  [id number]  Arrival date & time: 05/07/22    ED Course and Medical Decision Making  Assumed care from PA [place] Idol at shift change.  Question infectious proctitis awaiting CT.  CT reveals bilateral edematous parotid glands, possibly underlying Sjogren's disease.  Clinically patient still feels very unwell, oral temp 99.3, soft blood pressure, very tight soft tissues to the right side of the face surrounding this parotid gland.  Concern for acute bacterial proctitis, concern for facial cellulitis, concern for possibly early sepsis.  Reportedly patient had a fever up to 104 at home yesterday.  Given the amount of facial swelling and all of these concerns, will request hospitalist admission.  .Critical Care  Performed by: Maudie Flakes, MD Authorized by: Maudie Flakes, MD   Critical care provider statement:    Critical care time (minutes):  35   Critical care was necessary to treat or prevent imminent or life-threatening deterioration of the following conditions:  Sepsis   Critical care was time spent personally by me on the following activities:  Development of treatment plan with patient or surrogate, discussions with consultants, evaluation of patient's response to treatment, examination of patient, ordering and review of laboratory studies, ordering and review of radiographic studies, ordering and performing treatments and interventions, pulse oximetry, re-evaluation of patient's condition and review of old charts   Final Clinical Impressions(s) / ED Diagnoses     ICD-10-CM   1. Strep throat  J02.0     2. Parotitis, acute  K11.21         Barth Kirks. Sedonia Small, [place] [email or web address]    Maudie Flakes, MD 05/07/22 [phone]

## 2022-05-06 NOTE — ED Provider Notes (Incomplete)
[facility] EMERGENCY DEPARTMENT Provider Note   CSN: [id number] Arrival date & time: 05/06/22  1329     History {Add pertinent medical, surgical, social history, OB history to HPI:1} Chief Complaint  Patient presents with  . Fever    April Acevedo is a 47 y.o. female with a history significant for atrial fibrillation, treated breast cancer but still under care of Dr. Delton Coombes for surveillance on Aromasin presenting for evaluation of generalized myalgias, subjective fever, cold-like symptoms including nasal congestion, rhinorrhea, nonproductive cough.  Additionally she developed a sore throat over the past several days and has developed significant swelling of her lymph nodes in her neck.  She states she also has seen episodes of stranding blood in her urine when the symptoms began.  She denies dysuria or increased urinary frequency, also denies nausea or vomiting, abdominal pain, chest pain or shortness of breath.  She is having difficulty swallowing secondary to throat pain.  She has found no alleviators for symptoms.  Of note she had a tonsillectomy as a child and had surgical removal of a salivary stone last year.  She is fully vaccinated.  The history is provided by the patient.       Home Medications Prior to Admission medications   Medication Sig Start Date End Date Taking? Authorizing Provider  HYDROcodone-acetaminophen (NORCO/VICODIN) 5-325 MG tablet Take 1 tablet by mouth every 4 (four) hours as needed for severe pain. 05/06/22  Yes Tonnya Garbett, Almyra Free, PA-C  ibuprofen (ADVIL) 600 MG tablet Take 1 tablet (600 mg total) by mouth 3 (three) times daily. 05/06/22  Yes Bri Wakeman, Almyra Free, PA-C  exemestane (AROMASIN) 25 MG tablet TAKE 1 TABLET(25 MG) BY MOUTH DAILY AFTER AND BREAKFAST 01/23/21   Derek Jack, MD  NONFORMULARY OR COMPOUNDED ITEM outpatient vestibular therapy. 02/26/20   Thurnell Lose, MD  traMADol (ULTRAM) 50 MG tablet Take 1 tablet (50 mg total) by mouth every 6 (six)  hours as needed. 10/01/21   Margarita Mail, PA-C      Allergies    Patient has no known allergies.    Review of Systems   Review of Systems  Physical Exam Updated Vital Signs BP 104/78 (BP Location: Right Arm)   Pulse 94   Temp 99 F (37.2 C) (Oral)   Resp 20   Ht '5\' 4"'$  (1.626 m)   Wt 58.5 kg   SpO2 100%   BMI 22.14 kg/m  Physical Exam  ED Results / Procedures / Treatments   Labs (all labs ordered are listed, but only abnormal results are displayed) Labs Reviewed  GROUP A STREP BY PCR - Abnormal; Notable for the following components:      Result Value   Group A Strep by PCR DETECTED (*)    All other components within normal limits  URINALYSIS, ROUTINE W REFLEX MICROSCOPIC - Abnormal; Notable for the following components:   Protein, ur 30 (*)    Bacteria, UA RARE (*)    All other components within normal limits  BASIC METABOLIC PANEL - Abnormal; Notable for the following components:   Calcium 8.7 (*)    All other components within normal limits  CBC WITH DIFFERENTIAL/PLATELET - Abnormal; Notable for the following components:   WBC 10.7 (*)    Hemoglobin 11.6 (*)    Neutro Abs 8.6 (*)    All other components within normal limits  RESP PANEL BY RT-PCR (RSV, FLU A&B, COVID)  RVPGX2  PREGNANCY, URINE    EKG None  Radiology No results found.  Procedures Procedures  {Document cardiac monitor, telemetry assessment procedure when appropriate:1}  Medications Ordered in ED Medications  dexamethasone (DECADRON) injection 10 mg (has no administration in time range)  penicillin g benzathine (BICILLIN LA) 1200000 UNIT/2ML injection 1.2 Million Units (1.2 Million Units Intramuscular Given 05/06/22 2145)  sodium chloride 0.9 % bolus 1,000 mL (0 mLs Intravenous Stopped 05/06/22 2315)  HYDROcodone-acetaminophen (NORCO/VICODIN) 5-325 MG per tablet 1 tablet (1 tablet Oral Given 05/06/22 2145)  ketorolac (TORADOL) 30 MG/ML injection 15 mg (15 mg Intravenous Given 05/06/22 2314)     ED Course/ Medical Decision Making/ A&P                           Medical Decision Making Amount and/or Complexity of Data Reviewed Labs: ordered.  Risk Prescription drug management.   ***  {Document critical care time when appropriate:1} {Document review of labs and clinical decision tools ie heart score, Chads2Vasc2 etc:1}  {Document your independent review of radiology images, and any outside records:1} {Document your discussion with family members, caretakers, and with consultants:1} {Document social determinants of health affecting pt's care:1} {Document your decision making why or why not admission, treatments were needed:1} Final Clinical Impression(s) / ED Diagnoses Final diagnoses:  Strep throat  Parotitis, acute    Rx / DC Orders ED Discharge Orders          Ordered    ibuprofen (ADVIL) 600 MG tablet  3 times daily        05/06/22 2305    HYDROcodone-acetaminophen (NORCO/VICODIN) 5-325 MG tablet  Every 4 hours PRN        05/06/22 2305

## 2022-05-06 NOTE — ED Provider Notes (Incomplete)
  Provider Note MRN:  [id number]  [place] date & time: 05/06/22    ED Course and Medical Decision Making  Assumed care from PA [place] Idol at shift change.  Question infectious proctitis awaiting CT.  Procedures  Final Clinical Impressions(s) / ED Diagnoses     ICD-10-CM   1. Strep throat  J02.0     2. Parotitis, acute  K11.21       ED Discharge Orders          Ordered    ibuprofen (ADVIL) 600 MG tablet  3 times daily        05/06/22 2305    HYDROcodone-acetaminophen (NORCO/VICODIN) 5-325 MG tablet  Every 4 hours PRN        05/06/22 2305              Discharge Instructions      Rest make sure you are drinking plenty of fluids.  You are being prescribed pain medication to help you with your throat pain as your symptoms continue to heal.  This should also help reduce the swelling of your lymph nodes.  Plan to see your doctor for recheck if your symptoms are not starting to improve over the next 24 to 48 hours.    Barth Kirks. Sedonia Small, [place] [email or web address]

## 2022-05-06 NOTE — ED Triage Notes (Signed)
Pt c/o fevers for over a week with cold symptoms

## 2022-05-07 DIAGNOSIS — Z800 Family history of malignant neoplasm of digestive organs: Secondary | ICD-10-CM | POA: Diagnosis not present

## 2022-05-07 DIAGNOSIS — D649 Anemia, unspecified: Secondary | ICD-10-CM | POA: Diagnosis present

## 2022-05-07 DIAGNOSIS — B950 Streptococcus, group A, as the cause of diseases classified elsewhere: Secondary | ICD-10-CM | POA: Insufficient documentation

## 2022-05-07 DIAGNOSIS — Z853 Personal history of malignant neoplasm of breast: Secondary | ICD-10-CM | POA: Diagnosis not present

## 2022-05-07 DIAGNOSIS — Z833 Family history of diabetes mellitus: Secondary | ICD-10-CM | POA: Diagnosis not present

## 2022-05-07 DIAGNOSIS — Z820 Family history of epilepsy and other diseases of the nervous system: Secondary | ICD-10-CM | POA: Diagnosis not present

## 2022-05-07 DIAGNOSIS — J020 Streptococcal pharyngitis: Secondary | ICD-10-CM

## 2022-05-07 DIAGNOSIS — K5900 Constipation, unspecified: Secondary | ICD-10-CM | POA: Diagnosis present

## 2022-05-07 DIAGNOSIS — K1120 Sialoadenitis, unspecified: Secondary | ICD-10-CM | POA: Diagnosis not present

## 2022-05-07 DIAGNOSIS — M3500 Sicca syndrome, unspecified: Secondary | ICD-10-CM | POA: Diagnosis present

## 2022-05-07 DIAGNOSIS — Z79899 Other long term (current) drug therapy: Secondary | ICD-10-CM | POA: Diagnosis not present

## 2022-05-07 DIAGNOSIS — M069 Rheumatoid arthritis, unspecified: Secondary | ICD-10-CM | POA: Insufficient documentation

## 2022-05-07 DIAGNOSIS — K1121 Acute sialoadenitis: Secondary | ICD-10-CM | POA: Diagnosis present

## 2022-05-07 DIAGNOSIS — Z8739 Personal history of other diseases of the musculoskeletal system and connective tissue: Secondary | ICD-10-CM

## 2022-05-07 DIAGNOSIS — Z8041 Family history of malignant neoplasm of ovary: Secondary | ICD-10-CM | POA: Diagnosis not present

## 2022-05-07 DIAGNOSIS — Z1152 Encounter for screening for COVID-19: Secondary | ICD-10-CM | POA: Diagnosis not present

## 2022-05-07 LAB — HIV ANTIBODY (ROUTINE TESTING W REFLEX): HIV Screen 4th Generation wRfx: NONREACTIVE

## 2022-05-07 LAB — LACTIC ACID, PLASMA: Lactic Acid, Venous: 0.8 mmol/L (ref 0.5–1.9)

## 2022-05-07 MED ORDER — SODIUM CHLORIDE 0.9 % IV BOLUS
1000.0000 mL | Freq: Once | INTRAVENOUS | Status: AC
  Administered 2022-05-07: 1000 mL via INTRAVENOUS

## 2022-05-07 MED ORDER — ONDANSETRON HCL 4 MG PO TABS: 4.0000 mg | ORAL_TABLET | Freq: Four times a day (QID) | ORAL | Status: DC | PRN

## 2022-05-07 MED ORDER — ONDANSETRON HCL 4 MG/2ML IJ SOLN: 4.0000 mg | Freq: Four times a day (QID) | INTRAMUSCULAR | Status: DC | PRN

## 2022-05-07 MED ORDER — LACTATED RINGERS IV BOLUS
1000.0000 mL | Freq: Once | INTRAVENOUS | Status: AC
  Administered 2022-05-07: 1000 mL via INTRAVENOUS

## 2022-05-07 MED ORDER — DIPHENHYDRAMINE HCL 50 MG/ML IJ SOLN
25.0000 mg | Freq: Once | INTRAMUSCULAR | Status: AC
  Administered 2022-05-07: 25 mg via INTRAVENOUS
  Filled 2022-05-07: qty 1

## 2022-05-07 MED ORDER — FENTANYL CITRATE PF 50 MCG/ML IJ SOSY
50.0000 ug | PREFILLED_SYRINGE | Freq: Once | INTRAMUSCULAR | Status: AC
  Administered 2022-05-07: 50 ug via INTRAVENOUS
  Filled 2022-05-07: qty 1

## 2022-05-07 MED ORDER — LACTATED RINGERS IV SOLN: INTRAVENOUS | Status: DC

## 2022-05-07 MED ORDER — ACETAMINOPHEN 325 MG PO TABS
650.0000 mg | ORAL_TABLET | Freq: Four times a day (QID) | ORAL | Status: DC | PRN
  Administered 2022-05-07 – 2022-05-09 (×6): 650 mg via ORAL
  Filled 2022-05-07 (×6): qty 2

## 2022-05-07 MED ORDER — ENOXAPARIN SODIUM 40 MG/0.4ML IJ SOSY
40.0000 mg | PREFILLED_SYRINGE | INTRAMUSCULAR | Status: DC
  Administered 2022-05-07 – 2022-05-09 (×3): 40 mg via SUBCUTANEOUS
  Filled 2022-05-07 (×3): qty 0.4

## 2022-05-07 MED ORDER — ONDANSETRON HCL 4 MG/2ML IJ SOLN
4.0000 mg | Freq: Once | INTRAMUSCULAR | Status: AC
  Administered 2022-05-07: 4 mg via INTRAVENOUS
  Filled 2022-05-07: qty 2

## 2022-05-07 MED ORDER — SODIUM CHLORIDE 0.9 % IV SOLN
3.0000 g | Freq: Four times a day (QID) | INTRAVENOUS | Status: DC
  Administered 2022-05-07 – 2022-05-09 (×9): 3 g via INTRAVENOUS
  Filled 2022-05-07 (×9): qty 8

## 2022-05-07 MED ORDER — SODIUM CHLORIDE 0.9 % IV SOLN
3.0000 g | Freq: Once | INTRAVENOUS | Status: AC
  Administered 2022-05-07: 3 g via INTRAVENOUS
  Filled 2022-05-07: qty 8

## 2022-05-07 MED ORDER — ACETAMINOPHEN 650 MG RE SUPP: 650.0000 mg | Freq: Four times a day (QID) | RECTAL | Status: DC | PRN

## 2022-05-07 MED ORDER — KETOROLAC TROMETHAMINE 15 MG/ML IJ SOLN
15.0000 mg | Freq: Four times a day (QID) | INTRAMUSCULAR | Status: DC | PRN
  Administered 2022-05-07 – 2022-05-08 (×4): 15 mg via INTRAVENOUS
  Filled 2022-05-07 (×5): qty 1

## 2022-05-07 MED ORDER — PROCHLORPERAZINE EDISYLATE 10 MG/2ML IJ SOLN
10.0000 mg | Freq: Once | INTRAMUSCULAR | Status: AC
  Administered 2022-05-07: 10 mg via INTRAVENOUS
  Filled 2022-05-07: qty 2

## 2022-05-07 MED ORDER — DEXAMETHASONE SODIUM PHOSPHATE 10 MG/ML IJ SOLN
6.0000 mg | INTRAMUSCULAR | Status: DC
  Administered 2022-05-07 – 2022-05-09 (×3): 6 mg via INTRAVENOUS
  Filled 2022-05-07 (×3): qty 1

## 2022-05-07 MED FILL — Hydrocodone-Acetaminophen Tab 5-325 MG: ORAL | Qty: 6 | Status: AC

## 2022-05-07 NOTE — Progress Notes (Signed)
PROGRESS NOTE    Patient: April Acevedo                            PCP: Patient, No Pcp Per                    DOB: 01-Aug-1975            DOA: 05/06/2022 HEN:[id number]             DOS: 05/07/2022, 11:40 AM   LOS: 0 days   Date of Service: The patient was seen and examined on 05/07/2022  Subjective:   The patient was seen and examined this morning. Hemodynamically stable, still complaining of bilateral facial, parotid gland swelling, tenderness Unable to open her mouth fully  Brief Narrative:   April Acevedo is a 47 y.o. female with medical history significant of rheumatoid arthritis, breast cancer being followed by Dr. Delton Coombes and surgical history of tonsillectomy as a child and surgical removal of his salivary stone last year who presents emergency department due to several days of onset of subjective fever, cold-like symptoms, myalgia, nasal congestion and nonproductive cough.  She also complained of several days of sore throat with significant swelling of bilateral parotid glands (R > L).  She endorsed fever of 104F at home, she complained of increased swallowing difficulty due to sore throat.   ED Course:  In the emergency department, BP was soft at 95/61, but other vital signs are within normal range.  Workup in the ED showed normal CBC except for normocytic anemia and WBC of 10.7.  BMP was normal, urinalysis was unimpressive for UTI, pregnancy test was negative and lactic acid was 0.8.  Group A streptococcal by PCR was detected, blood culture was negative, influenza A, B, SARS coronavirus 2, RSV was negative. CT neck with contrast showed both parotid glands are enlarged and atherogenesis with numerous calcifications.  Atrophic appearance of the submandibular glands findings are consistent with chronic inflammatory disease of the salivary glands, such as Sjogren's disease.  Incidental heterogeneous and enlarged thyroid was noted. Patient was treated with IV Unasyn, Decadron,  Benadryl, Norco, Toradol, Zofran, Compazine and benzathine penicillin were given. Hospitalist was asked to admit patient for further evaluation and management.  Assessment & Plan:   Principal Problem:   Parotitis Active Problems:   Group A streptococcal infection   History of breast cancer   Rheumatoid arthritis ([place])     Bilateral Parotitis -Still complaining of bilateral parotid gland tenderness swelling Patient was started with Unasyn, we shall continue with same at this time Continue IV Decadron 6 mg daily Continue IV Toradol 15 mg every 6 hours as needed and transition to oral NSAIDs when patient sore throat and painful swallowing improves -Encouraged soft diet -Encouraging lemon, lime orally to stimulate gland secretion   Strep throat Group A strep by PCR was detected Benzathine Penicillin x 1 was already given   Rheumatoid arthritis Continue Toradol   History of breast cancer Stable, follows with Dr. Delton Coombes  ------------------------------------------------------------------------------------------------------------------------------------- Nutritional status:  The patient's BMI is: Body mass index is 22.14 kg/m. I agree with the assessment and plan as outlined ---------------------------------------------------------------------------------------------------------------------------------  DVT prophylaxis:  enoxaparin (LOVENOX) injection 40 mg Start: 05/07/22 1000 SCDs Start: 05/07/22 0802   Code Status:   Code Status: Full Code  Family Communication: No family member present at bedside- attempt will be made to update daily The above findings and plan of care has been discussed  with patient (and family)  in detail,  they expressed understanding and agreement of above. -Advance care planning has been discussed.   Admission status:   Status is: Inpatient Remains inpatient appropriate because: IV antibiotics, IV fluids     Procedures:   No admission  procedures for hospital encounter.   Antimicrobials:  Anti-infectives (From admission, onward)    Start     Dose/Rate Route Frequency Ordered Stop   05/07/22 0900  Ampicillin-Sulbactam (UNASYN) 3 g in sodium chloride 0.9 % 100 mL IVPB        3 g 200 mL/hr over 30 Minutes Intravenous Every 6 hours 05/07/22 0757     05/07/22 0100  Ampicillin-Sulbactam (UNASYN) 3 g in sodium chloride 0.9 % 100 mL IVPB        3 g 200 mL/hr over 30 Minutes Intravenous  Once 05/07/22 0049 05/07/22 0154   05/06/22 2130  penicillin g benzathine (BICILLIN LA) 1200000 UNIT/2ML injection 1.2 Million Units        1.2 Million Units Intramuscular  Once 05/06/22 2124 05/06/22 2145        Medication:   dexamethasone (DECADRON) injection  6 mg Intravenous Q24H   enoxaparin (LOVENOX) injection  40 mg Subcutaneous Q24H    acetaminophen **OR** acetaminophen, ketorolac, ondansetron **OR** ondansetron (ZOFRAN) IV   Objective:   Vitals:   05/07/22 0230 05/07/22 0313 05/07/22 0658 05/07/22 0802  BP: 94/64 107/71 102/69   Pulse: 65 67 64   Resp:  18 14   Temp:  (!) 97.5 F (36.4 C) 97.6 F (36.4 C)   TempSrc:  Oral Oral   SpO2: 95% 98% 98% 98%  Weight:      Height:        Intake/Output Summary (Last 24 hours) at 05/07/2022 1140 Last data filed at 05/06/2022 2315 Gross per 24 hour  Intake 1000 ml  Output --  Net 1000 ml   Filed Weights   05/06/22 1340  Weight: 58.5 kg     Examination:   Physical Exam  Constitution:  Alert, cooperative, no distress,  Appears calm and comfortable  Psychiatric:   Normal and stable mood and affect, cognition intact,   HEENT:       Bilateral lower facial parotid gland edema, tenderness Otherwise- normocephalic, PERRL, otherwise with in Normal limits  Chest:         Chest symmetric Cardio vascular:  S1/S2, RRR, No murmure, No Rubs or Gallops  pulmonary: Clear to auscultation bilaterally, respirations unlabored, negative wheezes / crackles Abdomen: Soft, non-tender,  non-distended, bowel sounds,no masses, no organomegaly Muscular skeletal: Limited exam - in bed, able to move all 4 extremities,   Neuro: CNII-XII intact. , normal motor and sensation, reflexes intact  Extremities: No pitting edema lower extremities, +2 pulses  Skin: Dry, warm to touch, negative for any Rashes, No open wounds Wounds: per nursing documentation   ------------------------------------------------------------------------------------------------------------------------------------------    LABs:     Latest Ref Rng & Units 05/06/2022    9:36 PM 02/27/2022   11:45 AM 10/01/2021    4:54 PM  CBC  WBC 4.0 - 10.5 K/uL 10.7  2.7  3.2   Hemoglobin 12.0 - 15.0 g/dL 11.6  13.5  12.6   Hematocrit 36.0 - 46.0 % 36.6  42.2  40.2   Platelets 150 - 400 K/uL 296  204  200       Latest Ref Rng & Units 05/06/2022    9:36 PM 02/27/2022   11:45 AM 10/01/2021    4:54  PM  CMP  Glucose 70 - 99 mg/dL 89  90  89   BUN 6 - 20 mg/dL '11  9  9   '$ Creatinine 0.44 - 1.00 mg/dL 0.77  0.89  0.81   Sodium 135 - 145 mmol/L 135  137  136   Potassium 3.5 - 5.1 mmol/L 3.5  3.9  4.0   Chloride 98 - 111 mmol/L 104  104  102   CO2 22 - 32 mmol/L '23  26  29   '$ Calcium 8.9 - 10.3 mg/dL 8.7  9.0  9.0   Total Protein 6.5 - 8.1 g/dL  9.2  8.5   Total Bilirubin 0.3 - 1.2 mg/dL  0.5  0.4   Alkaline Phos 38 - 126 U/L  51  69   AST 15 - 41 U/L  24  34   ALT 0 - 44 U/L  14  23        Micro Results Recent Results (from the past 240 hour(s))  Resp panel by RT-PCR (RSV, Flu A&B, Covid) Anterior Nasal Swab     Status: None   Collection Time: 05/06/22  1:49 PM   Specimen: Anterior Nasal Swab  Result Value Ref Range Status   SARS Coronavirus 2 by RT PCR NEGATIVE NEGATIVE Final    Comment: (NOTE) SARS-CoV-2 target nucleic acids are NOT DETECTED.  The SARS-CoV-2 RNA is generally detectable in upper respiratory specimens during the acute phase of infection. The lowest concentration of SARS-CoV-2 viral copies this  assay can detect is 138 copies/mL. A negative result does not preclude SARS-Cov-2 infection and should not be used as the sole basis for treatment or other patient management decisions. A negative result may occur with  improper specimen collection/handling, submission of specimen other than nasopharyngeal swab, presence of viral mutation(s) within the areas targeted by this assay, and inadequate number of viral copies(<138 copies/mL). A negative result must be combined with clinical observations, patient history, and epidemiological information. The expected result is Negative.  Fact Sheet for Patients:  EntrepreneurPulse.com.au  Fact Sheet for Healthcare Providers:  IncredibleEmployment.be  This test is no t yet approved or cleared by the [place] FDA and  has been authorized for detection and/or diagnosis of SARS-CoV-2 by FDA under an Emergency Use Authorization (EUA). This EUA will remain  in effect (meaning this test can be used) for the duration of the COVID-19 declaration under Section 564(b)(1) of the Act, 21 U.S.C.section 360bbb-3(b)(1), unless the authorization is terminated  or revoked sooner.       Influenza A by PCR NEGATIVE NEGATIVE Final   Influenza B by PCR NEGATIVE NEGATIVE Final    Comment: (NOTE) The Xpert Xpress SARS-CoV-2/FLU/RSV plus assay is intended as an aid in the diagnosis of influenza from Nasopharyngeal swab specimens and should not be used as a sole basis for treatment. Nasal washings and aspirates are unacceptable for Xpert Xpress SARS-CoV-2/FLU/RSV testing.  Fact Sheet for Patients: EntrepreneurPulse.com.au  Fact Sheet for Healthcare Providers: IncredibleEmployment.be  This test is not yet approved or cleared by the [place] FDA and has been authorized for detection and/or diagnosis of SARS-CoV-2 by FDA under an Emergency Use Authorization (EUA). This EUA will  remain in effect (meaning this test can be used) for the duration of the COVID-19 declaration under Section 564(b)(1) of the Act, 21 U.S.C. section 360bbb-3(b)(1), unless the authorization is terminated or revoked.     Resp Syncytial Virus by PCR NEGATIVE NEGATIVE Final    Comment: (NOTE) Fact Sheet  for Patients: EntrepreneurPulse.com.au  Fact Sheet for Healthcare Providers: IncredibleEmployment.be  This test is not yet approved or cleared by the [place] FDA and has been authorized for detection and/or diagnosis of SARS-CoV-2 by FDA under an Emergency Use Authorization (EUA). This EUA will remain in effect (meaning this test can be used) for the duration of the COVID-19 declaration under Section 564(b)(1) of the Act, 21 U.S.C. section 360bbb-3(b)(1), unless the authorization is terminated or revoked.  Performed at [facility], [address] [place], [place] [address]   Group A Strep by PCR     Status: Abnormal   Collection Time: 05/06/22  7:32 PM   Specimen: Urine, Clean Catch; Sterile Swab  Result Value Ref Range Status   Group A Strep by PCR DETECTED (A) NOT DETECTED Final    Comment: Performed at [facility], [address] [place], [place] [address]  Culture, blood (single)     Status: None (Preliminary result)   Collection Time: 05/07/22  1:23 AM   Specimen: BLOOD RIGHT HAND  Result Value Ref Range Status   Specimen Description BLOOD RIGHT HAND  Final   Special Requests   Final    BOTTLES DRAWN AEROBIC AND ANAEROBIC Blood Culture adequate volume Performed at [facility], [address], [place], [place] [address]    Culture PENDING  Incomplete   Report Status PENDING  Incomplete    Radiology Reports CT Soft Tissue Neck W Contrast  Result Date: 05/07/2022 CLINICAL DATA:  Fever EXAM: CT NECK WITH CONTRAST TECHNIQUE: Multidetector CT imaging of the neck was performed using the standard protocol following the bolus  administration of intravenous contrast. RADIATION DOSE REDUCTION: This exam was performed according to the departmental dose-optimization program which includes automated exposure control, adjustment of the mA and/or kV according to patient size and/or use of iterative reconstruction technique. CONTRAST:  59m OMNIPAQUE IOHEXOL 300 MG/ML  SOLN COMPARISON:  07/16/2021 FINDINGS: Pharynx and larynx: Normal. No mass or swelling. Salivary glands: Both parotid glands are enlarged and heterogeneous with numerous calcifications. Atrophic appearance of the submandibular glands. No ductal dilatation. Previously seen fluid density structure near the right sublingual gland has resolved. Thyroid: Enlarged and heterogeneous left thyroid lobe. Lymph nodes: None enlarged or abnormal density. Numerous bilateral subcentimeter nodes have progressed slightly since 07/16/2021. Vascular: Negative. Limited intracranial: Negative. Visualized orbits: Negative. Mastoids and visualized paranasal sinuses: Moderate ethmoid, maxillary and sphenoid sinus mucosal thickening. Mastoids are clear. Skeleton: No acute or aggressive process. Upper chest: Negative. Other: None. IMPRESSION: 1. Both parotid glands are enlarged and heterogeneous with numerous calcifications. Atrophic appearance of the submandibular glands. Findings are consistent with chronic inflammatory disease of the salivary glands, such as Sjogren disease. 2. Incidental heterogeneous and enlarged thyroid. Recommend non-emergent thyroid ultrasound. Reference: J Am Coll Radiol. 2015 Feb;12(2): 143-50 3. Numerous subcentimeter bilateral cervical lymph nodes, progressed from 07/16/2021. 4. Moderate paranasal sinus disease. Electronically Signed   By: KUlyses JarredM.D.   On: 05/07/2022 00:40    SIGNED: SDeatra James MD, FHM. [facility],  Pager (please use [email or web address] to page/text) Please use Epic Secure Chat for non-urgent communication (7AM-7PM)  If 7PM-7AM, please  contact night-coverage [email or web address], 05/07/2022, 11:40 AM

## 2022-05-07 NOTE — Hospital Course (Addendum)
April Acevedo is a 47 y.o. female with medical history significant of rheumatoid arthritis, breast cancer being followed by Dr. Delton Coombes and surgical history of tonsillectomy as a child and surgical removal of his salivary stone last year who presents emergency department due to several days of onset of subjective fever, cold-like symptoms, myalgia, nasal congestion and nonproductive cough.  She also complained of several days of sore throat with significant swelling of bilateral parotid glands (R > L).  She endorsed fever of 104F at home, she complained of increased swallowing difficulty due to sore throat.   ED Course:  In the emergency department, BP was soft at 95/61, but other vital signs are within normal range.  Workup in the ED showed normal CBC except for normocytic anemia and WBC of 10.7.  BMP was normal, urinalysis was unimpressive for UTI, pregnancy test was negative and lactic acid was 0.8.  Group A streptococcal by PCR was detected, blood culture was negative, influenza A, B, SARS coronavirus 2, RSV was negative. CT neck with contrast showed both parotid glands are enlarged and atherogenesis with numerous calcifications.  Atrophic appearance of the submandibular glands findings are consistent with chronic inflammatory disease of the salivary glands, such as Sjogren's disease.  Incidental heterogeneous and enlarged thyroid was noted. Patient was treated with IV Unasyn, Decadron, Benadryl, Norco, Toradol, Zofran, Compazine and benzathine penicillin were given. Hospitalist was asked to admit patient for further evaluation and management.  Assessment & Plan:   Principal Problem:   Parotitis Active Problems:   Group A streptococcal infection   History of breast cancer   Rheumatoid arthritis ([place])     Bilateral Parotitis -Still complaining of bilateral parotid gland tenderness swelling Patient was started with Unasyn, we shall continue with same at this time Continue IV Decadron 6  mg daily Continue IV Toradol 15 mg every 6 hours as needed and transition to oral NSAIDs when patient sore throat and painful swallowing improves -Encouraged soft diet -Encouraging lemon, lime orally to stimulate gland secretion   Strep throat Group A strep by PCR was detected Benzathine Penicillin x 1 was already given   Rheumatoid arthritis Continue Toradol   History of breast cancer Stable, follows with Dr. Delton Coombes

## 2022-05-07 NOTE — Sepsis Progress Note (Signed)
Following for sepsis monitoring ?

## 2022-05-07 NOTE — TOC Progression Note (Signed)
Transition of Care [facility]) - Progression Note    Patient Details  Name: April Acevedo MRN: [id number] Date of Birth: 1976-03-06  Transition of Care [facility] CM/SW Contact  Salome Arnt, [place] Phone Number: 05/07/2022, 10:28 AM  Clinical Narrative:   Transition of Care (TOC) Screening Note   Patient Details  Name: April Acevedo Date of Birth: 1976/01/07   Transition of Care [facility]) CM/SW Contact:    Salome Arnt, [place] Phone Number: 05/07/2022, 10:28 AM    Transition of Care Department [facility]) has reviewed patient and no TOC needs have been identified at this time. We will continue to monitor patient advancement through interdisciplinary progression rounds. If new patient transition needs arise, please place a TOC consult.            Expected Discharge Plan and Services                                               Social Determinants of Health (SDOH) Interventions SDOH Screenings   Food Insecurity: No Food Insecurity (05/07/2022)  Housing: Low Risk  (05/07/2022)  Transportation Needs: No Transportation Needs (05/07/2022)  Utilities: Not At Risk (05/07/2022)  Alcohol Screen: Low Risk  (06/14/2020)  Depression (PHQ2-9): Medium Risk (06/14/2020)  Financial Resource Strain: High Risk (06/14/2020)  Physical Activity: Inactive (06/14/2020)  Social Connections: Socially Isolated (06/14/2020)  Stress: Stress Concern Present (06/14/2020)  Tobacco Use: Low Risk  (05/06/2022)    Readmission Risk Interventions     No data to display

## 2022-05-07 NOTE — H&P (Addendum)
History and Physical    Patient: April Acevedo FIE:[id number] DOB: [birth year] DOA: 05/06/2022 DOS: the patient was seen and examined on 05/07/2022 PCP: Patient, No Pcp Per  Patient coming from: Home  Chief Complaint:  Chief Complaint  Patient presents with   Fever   HPI: April Acevedo is a 47 y.o. female with medical history significant of rheumatoid arthritis, breast cancer being followed by Dr. Delton Coombes and surgical history of tonsillectomy as a child and surgical removal of his salivary stone last year who presents emergency department due to several days of onset of subjective fever, cold-like symptoms, myalgia, nasal congestion and nonproductive cough.  She also complained of several days of sore throat with significant swelling of bilateral parotid glands (R > L).  She endorsed fever of 104F at home, she complained of increased swallowing difficulty due to sore throat.  ED Course:  In the emergency department, BP was soft at 95/61, but other vital signs are within normal range.  Workup in the ED showed normal CBC except for normocytic anemia and WBC of 10.7.  BMP was normal, urinalysis was unimpressive for UTI, pregnancy test was negative and lactic acid was 0.8.  Group A streptococcal by PCR was detected, blood culture was negative, influenza A, B, SARS coronavirus 2, RSV was negative. CT neck with contrast showed both parotid glands are enlarged and atherogenesis with numerous calcifications.  Atrophic appearance of the submandibular glands findings are consistent with chronic inflammatory disease of the salivary glands, such as Sjogren's disease.  Incidental heterogeneous and enlarged thyroid was noted. Patient was treated with IV Unasyn, Decadron, Benadryl, Norco, Toradol, Zofran, Compazine and benzathine penicillin were given. Hospitalist was asked to admit patient for further evaluation and management.   Review of Systems: Review of systems as noted in the HPI. All other  systems reviewed and are negative.   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 She has never used smokeless tobacco. She reports current alcohol use. She reports that she does not use drugs.   No Known Allergies  Family History  Problem Relation Age of Onset   Parkinson's disease Paternal Grandfather    Colon cancer Paternal Grandmother    Kidney disease Maternal Grandmother    Ovarian cancer Maternal Grandmother    Aneurysm Maternal Grandfather    Suicidality Father    Diabetes Mother    Hyperthyroidism Mother    Aneurysm Brother    Colon cancer Sister      Prior to Admission medications   Medication Sig Start Date End Date Taking? Authorizing Provider  HYDROcodone-acetaminophen (NORCO/VICODIN) 5-325 MG tablet Take 1 tablet by mouth  every 4 (four) hours as needed for severe pain. 05/06/22  Yes Idol, Almyra Free, PA-C  ibuprofen (ADVIL) 600 MG tablet Take 1 tablet (600 mg total) by mouth 3 (three) times daily. 05/06/22  Yes Idol, Almyra Free, PA-C  exemestane (AROMASIN) 25 MG tablet TAKE 1 TABLET(25 MG) BY MOUTH DAILY AFTER AND BREAKFAST 01/23/21   Derek Jack, MD  NONFORMULARY OR  COMPOUNDED ITEM outpatient vestibular therapy. 02/26/20   Thurnell Lose, MD  traMADol (ULTRAM) 50 MG tablet Take 1 tablet (50 mg total) by mouth every 6 (six) hours as needed. 10/01/21   Margarita Mail, PA-C    Physical Exam: BP 102/69 (BP Location: Right Arm)   Pulse 64   Temp 97.6 F (36.4 C) (Oral)   Resp 14   Ht '5\' 4"'$  (1.626 m)   Wt 58.5 kg   SpO2 98%   BMI 22.14 kg/m   General: 47 y.o. year-old female well developed well nourished in no acute distress.  Alert and oriented x3. HEENT: NCAT, EOMI Neck: Supple, trachea medial, bilateral tender parotid glands (R > L) Cardiovascular: Regular rate and rhythm with no rubs or gallops.  No thyromegaly or JVD noted.  No lower extremity edema. 2/4 pulses in all 4 extremities. Respiratory: Clear to auscultation with no wheezes or rales. Good inspiratory effort. Abdomen: Soft, nontender nondistended with normal bowel sounds x4 quadrants. Muskuloskeletal: No cyanosis, clubbing or edema noted bilaterally Neuro: CN II-XII intact, strength 5/5 x 4, sensation, reflexes intact Skin: No ulcerative lesions noted or rashes Psychiatry: Mood is appropriate for condition and setting          Labs on Admission:  Basic Metabolic Panel: Recent Labs  Lab 05/06/22 2136  NA 135  K 3.5  CL 104  CO2 23  GLUCOSE 89  BUN 11  CREATININE 0.77  CALCIUM 8.7*   Liver Function Tests: No results for input(s): "AST", "ALT", "ALKPHOS", "BILITOT", "PROT", "ALBUMIN" in the last 168 hours. No results for input(s): "LIPASE", "AMYLASE" in the last 168 hours. No results for input(s): "AMMONIA" in the last 168 hours. CBC: Recent Labs  Lab 05/06/22 2136  WBC 10.7*  NEUTROABS 8.6*  HGB 11.6*  HCT 36.6  MCV 84.1  PLT 296   Cardiac Enzymes: No results for input(s): "CKTOTAL", "CKMB", "CKMBINDEX", "TROPONINI" in the last 168 hours.  BNP (last 3 results) No results for input(s): "BNP" in the last 8760 hours.  ProBNP (last 3 results) No results for  input(s): "PROBNP" in the last 8760 hours.  CBG: No results for input(s): "GLUCAP" in the last 168 hours.  Radiological Exams on Admission: CT Soft Tissue Neck W Contrast  Result Date: 05/07/2022 CLINICAL DATA:  Fever EXAM: CT NECK WITH CONTRAST TECHNIQUE: Multidetector CT imaging of the neck was performed using the standard protocol following the bolus administration of intravenous contrast. RADIATION DOSE REDUCTION: This exam was performed according to the departmental dose-optimization program which includes automated exposure control, adjustment of the mA and/or kV according to patient size and/or use of iterative reconstruction technique. CONTRAST:  87m OMNIPAQUE IOHEXOL 300 MG/ML  SOLN COMPARISON:  07/16/2021 FINDINGS: Pharynx and larynx: Normal. No mass or swelling. Salivary glands: Both parotid glands are enlarged and heterogeneous with numerous calcifications. Atrophic appearance of the submandibular glands. No ductal dilatation. Previously seen fluid density structure near the right sublingual gland has resolved. Thyroid: Enlarged and heterogeneous left thyroid lobe. Lymph nodes: None enlarged or abnormal density. Numerous bilateral subcentimeter nodes have progressed slightly since 07/16/2021. Vascular: Negative. Limited intracranial: Negative.  Visualized orbits: Negative. Mastoids and visualized paranasal sinuses: Moderate ethmoid, maxillary and sphenoid sinus mucosal thickening. Mastoids are clear. Skeleton: No acute or aggressive process. Upper chest: Negative. Other: None. IMPRESSION: 1. Both parotid glands are enlarged and heterogeneous with numerous calcifications. Atrophic appearance of the submandibular glands. Findings are consistent with chronic inflammatory disease of the salivary glands, such as Sjogren disease. 2. Incidental heterogeneous and enlarged thyroid. Recommend non-emergent thyroid ultrasound. Reference: J Am Coll Radiol. 2015 Feb;12(2): 143-50 3. Numerous subcentimeter  bilateral cervical lymph nodes, progressed from 07/16/2021. 4. Moderate paranasal sinus disease. Electronically Signed   By: Ulyses Jarred M.D.   On: 05/07/2022 00:40    EKG: I independently viewed the EKG done and my findings are as followed: EKG was not done in the ED  Assessment/Plan Present on Admission:  Parotitis  Principal Problem:   Parotitis  Parotitis Patient was started with Unasyn, we shall continue with same at this time Continue IV Decadron 6 mg daily Continue IV Toradol 15 mg every 6 hours as needed and transition to oral NSAIDs when patient sore throat and painful swallowing improves  Strep throat Group A strep by PCR was detected Benzathine Penicillin x 1 was already given  Rheumatoid arthritis Continue Toradol  History of breast cancer Stable, follows with Dr. Delton Coombes   DVT prophylaxis: Lovenox  Code Status: Full code  Family Communication: None at bedside  Consults: None  Severity of Illness: The appropriate patient status for this patient is INPATIENT. Inpatient status is judged to be reasonable and necessary in order to provide the required intensity of service to ensure the patient's safety. The patient's presenting symptoms, physical exam findings, and initial radiographic and laboratory data in the context of their chronic comorbidities is felt to place them at high risk for further clinical deterioration. Furthermore, it is not anticipated that the patient will be medically stable for discharge from the hospital within 2 midnights of admission.   * I certify that at the point of admission it is my clinical judgment that the patient will require inpatient hospital care spanning beyond 2 midnights from the point of admission due to high intensity of service, high risk for further deterioration and high frequency of surveillance required.*  Author: Bernadette Hoit, DO 05/07/2022 6:37 AM  For on call review [email or web address].

## 2022-05-08 DIAGNOSIS — K1120 Sialoadenitis, unspecified: Secondary | ICD-10-CM | POA: Diagnosis not present

## 2022-05-08 LAB — CBC
HCT: 30.8 % — ABNORMAL LOW (ref 36.0–46.0)
Hemoglobin: 10 g/dL — ABNORMAL LOW (ref 12.0–15.0)
MCH: 27.1 pg (ref 26.0–34.0)
MCHC: 32.5 g/dL (ref 30.0–36.0)
MCV: 83.5 fL (ref 80.0–100.0)
Platelets: 270 10*3/uL (ref 150–400)
RBC: 3.69 MIL/uL — ABNORMAL LOW (ref 3.87–5.11)
RDW: 12.7 % (ref 11.5–15.5)
WBC: 9.8 10*3/uL (ref 4.0–10.5)
nRBC: 0 % (ref 0.0–0.2)

## 2022-05-08 LAB — COMPREHENSIVE METABOLIC PANEL
ALT: 11 U/L (ref 0–44)
AST: 17 U/L (ref 15–41)
Albumin: 2.5 g/dL — ABNORMAL LOW (ref 3.5–5.0)
Alkaline Phosphatase: 49 U/L (ref 38–126)
Anion gap: 5 (ref 5–15)
BUN: 16 mg/dL (ref 6–20)
CO2: 21 mmol/L — ABNORMAL LOW (ref 22–32)
Calcium: 8.3 mg/dL — ABNORMAL LOW (ref 8.9–10.3)
Chloride: 111 mmol/L (ref 98–111)
Creatinine, Ser: 0.73 mg/dL (ref 0.44–1.00)
GFR, Estimated: >60 mL/min (ref >60)
Glucose, Bld: 100 mg/dL — ABNORMAL HIGH (ref 70–99)
Potassium: 3.6 mmol/L (ref 3.5–5.1)
Sodium: 137 mmol/L (ref 135–145)
Total Bilirubin: 0.5 mg/dL (ref 0.3–1.2)
Total Protein: 6.7 g/dL (ref 6.5–8.1)

## 2022-05-08 LAB — MAGNESIUM: Magnesium: 1.9 mg/dL (ref 1.7–2.4)

## 2022-05-08 LAB — PHOSPHORUS: Phosphorus: 3.2 mg/dL (ref 2.5–4.6)

## 2022-05-08 MED ORDER — SENNOSIDES-DOCUSATE SODIUM 8.6-50 MG PO TABS
1.0000 | ORAL_TABLET | Freq: Two times a day (BID) | ORAL | Status: DC
  Administered 2022-05-08 – 2022-05-09 (×3): 1 via ORAL
  Filled 2022-05-08 (×3): qty 1

## 2022-05-08 MED ORDER — SUMATRIPTAN SUCCINATE 6 MG/0.5ML ~~LOC~~ SOLN
6.0000 mg | Freq: Once | SUBCUTANEOUS | Status: AC
  Administered 2022-05-08: 6 mg via SUBCUTANEOUS
  Filled 2022-05-08 (×2): qty 0.5

## 2022-05-08 MED ORDER — POLYETHYLENE GLYCOL 3350 17 G PO PACK
17.0000 g | PACK | Freq: Every day | ORAL | Status: DC
  Administered 2022-05-08 – 2022-05-09 (×2): 17 g via ORAL
  Filled 2022-05-08 (×2): qty 1

## 2022-05-08 NOTE — Progress Notes (Signed)
PROGRESS NOTE    Patient: April Acevedo                            PCP: Patient, No Pcp Per                    DOB: 04-Sep-1975            DOA: 05/06/2022 ZTI:[id number]             DOS: 05/08/2022, 12:47 PM   LOS: 1 day   Date of Service: The patient was seen and examined on 05/08/2022  Subjective:   The patient was seen and examined this morning, stating some improvement in bilateral facial swelling and tenderness over her parotid glands Unable to open her mouth fully, mild difficulty with swallowing otherwise tolerating soft diet. Also complaining of abdominal cramping-no bowel movements   Brief Narrative:   April Acevedo is a 47 y.o. female with medical history significant of rheumatoid arthritis, breast cancer being followed by Dr. Delton Coombes and surgical history of tonsillectomy as a child and surgical removal of his salivary stone last year who presents emergency department due to several days of onset of subjective fever, cold-like symptoms, myalgia, nasal congestion and nonproductive cough.  She also complained of several days of sore throat with significant swelling of bilateral parotid glands (R > L).  She endorsed fever of 104F at home, she complained of increased swallowing difficulty due to sore throat.   ED Course:  In the emergency department, BP was soft at 95/61, but other vital signs are within normal range.  Workup in the ED showed normal CBC except for normocytic anemia and WBC of 10.7.  BMP was normal, urinalysis was unimpressive for UTI, pregnancy test was negative and lactic acid was 0.8.  Group A streptococcal by PCR was detected, blood culture was negative, influenza A, B, SARS coronavirus 2, RSV was negative. CT neck with contrast showed both parotid glands are enlarged and atherogenesis with numerous calcifications.  Atrophic appearance of the submandibular glands findings are consistent with chronic inflammatory disease of the salivary glands, such as  Sjogren's disease.  Incidental heterogeneous and enlarged thyroid was noted. Patient was treated with IV Unasyn, Decadron, Benadryl, Norco, Toradol, Zofran, Compazine and benzathine penicillin were given. Hospitalist was asked to admit patient for further evaluation and management.  Assessment & Plan:   Principal Problem:   Parotitis Active Problems:   Group A streptococcal infection   History of breast cancer   Rheumatoid arthritis ([place])     Bilateral Parotitis -Improvement in edema and tenderness of bilateral parotid glands -Able to swallow and tolerate soft diet -Still have difficulty fully opening her jaw  -Continue with IV Unasyn, - Continue IV Decadron 6 mg daily 1 more day before switching to p.o. - Continue IV Toradol 15 mg every 6 hours as needed and transition to oral NSAIDs when patient sore throat and painful swallowing improves -Encouraged soft diet -Encouraging lemon, lime orally to stimulate gland secretion   Strep throat Group A strep by PCR was detected Benzathine Penicillin x 1 was already given   Rheumatoid arthritis Continue Toradol   Abdominal cramping/constipation  -Patient on bowel regimen  History of breast cancer Stable, follows with Dr. Delton Coombes    ------------------------------------------------------------------------------------------------------------------------------------- Nutritional status:  The patient's BMI is: Body mass index is 22.14 kg/m. I agree with the assessment and plan as outlined ---------------------------------------------------------------------------------------------------------------------------------  DVT prophylaxis:  enoxaparin (LOVENOX) injection 40  mg Start: 05/07/22 1000 SCDs Start: 05/07/22 0802   Code Status:   Code Status: Full Code  Family Communication: No family member present at bedside- attempt will be made to update daily The above findings and plan of care has been discussed with patient (and  family)  in detail,  they expressed understanding and agreement of above. -Advance care planning has been discussed.   Admission status:   Status is: Inpatient Remains inpatient appropriate because: IV antibiotics, IV fluids     Procedures:   No admission procedures for hospital encounter.   Antimicrobials:  Anti-infectives (From admission, onward)    Start     Dose/Rate Route Frequency Ordered Stop   05/07/22 0900  Ampicillin-Sulbactam (UNASYN) 3 g in sodium chloride 0.9 % 100 mL IVPB        3 g 200 mL/hr over 30 Minutes Intravenous Every 6 hours 05/07/22 0757     05/07/22 0100  Ampicillin-Sulbactam (UNASYN) 3 g in sodium chloride 0.9 % 100 mL IVPB        3 g 200 mL/hr over 30 Minutes Intravenous  Once 05/07/22 0049 05/07/22 0154   05/06/22 2130  penicillin g benzathine (BICILLIN LA) 1200000 UNIT/2ML injection 1.2 Million Units        1.2 Million Units Intramuscular  Once 05/06/22 2124 05/06/22 2145        Medication:   dexamethasone (DECADRON) injection  6 mg Intravenous Q24H   enoxaparin (LOVENOX) injection  40 mg Subcutaneous Q24H   polyethylene glycol  17 g Oral Daily   senna-docusate  1 tablet Oral BID    acetaminophen **OR** acetaminophen, ketorolac, ondansetron **OR** ondansetron (ZOFRAN) IV   Objective:   Vitals:   05/07/22 0802 05/07/22 1253 05/07/22 2013 05/08/22 0403  BP:  108/72 107/69 102/67  Pulse:  61 64 (!) 59  Resp:  '17 18 18  '$ Temp:  98.6 F (37 C) 98.3 F (36.8 C) 97.9 F (36.6 C)  TempSrc:   Oral Oral  SpO2: 98% 98% 97% 97%  Weight:      Height:        Intake/Output Summary (Last 24 hours) at 05/08/2022 1247 Last data filed at 05/08/2022 0314 Gross per 24 hour  Intake 3139.24 ml  Output --  Net 3139.24 ml   Filed Weights   05/06/22 1340  Weight: 58.5 kg     Examination:     General:  AAO x 3,  cooperative, no distress;   HEENT:  Improved bilateral parotid gland edema, still tenderness  Open airways Unable to open her mouth  fully--mild difficulty with swallowing- normocephalic, PERRL, otherwise with in Normal limits   Neuro:  CNII-XII intact. , normal motor and sensation, reflexes intact   Lungs:   Clear to auscultation BL, Respirations unlabored,  No wheezes / crackles  Cardio:    S1/S2, RRR, No murmure, No Rubs or Gallops   Abdomen:  Soft, non-tender, bowel sounds active all four quadrants, no guarding or peritoneal signs.  Muscular  skeletal:  Limited exam -global generalized weaknesses - in bed, able to move all 4 extremities,   2+ pulses,  symmetric, No pitting edema  Skin:  Dry, warm to touch, negative for any Rashes,  Wounds: Please see nursing documentation         ------------------------------------------------------------------------------------------------------------------------------------------    LABs:     Latest Ref Rng & Units 05/08/2022    3:21 AM 05/06/2022    9:36 PM 02/27/2022   11:45 AM  CBC  WBC 4.0 -  10.5 K/uL 9.8  10.7  2.7   Hemoglobin 12.0 - 15.0 g/dL 10.0  11.6  13.5   Hematocrit 36.0 - 46.0 % 30.8  36.6  42.2   Platelets 150 - 400 K/uL 270  296  204       Latest Ref Rng & Units 05/08/2022    3:21 AM 05/06/2022    9:36 PM 02/27/2022   11:45 AM  CMP  Glucose 70 - 99 mg/dL 100  89  90   BUN 6 - 20 mg/dL '16  11  9   '$ Creatinine 0.44 - 1.00 mg/dL 0.73  0.77  0.89   Sodium 135 - 145 mmol/L 137  135  137   Potassium 3.5 - 5.1 mmol/L 3.6  3.5  3.9   Chloride 98 - 111 mmol/L 111  104  104   CO2 22 - 32 mmol/L '21  23  26   '$ Calcium 8.9 - 10.3 mg/dL 8.3  8.7  9.0   Total Protein 6.5 - 8.1 g/dL 6.7   9.2   Total Bilirubin 0.3 - 1.2 mg/dL 0.5   0.5   Alkaline Phos 38 - 126 U/L 49   51   AST 15 - 41 U/L 17   24   ALT 0 - 44 U/L 11   14        Micro Results Recent Results (from the past 240 hour(s))  Resp panel by RT-PCR (RSV, Flu A&B, Covid) Anterior Nasal Swab     Status: None   Collection Time: 05/06/22  1:49 PM   Specimen: Anterior Nasal Swab  Result Value Ref  Range Status   SARS Coronavirus 2 by RT PCR NEGATIVE NEGATIVE Final    Comment: (NOTE) SARS-CoV-2 target nucleic acids are NOT DETECTED.  The SARS-CoV-2 RNA is generally detectable in upper respiratory specimens during the acute phase of infection. The lowest concentration of SARS-CoV-2 viral copies this assay can detect is 138 copies/mL. A negative result does not preclude SARS-Cov-2 infection and should not be used as the sole basis for treatment or other patient management decisions. A negative result may occur with  improper specimen collection/handling, submission of specimen other than nasopharyngeal swab, presence of viral mutation(s) within the areas targeted by this assay, and inadequate number of viral copies(<138 copies/mL). A negative result must be combined with clinical observations, patient history, and epidemiological information. The expected result is Negative.  Fact Sheet for Patients:  EntrepreneurPulse.com.au  Fact Sheet for Healthcare Providers:  IncredibleEmployment.be  This test is no t yet approved or cleared by the [place] FDA and  has been authorized for detection and/or diagnosis of SARS-CoV-2 by FDA under an Emergency Use Authorization (EUA). This EUA will remain  in effect (meaning this test can be used) for the duration of the COVID-19 declaration under Section 564(b)(1) of the Act, 21 U.S.C.section 360bbb-3(b)(1), unless the authorization is terminated  or revoked sooner.       Influenza A by PCR NEGATIVE NEGATIVE Final   Influenza B by PCR NEGATIVE NEGATIVE Final    Comment: (NOTE) The Xpert Xpress SARS-CoV-2/FLU/RSV plus assay is intended as an aid in the diagnosis of influenza from Nasopharyngeal swab specimens and should not be used as a sole basis for treatment. Nasal washings and aspirates are unacceptable for Xpert Xpress SARS-CoV-2/FLU/RSV testing.  Fact Sheet for  Patients: EntrepreneurPulse.com.au  Fact Sheet for Healthcare Providers: IncredibleEmployment.be  This test is not yet approved or cleared by the [place] FDA and has been  authorized for detection and/or diagnosis of SARS-CoV-2 by FDA under an Emergency Use Authorization (EUA). This EUA will remain in effect (meaning this test can be used) for the duration of the COVID-19 declaration under Section 564(b)(1) of the Act, 21 U.S.C. section 360bbb-3(b)(1), unless the authorization is terminated or revoked.     Resp Syncytial Virus by PCR NEGATIVE NEGATIVE Final    Comment: (NOTE) Fact Sheet for Patients: EntrepreneurPulse.com.au  Fact Sheet for Healthcare Providers: IncredibleEmployment.be  This test is not yet approved or cleared by the [place] FDA and has been authorized for detection and/or diagnosis of SARS-CoV-2 by FDA under an Emergency Use Authorization (EUA). This EUA will remain in effect (meaning this test can be used) for the duration of the COVID-19 declaration under Section 564(b)(1) of the Act, 21 U.S.C. section 360bbb-3(b)(1), unless the authorization is terminated or revoked.  Performed at [facility], [address] [place], [place] [address]   Group A Strep by PCR     Status: Abnormal   Collection Time: 05/06/22  7:32 PM   Specimen: Urine, Clean Catch; Sterile Swab  Result Value Ref Range Status   Group A Strep by PCR DETECTED (A) NOT DETECTED Final    Comment: Performed at [facility], [address] [place], [place] [address]  Culture, blood (single)     Status: None (Preliminary result)   Collection Time: 05/07/22  1:23 AM   Specimen: BLOOD RIGHT HAND  Result Value Ref Range Status   Specimen Description BLOOD RIGHT HAND  Final   Special Requests   Final    BOTTLES DRAWN AEROBIC AND ANAEROBIC Blood Culture adequate volume Performed at [facility], [address],  [place], [place] [address]    Culture PENDING  Incomplete   Report Status PENDING  Incomplete    Radiology Reports No results found.  SIGNED: Deatra James, MD, FHM. [facility],  Pager (please use [email or web address] to page/text) Please use Epic Secure Chat for non-urgent communication (7AM-7PM)  If 7PM-7AM, please contact night-coverage [email or web address], 05/08/2022, 12:47 PM

## 2022-05-08 NOTE — Progress Notes (Signed)
Patient rested well in hospital bed. PRN medications given during the night. PRN medications effective.

## 2022-05-09 ENCOUNTER — Inpatient Hospital Stay (HOSPITAL_COMMUNITY): Payer: Medicare Other

## 2022-05-09 ENCOUNTER — Other Ambulatory Visit (HOSPITAL_COMMUNITY): Payer: Self-pay | Admitting: Hematology

## 2022-05-09 DIAGNOSIS — K1120 Sialoadenitis, unspecified: Secondary | ICD-10-CM | POA: Diagnosis not present

## 2022-05-09 LAB — CBC
HCT: 32 % — ABNORMAL LOW (ref 36.0–46.0)
Hemoglobin: 10.1 g/dL — ABNORMAL LOW (ref 12.0–15.0)
MCH: 26.9 pg (ref 26.0–34.0)
MCHC: 31.6 g/dL (ref 30.0–36.0)
MCV: 85.1 fL (ref 80.0–100.0)
Platelets: 295 10*3/uL (ref 150–400)
RBC: 3.76 MIL/uL — ABNORMAL LOW (ref 3.87–5.11)
RDW: 12.8 % (ref 11.5–15.5)
WBC: 6.9 10*3/uL (ref 4.0–10.5)
nRBC: 0 % (ref 0.0–0.2)

## 2022-05-09 LAB — COMPREHENSIVE METABOLIC PANEL
ALT: 12 U/L (ref 0–44)
AST: 20 U/L (ref 15–41)
Albumin: 2.4 g/dL — ABNORMAL LOW (ref 3.5–5.0)
Alkaline Phosphatase: 47 U/L (ref 38–126)
Anion gap: 5 (ref 5–15)
BUN: 19 mg/dL (ref 6–20)
CO2: 23 mmol/L (ref 22–32)
Calcium: 8.2 mg/dL — ABNORMAL LOW (ref 8.9–10.3)
Chloride: 110 mmol/L (ref 98–111)
Creatinine, Ser: 0.82 mg/dL (ref 0.44–1.00)
GFR, Estimated: >60 mL/min (ref >60)
Glucose, Bld: 84 mg/dL (ref 70–99)
Potassium: 3.5 mmol/L (ref 3.5–5.1)
Sodium: 138 mmol/L (ref 135–145)
Total Bilirubin: 0.6 mg/dL (ref 0.3–1.2)
Total Protein: 6.4 g/dL — ABNORMAL LOW (ref 6.5–8.1)

## 2022-05-09 MED ORDER — CEPHALEXIN 500 MG PO CAPS: 500.0000 mg | ORAL_CAPSULE | Freq: Three times a day (TID) | ORAL | 0 refills | Status: AC

## 2022-05-09 MED ORDER — LACTINEX PO CHEW: 1.0000 | CHEWABLE_TABLET | Freq: Three times a day (TID) | ORAL | 0 refills | Status: AC

## 2022-05-09 MED ORDER — ALUM & MAG HYDROXIDE-SIMETH 200-200-20 MG/5ML PO SUSP
30.0000 mL | ORAL | Status: DC
  Administered 2022-05-09: 30 mL via ORAL
  Filled 2022-05-09: qty 30

## 2022-05-09 MED ORDER — TRAMADOL HCL 50 MG PO TABS: 50.0000 mg | ORAL_TABLET | Freq: Four times a day (QID) | ORAL | 0 refills | Status: AC | PRN

## 2022-05-09 MED ORDER — ALUM & MAG HYDROXIDE-SIMETH 200-200-20 MG/5ML PO SUSP: 30.0000 mL | ORAL | 0 refills | Status: AC

## 2022-05-09 MED ORDER — METHYLPREDNISOLONE 4 MG PO TBPK: ORAL_TABLET | ORAL | 0 refills | Status: AC

## 2022-05-09 NOTE — Care Management Important Message (Signed)
Important Message  Patient Details  Name: April Acevedo MRN: [id number] Date of Birth: Sep 05, 1975   Medicare Important Message Given:  Yes     Tommy Medal 05/09/2022, 11:58 AM

## 2022-05-09 NOTE — Progress Notes (Signed)
At beginning of shift patient c/o headache 10/10. She stated that she was not getting relief from PRN medications that was given during the 7A-7P shift. This nurse assessed patient and gave PRN tylenol at 2055. Patient had no relief from PRN tylenol. Message sent to Dr. Clearence Ped requesting another medication to help with patient headache. 1 time dose of SUMAtriptan ordered and given at 2252. The SUMAtriptan was effective and patient rested well.

## 2022-05-09 NOTE — Discharge Summary (Signed)
Physician Discharge Summary   Patient: April Acevedo MRN: [id number] DOB: 1976/02/06  Admit date:     05/06/2022  Discharge date: 05/09/22  Discharge Physician: Deatra James   PCP: Patient, No Pcp Per   Recommendations at discharge:    Follow-up with a PCP, referral to ENT, in 1-4 weeks Continue soft diet advance as tolerated Complete currently recommended to taper down steroids, p.o. antibiotics  Discharge Diagnoses: Principal Problem:   Parotitis Active Problems:   Group A streptococcal infection   History of breast cancer   Rheumatoid arthritis ([place])  Resolved Problems:   * No resolved hospital problems. *  Hospital Course: April Acevedo is a 47 y.o. female with medical history significant of rheumatoid arthritis, breast cancer being followed by Dr. Delton Coombes and surgical history of tonsillectomy as a child and surgical removal of his salivary stone last year who presents emergency department due to several days of onset of subjective fever, cold-like symptoms, myalgia, nasal congestion and nonproductive cough.  She also complained of several days of sore throat with significant swelling of bilateral parotid glands (R > L).  She endorsed fever of 104F at home, she complained of increased swallowing difficulty due to sore throat.   ED Course:  In the emergency department, BP was soft at 95/61, but other vital signs are within normal range.  Workup in the ED showed normal CBC except for normocytic anemia and WBC of 10.7.  BMP was normal, urinalysis was unimpressive for UTI, pregnancy test was negative and lactic acid was 0.8.  Group A streptococcal by PCR was detected, blood culture was negative, influenza A, B, SARS coronavirus 2, RSV was negative. CT neck with contrast showed both parotid glands are enlarged and atherogenesis with numerous calcifications.  Atrophic appearance of the submandibular glands findings are consistent with chronic inflammatory disease of the  salivary glands, such as Sjogren's disease.  Incidental heterogeneous and enlarged thyroid was noted. Patient was treated with IV Unasyn, Decadron, Benadryl, Norco, Toradol, Zofran, Compazine and benzathine penicillin were given. Hospitalist was asked to admit patient for further evaluation and management.   Bilateral Parotitis -Improvement in edema and tenderness of bilateral parotid glands -Able to swallow and tolerate soft diet -Still have difficulty fully opening her jaw  -Continue with IV Unasyn>>> switch to p.o. Keflex - Continue IV Decadron 6 mg daily >>> switch to Medrol Dosepak taper ransition to oral NSAIDs   -Encouraging lemon, lime orally to stimulate gland secretion   Strep throat Group A strep by PCR was detected Benzathine Penicillin x 1 was already given Continue with p.o. antibiotics of Keflex   Rheumatoid arthritis Continue Toradol   Abdominal cramping/constipation  -Patient on bowel regimen -KUB obtained negative good gas pattern-as needed Maalox   History of breast cancer Stable, follows with Dr. Delton Coombes  Assessment and Plan: No notes have been filed under this hospital service. Service: Hospitalist      Procedures performed: KUB Disposition: Home Diet recommendation:  Discharge Diet Orders (From admission, onward)     Start     Ordered   05/09/22 0000  Diet - low sodium heart healthy        05/09/22 1201           Full liquid diet DISCHARGE MEDICATION: Allergies as of 05/09/2022   No Known Allergies      Medication List     STOP taking these medications    NONFORMULARY OR COMPOUNDED ITEM       TAKE these medications  alum & mag hydroxide-simeth 200-200-20 MG/5ML suspension Commonly known as: MAALOX/MYLANTA Take 30 mLs by mouth every 4 (four) hours.   cephALEXin 500 MG capsule Commonly known as: KEFLEX Take 1 capsule (500 mg total) by mouth 3 (three) times daily for 5 days.   exemestane 25 MG tablet Commonly known  as: AROMASIN TAKE 1 TABLET(25 MG) BY MOUTH DAILY AFTER AND BREAKFAST   HYDROcodone-acetaminophen 5-325 MG tablet Commonly known as: NORCO/VICODIN Take 1 tablet by mouth every 4 (four) hours as needed for severe pain.   ibuprofen 600 MG tablet Commonly known as: ADVIL Take 1 tablet (600 mg total) by mouth 3 (three) times daily. What changed:  medication strength how much to take when to take this reasons to take this Another medication with the same name was removed. Continue taking this medication, and follow the directions you see here.   lactobacillus acidophilus & bulgar chewable tablet Chew 1 tablet by mouth 3 (three) times daily with meals for 5 days.   methylPREDNISolone 4 MG Tbpk tablet Commonly known as: MEDROL DOSEPAK Medrol Dosepak take as instructed   traMADol 50 MG tablet Commonly known as: ULTRAM Take 1 tablet (50 mg total) by mouth every 6 (six) hours as needed for up to 3 days.        Discharge Exam: Filed Weights   05/06/22 1340  Weight: 58.5 kg      Physical Exam:   General:  AAO x 3,  cooperative, no distress;   HEENT:  Bilateral parotid gland edema and tenderness much improved from admission  normocephalic, PERRL, otherwise with in Normal limits   Neuro:  CNII-XII intact. , normal motor and sensation, reflexes intact   Lungs:   Clear to auscultation BL, Respirations unlabored,  No wheezes / crackles  Cardio:    S1/S2, RRR, No murmure, No Rubs or Gallops   Abdomen:  Soft, non-tender, bowel sounds active all four quadrants, no guarding or peritoneal signs.  Muscular  skeletal:  Limited exam -global generalized weaknesses - in bed, able to move all 4 extremities,   2+ pulses,  symmetric, No pitting edema  Skin:  Dry, warm to touch, negative for any Rashes,  Wounds: Please see nursing documentation          Condition at discharge: good  The results of significant diagnostics from this hospitalization (including imaging, microbiology,  ancillary and laboratory) are listed below for reference.   Imaging Studies: DG Abd 1 View  Result Date: 05/09/2022 CLINICAL DATA:  Upper abdominal pain for 2 days EXAM: ABDOMEN - 1 VIEW COMPARISON:  May 09, 2020 FINDINGS: Bowel gas pattern is nonobstructive without abnormal fecal loading. No free air, portal venous gas, or pneumatosis identified on a single supine image. No other abnormalities. IMPRESSION: Nonobstructive bowel gas pattern. Electronically Signed   By: Dorise Bullion III M.D.   On: 05/09/2022 10:16   CT Soft Tissue Neck W Contrast  Result Date: 05/07/2022 CLINICAL DATA:  Fever EXAM: CT NECK WITH CONTRAST TECHNIQUE: Multidetector CT imaging of the neck was performed using the standard protocol following the bolus administration of intravenous contrast. RADIATION DOSE REDUCTION: This exam was performed according to the departmental dose-optimization program which includes automated exposure control, adjustment of the mA and/or kV according to patient size and/or use of iterative reconstruction technique. CONTRAST:  79m OMNIPAQUE IOHEXOL 300 MG/ML  SOLN COMPARISON:  07/16/2021 FINDINGS: Pharynx and larynx: Normal. No mass or swelling. Salivary glands: Both parotid glands are enlarged and heterogeneous with numerous calcifications. Atrophic  appearance of the submandibular glands. No ductal dilatation. Previously seen fluid density structure near the right sublingual gland has resolved. Thyroid: Enlarged and heterogeneous left thyroid lobe. Lymph nodes: None enlarged or abnormal density. Numerous bilateral subcentimeter nodes have progressed slightly since 07/16/2021. Vascular: Negative. Limited intracranial: Negative. Visualized orbits: Negative. Mastoids and visualized paranasal sinuses: Moderate ethmoid, maxillary and sphenoid sinus mucosal thickening. Mastoids are clear. Skeleton: No acute or aggressive process. Upper chest: Negative. Other: None. IMPRESSION: 1. Both parotid glands are  enlarged and heterogeneous with numerous calcifications. Atrophic appearance of the submandibular glands. Findings are consistent with chronic inflammatory disease of the salivary glands, such as Sjogren disease. 2. Incidental heterogeneous and enlarged thyroid. Recommend non-emergent thyroid ultrasound. Reference: J Am Coll Radiol. 2015 Feb;12(2): 143-50 3. Numerous subcentimeter bilateral cervical lymph nodes, progressed from 07/16/2021. 4. Moderate paranasal sinus disease. Electronically Signed   By: Ulyses Jarred M.D.   On: 05/07/2022 00:40    Microbiology: Results for orders placed or performed during the hospital encounter of 05/06/22  Resp panel by RT-PCR (RSV, Flu A&B, Covid) Anterior Nasal Swab     Status: None   Collection Time: 05/06/22  1:49 PM   Specimen: Anterior Nasal Swab  Result Value Ref Range Status   SARS Coronavirus 2 by RT PCR NEGATIVE NEGATIVE Final    Comment: (NOTE) SARS-CoV-2 target nucleic acids are NOT DETECTED.  The SARS-CoV-2 RNA is generally detectable in upper respiratory specimens during the acute phase of infection. The lowest concentration of SARS-CoV-2 viral copies this assay can detect is 138 copies/mL. A negative result does not preclude SARS-Cov-2 infection and should not be used as the sole basis for treatment or other patient management decisions. A negative result may occur with  improper specimen collection/handling, submission of specimen other than nasopharyngeal swab, presence of viral mutation(s) within the areas targeted by this assay, and inadequate number of viral copies(<138 copies/mL). A negative result must be combined with clinical observations, patient history, and epidemiological information. The expected result is Negative.  Fact Sheet for Patients:  EntrepreneurPulse.com.au  Fact Sheet for Healthcare Providers:  IncredibleEmployment.be  This test is no t yet approved or cleared by the [place] FDA and  has been authorized for detection and/or diagnosis of SARS-CoV-2 by FDA under an Emergency Use Authorization (EUA). This EUA will remain  in effect (meaning this test can be used) for the duration of the COVID-19 declaration under Section 564(b)(1) of the Act, 21 U.S.C.section 360bbb-3(b)(1), unless the authorization is terminated  or revoked sooner.       Influenza A by PCR NEGATIVE NEGATIVE Final   Influenza B by PCR NEGATIVE NEGATIVE Final    Comment: (NOTE) The Xpert Xpress SARS-CoV-2/FLU/RSV plus assay is intended as an aid in the diagnosis of influenza from Nasopharyngeal swab specimens and should not be used as a sole basis for treatment. Nasal washings and aspirates are unacceptable for Xpert Xpress SARS-CoV-2/FLU/RSV testing.  Fact Sheet for Patients: EntrepreneurPulse.com.au  Fact Sheet for Healthcare Providers: IncredibleEmployment.be  This test is not yet approved or cleared by the [place] FDA and has been authorized for detection and/or diagnosis of SARS-CoV-2 by FDA under an Emergency Use Authorization (EUA). This EUA will remain in effect (meaning this test can be used) for the duration of the COVID-19 declaration under Section 564(b)(1) of the Act, 21 U.S.C. section 360bbb-3(b)(1), unless the authorization is terminated or revoked.     Resp Syncytial Virus by PCR NEGATIVE NEGATIVE Final    Comment: (NOTE)  Fact Sheet for Patients: EntrepreneurPulse.com.au  Fact Sheet for Healthcare Providers: IncredibleEmployment.be  This test is not yet approved or cleared by the [place] FDA and has been authorized for detection and/or diagnosis of SARS-CoV-2 by FDA under an Emergency Use Authorization (EUA). This EUA will remain in effect (meaning this test can be used) for the duration of the COVID-19 declaration under Section 564(b)(1) of the Act, 21 U.S.C. section  360bbb-3(b)(1), unless the authorization is terminated or revoked.  Performed at [facility], [address] [place], [place] [address]   Group A Strep by PCR     Status: Abnormal   Collection Time: 05/06/22  7:32 PM   Specimen: Urine, Clean Catch; Sterile Swab  Result Value Ref Range Status   Group A Strep by PCR DETECTED (A) NOT DETECTED Final    Comment: Performed at [facility] [address], [place], [place] [address]  Culture, blood (single)     Status: None (Preliminary result)   Collection Time: 05/07/22  1:23 AM   Specimen: BLOOD RIGHT HAND  Result Value Ref Range Status   Specimen Description BLOOD RIGHT HAND  Final   Special Requests   Final    BOTTLES DRAWN AEROBIC AND ANAEROBIC Blood Culture adequate volume   Culture   Final    NO GROWTH 2 DAYS Performed at [facility], [address], [place], [place] [address]    Report Status PENDING  Incomplete    Labs: CBC: Recent Labs  Lab 05/06/22 2136 05/08/22 0321 05/09/22 0301  WBC 10.7* 9.8 6.9  NEUTROABS 8.6*  --   --   HGB 11.6* 10.0* 10.1*  HCT 36.6 30.8* 32.0*  MCV 84.1 83.5 85.1  PLT 296 270 944   Basic Metabolic Panel: Recent Labs  Lab 05/06/22 2136 05/08/22 0321 05/09/22 0301  NA 135 137 138  K 3.5 3.6 3.5  CL 104 111 110  CO2 23 21* 23  GLUCOSE 89 100* 84  BUN '11 16 19  '$ CREATININE 0.77 0.73 0.82  CALCIUM 8.7* 8.3* 8.2*  MG  --  1.9  --   PHOS  --  3.2  --    Liver Function Tests: Recent Labs  Lab 05/08/22 0321 05/09/22 0301  AST 17 20  ALT 11 12  ALKPHOS 49 47  BILITOT 0.5 0.6  PROT 6.7 6.4*  ALBUMIN 2.5* 2.4*   CBG: No results for input(s): "GLUCAP" in the last 168 hours.  Discharge time spent: greater than 30 minutes.  Signed: Deatra James, MD [facility] 05/09/2022

## 2022-05-12 LAB — CULTURE, BLOOD (SINGLE)
Culture: NO GROWTH
Special Requests: ADEQUATE

## 2022-09-04 ENCOUNTER — Other Ambulatory Visit (HOSPITAL_COMMUNITY): Payer: Medicare Other

## 2022-09-04 ENCOUNTER — Inpatient Hospital Stay: Payer: Medicare Other | Attending: Hematology

## 2022-09-04 DIAGNOSIS — Z79811 Long term (current) use of aromatase inhibitors: Secondary | ICD-10-CM | POA: Insufficient documentation

## 2022-09-04 DIAGNOSIS — Z170 Estrogen receptor positive status [ER+]: Secondary | ICD-10-CM | POA: Diagnosis not present

## 2022-09-04 DIAGNOSIS — C50912 Malignant neoplasm of unspecified site of left female breast: Secondary | ICD-10-CM | POA: Diagnosis present

## 2022-09-04 DIAGNOSIS — Z9012 Acquired absence of left breast and nipple: Secondary | ICD-10-CM | POA: Diagnosis not present

## 2022-09-04 DIAGNOSIS — E559 Vitamin D deficiency, unspecified: Secondary | ICD-10-CM

## 2022-09-04 LAB — COMPREHENSIVE METABOLIC PANEL
ALT: 19 U/L (ref 0–44)
AST: 28 U/L (ref 15–41)
Albumin: 4.1 g/dL (ref 3.5–5.0)
Alkaline Phosphatase: 62 U/L (ref 38–126)
Anion gap: 9 (ref 5–15)
BUN: 11 mg/dL (ref 6–20)
CO2: 22 mmol/L (ref 22–32)
Calcium: 9 mg/dL (ref 8.9–10.3)
Chloride: 104 mmol/L (ref 98–111)
Creatinine, Ser: 0.97 mg/dL (ref 0.44–1.00)
GFR, Estimated: >60 mL/min (ref >60)
Glucose, Bld: 108 mg/dL — ABNORMAL HIGH (ref 70–99)
Potassium: 3.6 mmol/L (ref 3.5–5.1)
Sodium: 135 mmol/L (ref 135–145)
Total Bilirubin: 0.8 mg/dL (ref 0.3–1.2)
Total Protein: 9 g/dL — ABNORMAL HIGH (ref 6.5–8.1)

## 2022-09-04 LAB — CBC WITH DIFFERENTIAL/PLATELET
Abs Immature Granulocytes: 0.01 10*3/uL (ref 0.00–0.07)
Basophils Absolute: 0 10*3/uL (ref 0.0–0.1)
Basophils Relative: 1 %
Eosinophils Absolute: 0 10*3/uL (ref 0.0–0.5)
Eosinophils Relative: 1 %
HCT: 41.3 % (ref 36.0–46.0)
Hemoglobin: 13.3 g/dL (ref 12.0–15.0)
Immature Granulocytes: 0 %
Lymphocytes Relative: 33 %
Lymphs Abs: 1.2 10*3/uL (ref 0.7–4.0)
MCH: 26.9 pg (ref 26.0–34.0)
MCHC: 32.2 g/dL (ref 30.0–36.0)
MCV: 83.4 fL (ref 80.0–100.0)
Monocytes Absolute: 0.3 10*3/uL (ref 0.1–1.0)
Monocytes Relative: 7 %
Neutro Abs: 2.2 10*3/uL (ref 1.7–7.7)
Neutrophils Relative %: 58 %
Platelets: 215 10*3/uL (ref 150–400)
RBC: 4.95 MIL/uL (ref 3.87–5.11)
RDW: 13.6 % (ref 11.5–15.5)
WBC: 3.7 10*3/uL — ABNORMAL LOW (ref 4.0–10.5)
nRBC: 0 % (ref 0.0–0.2)

## 2022-09-04 LAB — VITAMIN D 25 HYDROXY (VIT D DEFICIENCY, FRACTURES): Vit D, 25-Hydroxy: 39.44 ng/mL (ref 30–100)

## 2022-09-06 LAB — CANCER ANTIGEN 15-3: CA 15-3: 25.1 U/mL — ABNORMAL HIGH (ref 0.0–25.0)

## 2022-09-11 ENCOUNTER — Inpatient Hospital Stay: Payer: Medicare Other | Admitting: Hematology

## 2022-09-11 ENCOUNTER — Inpatient Hospital Stay (HOSPITAL_BASED_OUTPATIENT_CLINIC_OR_DEPARTMENT_OTHER): Payer: Medicare Other | Admitting: Physician Assistant

## 2022-09-11 VITALS — BP 121/81 | HR 57 | Temp 98.9°F | Resp 18 | Wt 122.0 lb

## 2022-09-11 DIAGNOSIS — Z170 Estrogen receptor positive status [ER+]: Secondary | ICD-10-CM | POA: Diagnosis not present

## 2022-09-11 DIAGNOSIS — C50912 Malignant neoplasm of unspecified site of left female breast: Secondary | ICD-10-CM

## 2022-09-11 MED ORDER — IBUPROFEN 600 MG PO TABS: 600.0000 mg | ORAL_TABLET | Freq: Three times a day (TID) | ORAL | 0 refills | Status: DC

## 2022-09-11 MED ORDER — EXEMESTANE 25 MG PO TABS: ORAL_TABLET | ORAL | 6 refills | Status: DC

## 2022-09-11 NOTE — Progress Notes (Signed)
[facility] [address] [place] [address]   Patient Care Team: Patient, No Pcp Per as PCP - General (General Practice) Therese Sarah, RN as Oncology Nurse Navigator (Oncology)  SUMMARY OF ONCOLOGIC HISTORY: Oncology History  Breast cancer, stage 1, estrogen receptor positive, left (HCC)  03/15/2020 Initial Diagnosis   Breast cancer, stage 1, estrogen receptor positive, left (HCC)   03/15/2020 Cancer Staging   Staging form: Breast, AJCC 8th Edition - Clinical stage from 03/15/2020: Stage IA (cT1c, cN0, cM0, G2, ER+, PR+, HER2-) - Signed by Doreatha Massed, MD on 03/15/2020    Genetic Testing   Negative genetic testing on the CancerNext panel test.  The CancerNext gene panel offered by [employer] includes sequencing and rearrangement analysis for the following 34 genes:   APC, ATM, BARD1, BMPR1A, BRCA1, BRCA2, BRIP1, CDH1, CDK4, CDKN2A, CHEK2, DICER1, HOXB13, EPCAM, GREM1, MLH1, MRE11A, MSH2, MSH6, MUTYH, NBN, NF1, PALB2, PMS2, POLD1, POLE, PTEN, RAD50, RAD51C, RAD51D, SMAD4, SMARCA4, STK11, and TP53.  The report date is Sep 11, 2015.  Tested thorugh the [facility].     CHIEF COMPLIANT: Follow-up for left breast cancer   INTERVAL HISTORY: April Acevedo is a 47 y.o. female seen for follow-up of left breast cancer.  She is tolerating exemestane very well.  She reports energy and appetite are stable. She is able to complete her ADLs on her own. She reports new onset of shooting pain around her left axilla. She did not palpable any new masses or lymph nodes. She denies nausea, vomiting or abdominal pain. Her bowel habits are regular. She denies easy bruising or signs of bleeding. She denies fevers, chills, sweats, shortness of breath, chest pain or cough. She has no other complaints. Rest of the ROS is below.   REVIEW OF SYSTEMS:   Review of Systems  Constitutional:  Negative for appetite change and fatigue.  HENT:    Negative for lump/mass.   Respiratory:  Negative for cough and shortness of breath.   Cardiovascular:  Negative for chest pain and leg swelling.  Gastrointestinal:  Negative for abdominal pain, blood in stool, constipation, diarrhea, nausea and vomiting.  Endocrine: Negative for hot flashes.  Neurological:  Negative for dizziness and headaches.  All other systems reviewed and are negative.   I have reviewed the past medical history, past surgical history, social history and family history with the patient and they are unchanged from previous note.   ALLERGIES:   has No Known Allergies.   MEDICATIONS:  Current Outpatient Medications  Medication Sig Dispense Refill   alum & mag hydroxide-simeth (MAALOX/MYLANTA) 200-200-20 MG/5ML suspension Take 30 mLs by mouth every 4 (four) hours. 355 mL 0   HYDROcodone-acetaminophen (NORCO/VICODIN) 5-325 MG tablet Take 1 tablet by mouth every 4 (four) hours as needed for severe pain. 6 tablet 0   methylPREDNISolone (MEDROL DOSEPAK) 4 MG TBPK tablet Medrol Dosepak take as instructed 21 tablet 0   exemestane (AROMASIN) 25 MG tablet TAKE 1 TABLET(25 MG) BY MOUTH DAILY AFTER BREAKFAST 30 tablet 6   ibuprofen (ADVIL) 600 MG tablet Take 1 tablet (600 mg total) by mouth 3 (three) times daily. 15 tablet 0   No current facility-administered medications for this visit.     PHYSICAL EXAMINATION: Performance status (ECOG): 0 - Asymptomatic  Vitals:   09/11/22 1355  BP: 121/81  Pulse: (!) 57  Resp: 18  Temp: 98.9 F (37.2 C)  SpO2: 100%   Wt Readings from Last  3 Encounters:  09/11/22 122 lb (55.3 kg)  05/06/22 129 lb (58.5 kg)  03/06/22 130 lb 4.8 oz (59.1 kg)   Physical Exam Vitals reviewed.  Constitutional:      Appearance: Normal appearance.  Cardiovascular:     Rate and Rhythm: Normal rate and regular rhythm.     Pulses: Normal pulses.     Heart sounds: Normal heart sounds.  Pulmonary:     Effort: Pulmonary effort is normal.     Breath  sounds: Normal breath sounds.  Chest:  Breasts:    Right: No swelling, bleeding, inverted nipple, mass, nipple discharge, skin change (reconstructed breat WNL) or tenderness.     Left: No swelling, bleeding, inverted nipple, mass, nipple discharge, skin change (reconstructed breast WNL) or tenderness.  Lymphadenopathy:     Upper Body:     Right upper body: Axillary adenopathy (sub cm) present. No supraclavicular adenopathy.     Left upper body: Axillary adenopathy (sub cm) present. No supraclavicular adenopathy.  Neurological:     General: No focal deficit present.     Mental Status: She is alert and oriented to person, place, and time.  Psychiatric:        Mood and Affect: Mood normal.        Behavior: Behavior normal.     LABORATORY DATA:  I have reviewed the data as listed    Latest Ref Rng & Units 09/04/2022    2:32 PM 05/09/2022    3:01 AM 05/08/2022    3:21 AM  CMP  Glucose 70 - 99 mg/dL 782  84  956   BUN 6 - 20 mg/dL 11  19  16    Creatinine 0.44 - 1.00 mg/dL 2.13  0.86  5.78   Sodium 135 - 145 mmol/L 135  138  137   Potassium 3.5 - 5.1 mmol/L 3.6  3.5  3.6   Chloride 98 - 111 mmol/L 104  110  111   CO2 22 - 32 mmol/L 22  23  21    Calcium 8.9 - 10.3 mg/dL 9.0  8.2  8.3   Total Protein 6.5 - 8.1 g/dL 9.0  6.4  6.7   Total Bilirubin 0.3 - 1.2 mg/dL 0.8  0.6  0.5   Alkaline Phos 38 - 126 U/L 62  47  49   AST 15 - 41 U/L 28  20  17    ALT 0 - 44 U/L 19  12  11     Lab Results  Component Value Date   CAN153 25.1 (H) 09/04/2022   CAN153 23.0 02/27/2022   CAN153 24.5 07/25/2021   Lab Results  Component Value Date   WBC 3.7 (L) 09/04/2022   HGB 13.3 09/04/2022   HCT 41.3 09/04/2022   MCV 83.4 09/04/2022   PLT 215 09/04/2022   NEUTROABS 2.2 09/04/2022    ASSESSMENT:  1.  T1CN0 left breast IDC, ER/PR positive, HER-2 negative: -Bilateral nipple sparing mastectomy on 10/12/2015 in [facility] [place]. -Pathology showing 1.8 cm IDC, margins negative, grade  2, ER/PR positive, HER-2 negative, PT1CPN0. -Oncotype DX recurrence score of 21 -Treated with tamoxifen for a year, Lupron was added during year 2, tamoxifen continued for a total of 3 years. -Tamoxifen changed to Aromasin, Lupron monthly continued. -She recently moved to [place].  She did not receive Lupron injection for 2 months. -Genetic testing was negative for BRCA 1 and 2 -[place]09/[place] showed left axillary lymph node. -She was recently hospitalized for  syncopal episode.  CT angio of the head and neck on 02/18/2020 showed 3 mm left upper lobe lung nodule. -Brain MRI on 02/19/2020 without evidence of metastatic disease. - Salpingo-oophorectomy in March 2022.   2.  Social/family history: -She worked as Public house manager previously.  She is non-smoker.  She lives with her son. -Paternal uncle had prostate cancer.  Paternal grandmother died of colon cancer at age 34.  Maternal cousin had ovarian cancer.   3.  Bone health: -DEXA scan on 04/26/2019 with T score of -1.6.   PLAN:  1.  T1CN0 left breast cancer: - She is tolerating Aromasin reasonably well. Recommend to continue.  --Labs from 09/04/2022 showed WBC 3.7, Hgb 13.3, Plt 215. Creatinine and LFTs are normal. CA15-3 tumor marker was slightly above normal at 25.1. Will repeat today.  - Physical exam today did not reveal any palpable masses at the implant sites.  Very small stable axillary lymph nodes. --Due to pain in the right axillary region, we will obtain axillary [place] to further evaluate.  - RTC 6 months for follow-up with repeat labs and exam.  She will take Aromasin for 10 years.   2.  Bone health: - Continue calcium and vitamin D supplements.  Vitamin D level from 09/04/2022 was normal at 39.44.  - Bone density scan was ordered but never done, will schedule for this week.    3.  Left lung nodule: - CT chest without contrast on 01/14/2021: Few scattered small lung nodules throughout both lungs, largest 4 mm. - CT  angio chest on 10/01/2021 without any evidence of lung nodules or masses.  No pulmonary embolus.  4.  Possible rheumatoid arthritis: - She has joint pains in the small joints of the hands.  Continue follow-up with rheumatology.  Breast Cancer therapy associated bone loss: I have recommended calcium, Vitamin D and weight bearing exercises.  Orders placed this encounter:  Orders Placed This Encounter  Procedures   [place] AXILLA LEFT   [place] AXILLA RIGHT   Cancer antigen 15-3     The patient has a good understanding of the overall plan. She agrees with it. She will call with any problems that may develop before the next visit here.  I have spent a total of 30 minutes minutes of face-to-face and non-face-to-face time, preparing to see the patient, performing a medically appropriate examination, counseling and educating the patient, ordering tests/procedures,  documenting clinical information in the electronic health record, and care coordination.   Georga Kaufmann PA-C Dept of Hematology and Oncology [facility]

## 2022-09-13 LAB — CANCER ANTIGEN 15-3: CA 15-3: 23.9 U/mL (ref 0.0–25.0)

## 2022-09-15 NOTE — Progress Notes (Signed)
Patient made aware of results.  

## 2022-09-16 ENCOUNTER — Other Ambulatory Visit (HOSPITAL_COMMUNITY): Payer: Medicare Other

## 2022-09-16 ENCOUNTER — Encounter (HOSPITAL_COMMUNITY): Payer: Self-pay | Admitting: Hematology

## 2022-09-23 ENCOUNTER — Ambulatory Visit (HOSPITAL_COMMUNITY)
Admission: RE | Admit: 2022-09-23 | Discharge: 2022-09-23 | Disposition: A | Discharge status: Home / Self Care | Payer: Medicare Other | Source: Ambulatory Visit | Attending: Physician Assistant | Admitting: Physician Assistant

## 2022-09-23 ENCOUNTER — Encounter (HOSPITAL_COMMUNITY): Payer: Self-pay

## 2022-09-23 ENCOUNTER — Ambulatory Visit (HOSPITAL_COMMUNITY)
Admission: RE | Admit: 2022-09-23 | Discharge: 2022-09-23 | Disposition: A | Discharge status: Home / Self Care | Payer: Medicare Other | Source: Ambulatory Visit | Attending: Hematology | Admitting: Hematology

## 2022-09-23 ENCOUNTER — Other Ambulatory Visit (HOSPITAL_COMMUNITY): Payer: Medicare Other

## 2022-09-23 DIAGNOSIS — Z780 Asymptomatic menopausal state: Secondary | ICD-10-CM

## 2022-09-23 DIAGNOSIS — C50912 Malignant neoplasm of unspecified site of left female breast: Secondary | ICD-10-CM | POA: Diagnosis present

## 2022-09-23 DIAGNOSIS — Z170 Estrogen receptor positive status [ER+]: Secondary | ICD-10-CM | POA: Diagnosis present

## 2022-10-06 ENCOUNTER — Encounter: Payer: Self-pay | Admitting: Hematology

## 2022-10-06 ENCOUNTER — Inpatient Hospital Stay: Payer: Medicare Other | Attending: Hematology | Admitting: Hematology

## 2022-10-06 DIAGNOSIS — Z170 Estrogen receptor positive status [ER+]: Secondary | ICD-10-CM

## 2022-10-06 DIAGNOSIS — M8580 Other specified disorders of bone density and structure, unspecified site: Secondary | ICD-10-CM | POA: Insufficient documentation

## 2022-10-06 DIAGNOSIS — C50912 Malignant neoplasm of unspecified site of left female breast: Secondary | ICD-10-CM | POA: Diagnosis not present

## 2022-10-06 NOTE — Progress Notes (Signed)
Virtual Visit via Telephone Note  I connected with April Acevedo on 10/06/22 at  4:00 PM EDT by telephone and verified that I am speaking with the correct person using two identifiers.  Location: Patient: At home  Provider: In the office   I discussed the limitations, risks, security and privacy concerns of performing an evaluation and management service by telephone and the availability of in person appointments. I also discussed with the patient that there may be a patient responsible charge related to this service. The patient expressed understanding and agreed to proceed.   History of Present Illness: Patient seen in our office for left breast cancer.  She had bilateral nipple sparing mastectomy on 10/12/2015 with pathology showing 1.8 cm IDC, margins negative, grade 2, ER plus/PR positive and HER2 negative.   Observations/Objective: She denies any new onset pains.  At last visit, due to discomfort in the axillary region, ultrasound was done on both sides.  She also had a bone density test.  Assessment and Plan:  1.  T1c N0 left breast cancer: - Reviewed results of bilateral axillary ultrasound which was BI-RADS Category 1. - Reviewed results of tumor marker CA 15-3 which was normal at 23.9, down from 25.1. - Recommend continuing Aromasin for 10 years.  She will keep her appointment in 6 months.  2.  Osteopenia: - Discussed the bone density results from 09/23/2022 with T-score -1.4, osteopenia. - Continue calcium and vitamin D supplements.  Vitamin D level was normal at 39. - Plan on repeating bone density test in the next 2 to 3 years.     Follow Up Instructions: RTC 6 months as previously given.   I discussed the assessment and treatment plan with the patient. The patient was provided an opportunity to ask questions and all were answered. The patient agreed with the plan and demonstrated an understanding of the instructions.   The patient was advised to call back or seek  an in-person evaluation if the symptoms worsen or if the condition fails to improve as anticipated.  I provided 21 minutes of non-face-to-face time during this encounter.   Doreatha Massed, MD

## 2023-03-13 ENCOUNTER — Other Ambulatory Visit: Payer: Self-pay

## 2023-03-13 DIAGNOSIS — C50912 Malignant neoplasm of unspecified site of left female breast: Secondary | ICD-10-CM

## 2023-03-13 DIAGNOSIS — E559 Vitamin D deficiency, unspecified: Secondary | ICD-10-CM

## 2023-03-16 ENCOUNTER — Inpatient Hospital Stay: Payer: Medicare Other | Attending: Hematology

## 2023-03-23 ENCOUNTER — Inpatient Hospital Stay: Payer: Medicare Other | Admitting: Physician Assistant

## 2023-05-06 ENCOUNTER — Other Ambulatory Visit: Payer: Self-pay

## 2023-05-06 ENCOUNTER — Emergency Department (HOSPITAL_COMMUNITY): Payer: Medicare Other

## 2023-05-06 ENCOUNTER — Emergency Department (HOSPITAL_COMMUNITY)
Admission: EM | Admit: 2023-05-06 | Discharge: 2023-05-07 | Disposition: A | Discharge status: Home / Self Care | Payer: Medicare Other | Attending: Emergency Medicine | Admitting: Emergency Medicine

## 2023-05-06 ENCOUNTER — Encounter (HOSPITAL_COMMUNITY): Payer: Self-pay

## 2023-05-06 DIAGNOSIS — C50919 Malignant neoplasm of unspecified site of unspecified female breast: Secondary | ICD-10-CM | POA: Diagnosis not present

## 2023-05-06 DIAGNOSIS — R0981 Nasal congestion: Secondary | ICD-10-CM | POA: Diagnosis present

## 2023-05-06 DIAGNOSIS — K1120 Sialoadenitis, unspecified: Secondary | ICD-10-CM

## 2023-05-06 DIAGNOSIS — J019 Acute sinusitis, unspecified: Secondary | ICD-10-CM | POA: Diagnosis not present

## 2023-05-06 DIAGNOSIS — J329 Chronic sinusitis, unspecified: Secondary | ICD-10-CM

## 2023-05-06 LAB — CBC WITH DIFFERENTIAL/PLATELET
Abs Immature Granulocytes: 0.02 10*3/uL (ref 0.00–0.07)
Basophils Absolute: 0 10*3/uL (ref 0.0–0.1)
Basophils Relative: 0 %
Eosinophils Absolute: 0.1 10*3/uL (ref 0.0–0.5)
Eosinophils Relative: 1 %
HCT: 36.2 % (ref 36.0–46.0)
Hemoglobin: 11.5 g/dL — ABNORMAL LOW (ref 12.0–15.0)
Immature Granulocytes: 0 %
Lymphocytes Relative: 20 %
Lymphs Abs: 1.6 10*3/uL (ref 0.7–4.0)
MCH: 27.8 pg (ref 26.0–34.0)
MCHC: 31.8 g/dL (ref 30.0–36.0)
MCV: 87.7 fL (ref 80.0–100.0)
Monocytes Absolute: 0.5 10*3/uL (ref 0.1–1.0)
Monocytes Relative: 6 %
Neutro Abs: 6 10*3/uL (ref 1.7–7.7)
Neutrophils Relative %: 73 %
Platelets: 252 10*3/uL (ref 150–400)
RBC: 4.13 MIL/uL (ref 3.87–5.11)
RDW: 12.1 % (ref 11.5–15.5)
WBC: 8.2 10*3/uL (ref 4.0–10.5)
nRBC: 0 % (ref 0.0–0.2)

## 2023-05-06 LAB — COMPREHENSIVE METABOLIC PANEL
ALT: 11 U/L (ref 0–44)
AST: 19 U/L (ref 15–41)
Albumin: 3.3 g/dL — ABNORMAL LOW (ref 3.5–5.0)
Alkaline Phosphatase: 59 U/L (ref 38–126)
Anion gap: 8 (ref 5–15)
BUN: 11 mg/dL (ref 6–20)
CO2: 22 mmol/L (ref 22–32)
Calcium: 9 mg/dL (ref 8.9–10.3)
Chloride: 106 mmol/L (ref 98–111)
Creatinine, Ser: 0.88 mg/dL (ref 0.44–1.00)
GFR, Estimated: >60 mL/min (ref >60)
Glucose, Bld: 120 mg/dL — ABNORMAL HIGH (ref 70–99)
Potassium: 3.2 mmol/L — ABNORMAL LOW (ref 3.5–5.1)
Sodium: 136 mmol/L (ref 135–145)
Total Bilirubin: 0.6 mg/dL (ref 0.0–1.2)
Total Protein: 8.7 g/dL — ABNORMAL HIGH (ref 6.5–8.1)

## 2023-05-06 LAB — LACTIC ACID, PLASMA: Lactic Acid, Venous: 1.2 mmol/L (ref 0.5–1.9)

## 2023-05-06 MED ORDER — IOHEXOL 300 MG/ML  SOLN
75.0000 mL | Freq: Once | INTRAMUSCULAR | Status: AC | PRN
  Administered 2023-05-06: 75 mL via INTRAVENOUS

## 2023-05-06 MED ORDER — KETOROLAC TROMETHAMINE 30 MG/ML IJ SOLN
15.0000 mg | Freq: Once | INTRAMUSCULAR | Status: AC
  Administered 2023-05-06: 15 mg via INTRAVENOUS
  Filled 2023-05-06: qty 1

## 2023-05-06 MED ORDER — SODIUM CHLORIDE 0.9 % IV SOLN
3.0000 g | Freq: Once | INTRAVENOUS | Status: AC
  Administered 2023-05-06: 3 g via INTRAVENOUS
  Filled 2023-05-06: qty 8

## 2023-05-06 NOTE — Progress Notes (Signed)
 ED Pharmacy Antibiotic Sign Off An antibiotic consult was received from an ED provider for Unasyn  per pharmacy dosing for parapharyngeal infection. A chart review was completed to assess appropriateness.   The following one time order(s) were placed:   -Unasyn  3g IV x1  Further antibiotic and/or antibiotic pharmacy consults should be ordered by the admitting provider if indicated.   Thank you for allowing pharmacy to be a part of this patient's care.   Lynwood Poplar, [facility]  Clinical Pharmacist 05/06/23 10:54 PM

## 2023-05-06 NOTE — ED Triage Notes (Signed)
 Pt reports she started out with minor cold symptoms 2 weeks ago and then it has progressed to eye drainage from both eyes, ear pain with swelling noted to jaw and neck area in front of the ears, more prominent on the right, and states her tongue is raw and sore.

## 2023-05-07 MED ORDER — AMOXICILLIN-POT CLAVULANATE 875-125 MG PO TABS: 1.0000 | ORAL_TABLET | Freq: Two times a day (BID) | ORAL | 0 refills | Status: DC

## 2023-05-07 NOTE — ED Provider Notes (Signed)
 [place] EMERGENCY DEPARTMENT AT [facility] Provider Note   CSN: [id number] Arrival date & time: 05/06/23  1625     History  Chief Complaint  Patient presents with   URI    April Acevedo is a 48 y.o. female.  With a history of breast cancer on chemotherapy and radiation who presents to the ED for sinus congestion.  She has experienced about 3 weeks of ongoing and progressive nasal congestion.  Started with runny nose and dry cough and now with significant sinus pressure, drainage from her eye and swelling over the angle of the mandible bilaterally.  She has not experienced fevers, chest pain or significant shortness of breath.  No nausea vomiting or other GI symptoms.   URI      Home Medications Prior to Admission medications   Medication Sig Start Date End Date Taking? Authorizing Provider  alum & mag hydroxide-simeth (MAALOX/MYLANTA) 200-200-20 MG/5ML suspension Take 30 mLs by mouth every 4 (four) hours. 05/09/22   Shahmehdi, Adriana LABOR, MD  exemestane  (AROMASIN ) 25 MG tablet TAKE 1 TABLET(25 MG) BY MOUTH DAILY AFTER BREAKFAST 09/11/22   Neomi Lis T, PA-C  HYDROcodone -acetaminophen  (NORCO/VICODIN) 5-325 MG tablet Take 1 tablet by mouth every 4 (four) hours as needed for severe pain. 05/06/22   Idol, Julie, PA-C  ibuprofen  (ADVIL ) 600 MG tablet Take 1 tablet (600 mg total) by mouth 3 (three) times daily. 09/11/22   Thayil, Irene T, PA-C  methylPREDNISolone  (MEDROL  DOSEPAK) 4 MG TBPK tablet Medrol  Dosepak take as instructed 05/09/22   Shahmehdi, Seyed A, MD      Allergies    Patient has no known allergies.    Review of Systems   Review of Systems  Physical Exam Updated Vital Signs BP 115/73   Pulse 85   Temp 98.9 F (37.2 C) (Oral)   Resp 18   Ht 5' 4 (1.626 m)   Wt 58.5 kg   SpO2 95%   BMI 22.14 kg/m  Physical Exam Vitals and nursing note reviewed.  HENT:     Head: Normocephalic and atraumatic.     Mouth/Throat:     Pharynx: No oropharyngeal exudate  or posterior oropharyngeal erythema.     Comments: Submandibular swelling at the angle of the mandible right greater than left No swelling of the floor of the mouth Patent airway with uvula midline Speaking full sentences Frontal sinus tenderness to palpation Eyes:     Pupils: Pupils are equal, round, and reactive to light.  Cardiovascular:     Rate and Rhythm: Normal rate and regular rhythm.  Pulmonary:     Effort: Pulmonary effort is normal.     Breath sounds: Normal breath sounds.  Abdominal:     Palpations: Abdomen is soft.     Tenderness: There is no abdominal tenderness.  Skin:    General: Skin is warm and dry.  Neurological:     Mental Status: She is alert.  Psychiatric:        Mood and Affect: Mood normal.     ED Results / Procedures / Treatments   Labs (all labs ordered are listed, but only abnormal results are displayed) Labs Reviewed  COMPREHENSIVE METABOLIC PANEL - Abnormal; Notable for the following components:      Result Value   Potassium 3.2 (*)    Glucose, Bld 120 (*)    Total Protein 8.7 (*)    Albumin 3.3 (*)    All other components within normal limits  CBC WITH DIFFERENTIAL/PLATELET -  Abnormal; Notable for the following components:   Hemoglobin 11.5 (*)    All other components within normal limits  CULTURE, BLOOD (ROUTINE X 2)  CULTURE, BLOOD (ROUTINE X 2)  LACTIC ACID, PLASMA    EKG None  Radiology CT Soft Tissue Neck W Contrast Result Date: 05/07/2023 CLINICAL DATA:  ?parapharyngeal infection, bilateral mandibular swelling R > L EXAM: CT NECK WITH CONTRAST TECHNIQUE: Multidetector CT imaging of the neck was performed using the standard protocol following the bolus administration of intravenous contrast. RADIATION DOSE REDUCTION: This exam was performed according to the departmental dose-optimization program which includes automated exposure control, adjustment of the mA and/or kV according to patient size and/or use of iterative reconstruction  technique. CONTRAST:  75mL OMNIPAQUE  IOHEXOL  300 MG/ML  SOLN COMPARISON:  None Available. FINDINGS: Pharynx and larynx: Normal. No mass or swelling. Salivary glands: Chronic enlarged and heterogeneous submandibular and parotid glands with numerous parotid calcifications. No new or asymmetric edema. Thyroid: Enlarged in heterogeneous thyroid. Lymph nodes: Chronically prominent subcentimeter lymph nodes throughout the cervical chain bilaterally, similar. Vascular: Negative. Limited assessment due to non arterial contrast timing. Limited intracranial: Negative. Visualized orbits: Negative. Mastoids and visualized paranasal sinuses: Severe paranasal sinus mucosal thickening. Bilateral mastoid effusions. Skeleton: No acute abnormality on limited assessment. Upper chest: Biapical pleuroparenchymal scarring. Otherwise, visualized lung apices are clear. IMPRESSION: 1. Chronic enlarged and heterogeneous submandibular and parotid glands with numerous parotid calcifications, suggestive of a chronic inflammatory process such as Sjogren's disease. No new or asymmetric edema to suggest an acute process by CT. No discrete drainable fluid collection. 2. Chronically prominent subcentimeter lymph nodes throughout the cervical chain bilaterally, similar. 3. Severe bilateral paranasal sinus mucosal thickening and bilateral mastoid effusions. Correlate with the presence or absence of signs/symptoms of sinusitis or mastoiditis. 4. Enlarged heterogeneous thyroid. Recommend thyroid US  (ref: J Am Coll Radiol. 2015 Feb;12(2): 143-50). Electronically Signed   By: Gilmore GORMAN Molt M.D.   On: 05/07/2023 00:34    Procedures Procedures    Medications Ordered in ED Medications  ketorolac  (TORADOL ) 30 MG/ML injection 15 mg (15 mg Intravenous Given 05/06/23 2312)  Ampicillin -Sulbactam (UNASYN ) 3 g in sodium chloride  0.9 % 100 mL IVPB (0 g Intravenous Stopped 05/06/23 2349)  iohexol  (OMNIPAQUE ) 300 MG/ML solution 75 mL (75 mLs Intravenous  Contrast Given 05/06/23 2358)    ED Course/ Medical Decision Making/ A&P Clinical Course as of 05/07/23 0059  Thu May 07, 2023  0010 LILLETTE Ozell Marine DO, am transitioning care of this patient to the oncoming provider pending CT, reevaluation and disposition [MP]    Clinical Course User Index [MP] Marine Ozell LABOR, DO                                 Medical Decision Making 48 year old female with history as above presenting for 3 weeks of worsening sinus congestion now with submandibular swelling.  No concern for airway compromise.  Differential diagnosis includes viral respiratory infection, sinus infection and parapharyngeal (deep space) infection.  Will obtain laboratory workup including CBC, BMP and CT soft tissue neck to look for deeper infection.  Will cover with Unasyn  here.  If a deeper infection is present we may need to get ENT involved and continue IV antibiotics and admission.  If there is no evidence of deeper infection plan on treating with outpatient antibiotics for sinus infection so long as she remains stable  Amount and/or Complexity of Data Reviewed Labs: ordered.  Radiology: ordered.  Risk Prescription drug management.           Final Clinical Impression(s) / ED Diagnoses Final diagnoses:  Acute sinusitis, recurrence not specified, unspecified location    Rx / DC Orders ED Discharge Orders     None         Pamella Ozell LABOR, DO 05/07/23 [phone]

## 2023-05-07 NOTE — ED Provider Notes (Signed)
 Multiple findings noted some appear chronic, does have sinusitis, potentially parotitis as well.  Patient is awake alert in no acute distress. No stridor, no drooling, no angioedema. No boggy mastoid or mastoid tenderness Plan for discharge home. Will start Augmentin  and refer to otolaryngology, patient agreeable with plan   Midge Golas, MD 05/07/23 [phone]

## 2023-05-07 NOTE — ED Notes (Signed)
 ED Provider at bedside.

## 2023-05-08 ENCOUNTER — Other Ambulatory Visit: Payer: Self-pay | Admitting: Physician Assistant

## 2023-05-11 ENCOUNTER — Encounter (HOSPITAL_COMMUNITY): Payer: Self-pay | Admitting: Hematology

## 2023-05-11 LAB — CULTURE, BLOOD (ROUTINE X 2)
Culture: NO GROWTH
Culture: NO GROWTH
Special Requests: ADEQUATE
Special Requests: ADEQUATE

## 2023-10-26 ENCOUNTER — Encounter (HOSPITAL_COMMUNITY): Payer: Self-pay | Admitting: Hematology

## 2023-11-05 ENCOUNTER — Encounter (HOSPITAL_COMMUNITY): Payer: Self-pay | Admitting: Hematology

## 2023-11-12 ENCOUNTER — Inpatient Hospital Stay

## 2023-11-18 ENCOUNTER — Other Ambulatory Visit: Payer: Self-pay | Admitting: Hematology

## 2023-11-19 ENCOUNTER — Inpatient Hospital Stay: Admitting: Oncology

## 2024-04-14 ENCOUNTER — Ambulatory Visit: Admitting: Family Medicine

## 2024-05-03 ENCOUNTER — Other Ambulatory Visit: Payer: Self-pay

## 2024-05-03 DIAGNOSIS — E559 Vitamin D deficiency, unspecified: Secondary | ICD-10-CM

## 2024-05-03 DIAGNOSIS — C50912 Malignant neoplasm of unspecified site of left female breast: Secondary | ICD-10-CM

## 2024-05-03 DIAGNOSIS — M8580 Other specified disorders of bone density and structure, unspecified site: Secondary | ICD-10-CM

## 2024-05-04 ENCOUNTER — Inpatient Hospital Stay: Attending: Oncology

## 2024-05-04 DIAGNOSIS — C50912 Malignant neoplasm of unspecified site of left female breast: Secondary | ICD-10-CM | POA: Insufficient documentation

## 2024-05-04 DIAGNOSIS — Z170 Estrogen receptor positive status [ER+]: Secondary | ICD-10-CM | POA: Insufficient documentation

## 2024-05-04 DIAGNOSIS — M8580 Other specified disorders of bone density and structure, unspecified site: Secondary | ICD-10-CM | POA: Insufficient documentation

## 2024-05-04 DIAGNOSIS — E559 Vitamin D deficiency, unspecified: Secondary | ICD-10-CM | POA: Diagnosis not present

## 2024-05-04 LAB — COMPREHENSIVE METABOLIC PANEL WITH GFR
ALT: 13 U/L (ref 0–44)
AST: 31 U/L (ref 15–41)
Albumin: 4.4 g/dL (ref 3.5–5.0)
Alkaline Phosphatase: 62 U/L (ref 38–126)
Anion gap: 6 (ref 5–15)
BUN: 12 mg/dL (ref 6–20)
CO2: 28 mmol/L (ref 22–32)
Calcium: 8.9 mg/dL (ref 8.9–10.3)
Chloride: 105 mmol/L (ref 98–111)
Creatinine, Ser: 0.96 mg/dL (ref 0.44–1.00)
GFR, Estimated: >60 mL/min
Glucose, Bld: 79 mg/dL (ref 70–99)
Potassium: 3.9 mmol/L (ref 3.5–5.1)
Sodium: 139 mmol/L (ref 135–145)
Total Bilirubin: 0.5 mg/dL (ref 0.0–1.2)
Total Protein: 8.8 g/dL — ABNORMAL HIGH (ref 6.5–8.1)

## 2024-05-04 LAB — CBC WITH DIFFERENTIAL/PLATELET
Abs Immature Granulocytes: 0 K/uL (ref 0.00–0.07)
Basophils Absolute: 0 K/uL (ref 0.0–0.1)
Basophils Relative: 1 %
Eosinophils Absolute: 0 K/uL (ref 0.0–0.5)
Eosinophils Relative: 1 %
HCT: 39.6 % (ref 36.0–46.0)
Hemoglobin: 12.5 g/dL (ref 12.0–15.0)
Immature Granulocytes: 0 %
Lymphocytes Relative: 37 %
Lymphs Abs: 1.2 K/uL (ref 0.7–4.0)
MCH: 27.2 pg (ref 26.0–34.0)
MCHC: 31.6 g/dL (ref 30.0–36.0)
MCV: 86.3 fL (ref 80.0–100.0)
Monocytes Absolute: 0.3 K/uL (ref 0.1–1.0)
Monocytes Relative: 11 %
Neutro Abs: 1.6 K/uL — ABNORMAL LOW (ref 1.7–7.7)
Neutrophils Relative %: 50 %
Platelets: 205 K/uL (ref 150–400)
RBC: 4.59 MIL/uL (ref 3.87–5.11)
RDW: 12.8 % (ref 11.5–15.5)
WBC: 3.2 K/uL — ABNORMAL LOW (ref 4.0–10.5)
nRBC: 0 % (ref 0.0–0.2)

## 2024-05-04 LAB — VITAMIN D 25 HYDROXY (VIT D DEFICIENCY, FRACTURES): Vit D, 25-Hydroxy: 42.5 ng/mL (ref 30–100)

## 2024-05-05 LAB — CANCER ANTIGEN 15-3: CA 15-3: 24.1 U/mL (ref 0.0–25.0)

## 2024-05-12 ENCOUNTER — Inpatient Hospital Stay: Admitting: Oncology

## 2024-05-19 ENCOUNTER — Inpatient Hospital Stay: Attending: Oncology | Admitting: Oncology

## 2024-05-19 VITALS — BP 117/80 | HR 63 | Temp 97.5°F | Resp 16 | Wt 128.7 lb

## 2024-05-19 DIAGNOSIS — Z79811 Long term (current) use of aromatase inhibitors: Secondary | ICD-10-CM | POA: Insufficient documentation

## 2024-05-19 DIAGNOSIS — E559 Vitamin D deficiency, unspecified: Secondary | ICD-10-CM | POA: Insufficient documentation

## 2024-05-19 DIAGNOSIS — Z1721 Progesterone receptor positive status: Secondary | ICD-10-CM | POA: Insufficient documentation

## 2024-05-19 DIAGNOSIS — D709 Neutropenia, unspecified: Secondary | ICD-10-CM | POA: Insufficient documentation

## 2024-05-19 DIAGNOSIS — Z1732 Human epidermal growth factor receptor 2 negative status: Secondary | ICD-10-CM | POA: Diagnosis not present

## 2024-05-19 DIAGNOSIS — D708 Other neutropenia: Secondary | ICD-10-CM | POA: Insufficient documentation

## 2024-05-19 DIAGNOSIS — Z170 Estrogen receptor positive status [ER+]: Secondary | ICD-10-CM | POA: Insufficient documentation

## 2024-05-19 DIAGNOSIS — M8580 Other specified disorders of bone density and structure, unspecified site: Secondary | ICD-10-CM | POA: Insufficient documentation

## 2024-05-19 DIAGNOSIS — C50912 Malignant neoplasm of unspecified site of left female breast: Secondary | ICD-10-CM | POA: Diagnosis not present

## 2024-05-19 NOTE — Assessment & Plan Note (Addendum)
-  She has had intermittent neutropenia over the last 2 years ranging from 2.7 to normal. -ANC has never been below 1. -The differentials for neutropenia include benign ethnic neutropenia, infectious etiology, nutritional etiology, inflammatory etiology, or medication.  Patient has history of RA which is likely contributing.  Previous workup includes negative viral studies back in January 2024.  She is currently not on any immunosuppressive medication for her RA.  She is only taking ibuprofen  800 mg daily.  Will check B12 and folate level at her next visit.  Could also add on ESR and CRP.  Assuming none of the studies show any concerning abnormalities, we will see the patient back in 6 months time to continue to monitor.

## 2024-05-19 NOTE — Progress Notes (Signed)
 "  [facility] OFFICE PROGRESS NOTE  Patient, No Pcp Per  ASSESSMENT & PLAN:    Assessment & Plan Breast cancer, stage 1, estrogen receptor positive, left (HCC) - Reviewed results of bilateral axillary ultrasound which was BI-RADS Category 1. - Reviewed results of tumor marker CA 15-3 which was normal at 24.1. - Recommend continuing Aromasin  for 10 years.  She will keep her appointment in 6 months. -Continue Aromasin  for total of 10 years. -Labs look stable. -Breast exam unremarkable.   Osteopenia, unspecified location -Discussed the bone density results from 09/23/2022 with T-score -1.4, osteopenia. - Continue calcium  and vitamin D  supplements.  Vitamin D  level was normal at 42.5. - Plan on repeating bone density test in the next 2 to 3 years. Other neutropenia -She has had intermittent neutropenia over the last 2 years ranging from 2.7 to normal. -ANC has never been below 1. -The differentials for neutropenia include benign ethnic neutropenia, infectious etiology, nutritional etiology, inflammatory etiology, or medication.  Patient has history of RA which is likely contributing.  Previous workup includes negative viral studies back in January 2024.  She is currently not on any immunosuppressive medication for her RA.  She is only taking ibuprofen  800 mg daily.  Will check B12 and folate level at her next visit.  Could also add on ESR and CRP.  Assuming none of the studies show any concerning abnormalities, we will see the patient back in 6 months time to continue to monitor.    Vitamin D  deficiency, unspecified Continue vitamin D  supplements. Vitamin D  level has normalized and is 42.5.  Orders Placed This Encounter  Procedures   Vitamin D  25 hydroxy    Standing Status:   Future    Expected Date:   11/16/2024    Expiration Date:   02/14/2025   Cancer antigen 15-3    Standing Status:   Future    Expected Date:   11/16/2024    Expiration Date:   02/14/2025    CBC with Differential    Standing Status:   Future    Expected Date:   11/16/2024    Expiration Date:   02/14/2025   Comprehensive metabolic panel with GFR    Standing Status:   Future    Expected Date:   11/16/2024    Expiration Date:   02/14/2025   Sedimentation rate    Standing Status:   Future    Expected Date:   11/16/2024    Expiration Date:   02/14/2025   C-reactive protein    Standing Status:   Future    Expected Date:   11/16/2024    Expiration Date:   02/14/2025   Vitamin B12    Standing Status:   Future    Expected Date:   11/16/2024    Expiration Date:   02/14/2025   Methylmalonic acid, serum    Standing Status:   Future    Expected Date:   11/16/2024    Expiration Date:   02/14/2025   Folate    Standing Status:   Future    Expected Date:   11/16/2024    Expiration Date:   02/14/2025    INTERVAL HISTORY: Patient returns for follow-up.  Patient seen in our office for left breast cancer. She had bilateral nipple sparing mastectomy on 10/12/2015 with pathology showing 1.8 cm IDC, margins negative, grade 2, ER plus/PR positive and HER2 negative   She continues to have intermittent shooting pain in her left axilla.  Previously,  had ultrasounds bilaterally which were negative.  She also had a bone density scan which showed osteopenia.  She is currently on vitamin D  and calcium  supplements.  Patient recently had soft tissue neck for parapharyngeal infection and bilateral fibular swelling right greater than left which showed chronic enlarged and heterogeneous submandibular and parotid glands with numerous parotid calcifications suggestive of chronic inflammatory process such as Sjogren's disease.  No new or symmetric edema to suggest an acute process by CT.  Chronically prominent subcentimeter lymph nodes throughout the cervical chain bilaterally.  Severe bilateral paraspinous mucosal thickening and bilateral mastoid effusion.  Reports her energy levels are low.  Appetite is 100%  energy levels are 50%.  She denies any pain.  She has rotating constipation diarrhea nausea and vomiting.  Has acid reflux.  Has dizziness at times and trouble sleeping.  We reviewed vitamin D , CBC, CMP and cancer antigen 15-3.  SUMMARY OF HEMATOLOGIC HISTORY: Oncology History  Breast cancer, stage 1, estrogen receptor positive, left (HCC)  03/15/2020 Initial Diagnosis   Breast cancer, stage 1, estrogen receptor positive, left (HCC)   03/15/2020 Cancer Staging   Staging form: Breast, AJCC 8th Edition - Clinical stage from 03/15/2020: Stage IA (cT1c, cN0, cM0, G2, ER+, PR+, HER2-) - Signed by Rogers Hai, MD on 03/15/2020    Genetic Testing   Negative genetic testing on the CancerNext panel test.  The CancerNext gene panel offered by [employer] includes sequencing and rearrangement analysis for the following 34 genes:   APC, ATM, BARD1, BMPR1A, BRCA1, BRCA2, BRIP1, CDH1, CDK4, CDKN2A, CHEK2, DICER1, HOXB13, EPCAM, GREM1, MLH1, MRE11A, MSH2, MSH6, MUTYH, NBN, NF1, PALB2, PMS2, POLD1, POLE, PTEN, RAD50, RAD51C, RAD51D, SMAD4, SMARCA4, STK11, and TP53.  The report date is Sep 11, 2015.  Tested thorugh the [facility].      CBC    Component Value Date/Time   WBC 3.2 (L) 05/04/2024 1433   RBC 4.59 05/04/2024 1433   HGB 12.5 05/04/2024 1433   HCT 39.6 05/04/2024 1433   PLT 205 05/04/2024 1433   MCV 86.3 05/04/2024 1433   MCH 27.2 05/04/2024 1433   MCHC 31.6 05/04/2024 1433   RDW 12.8 05/04/2024 1433   LYMPHSABS 1.2 05/04/2024 1433   MONOABS 0.3 05/04/2024 1433   EOSABS 0.0 05/04/2024 1433   BASOSABS 0.0 05/04/2024 1433       Latest Ref Rng & Units 05/04/2024    2:33 PM 05/06/2023   11:08 PM 09/04/2022    2:32 PM  CMP  Glucose 70 - 99 mg/dL 79  879  891   BUN 6 - 20 mg/dL 12  11  11    Creatinine 0.44 - 1.00 mg/dL 9.03  9.11  9.02   Sodium 135 - 145 mmol/L 139  136  135   Potassium 3.5 - 5.1 mmol/L 3.9  3.2  3.6   Chloride 98 - 111 mmol/L  105  106  104   CO2 22 - 32 mmol/L 28  22  22    Calcium  8.9 - 10.3 mg/dL 8.9  9.0  9.0   Total Protein 6.5 - 8.1 g/dL 8.8  8.7  9.0   Total Bilirubin 0.0 - 1.2 mg/dL 0.5  0.6  0.8   Alkaline Phos 38 - 126 U/L 62  59  62   AST 15 - 41 U/L 31  19  28    ALT 0 - 44 U/L 13  11  19       No results found for: FERRITIN,  [id number]  Vitals:   05/19/24 1329  BP: 117/80  Pulse: 63  Resp: 16  Temp: (!) 97.5 F (36.4 C)  SpO2: 100%    Review of System:  Review of Systems  Constitutional:  Positive for malaise/fatigue.  Gastrointestinal:  Positive for constipation, diarrhea, nausea and vomiting.  Neurological:  Positive for dizziness.  Psychiatric/Behavioral:  The patient is nervous/anxious.     Physical Exam: Physical Exam Constitutional:      Appearance: Normal appearance.  HENT:     Head: Normocephalic and atraumatic.  Eyes:     Pupils: Pupils are equal, round, and reactive to light.  Cardiovascular:     Rate and Rhythm: Normal rate and regular rhythm.     Heart sounds: Normal heart sounds. No murmur heard. Pulmonary:     Effort: Pulmonary effort is normal.     Breath sounds: Normal breath sounds. No wheezing.  Chest:     Comments: Bilateral breast augmentation.  No axillary lymphadenopathy on either side. Abdominal:     General: Bowel sounds are normal. There is no distension.     Palpations: Abdomen is soft.     Tenderness: There is no abdominal tenderness.  Musculoskeletal:        General: Normal range of motion.     Cervical back: Normal range of motion.  Skin:    General: Skin is warm and dry.     Findings: No rash.  Neurological:     Mental Status: She is alert and oriented to person, place, and time.     Gait: Gait is intact.  Psychiatric:        Mood and Affect: Mood and affect normal.        Cognition and Memory: Memory normal.        Judgment: Judgment normal.      I spent 25 minutes dedicated to the care of this patient (face-to-face and  non-face-to-face) on the date of the encounter to include what is described in the assessment and plan.,  Delon Hope, NP 05/19/2024 2:58 PM "

## 2024-05-19 NOTE — Assessment & Plan Note (Addendum)
-   Reviewed results of bilateral axillary ultrasound which was BI-RADS Category 1. - Reviewed results of tumor marker CA 15-3 which was normal at 24.1. - Recommend continuing Aromasin  for 10 years.  She will keep her appointment in 6 months. -Continue Aromasin  for total of 10 years. -Labs look stable. -Breast exam unremarkable.

## 2024-05-19 NOTE — Assessment & Plan Note (Addendum)
-  Discussed the bone density results from 09/23/2022 with T-score -1.4, osteopenia. - Continue calcium  and vitamin D  supplements.  Vitamin D  level was normal at 42.5. - Plan on repeating bone density test in the next 2 to 3 years.

## 2024-07-29 ENCOUNTER — Ambulatory Visit: Payer: Self-pay

## 2024-11-10 ENCOUNTER — Inpatient Hospital Stay

## 2024-11-17 ENCOUNTER — Inpatient Hospital Stay: Admitting: Oncology
